# Patient Record
Sex: Female | Born: 1955
Health system: Southern US, Community
[De-identification: ages and names within clinical notes are randomized; demographics above are authoritative.]

## PROBLEM LIST (undated history)

## (undated) HISTORY — PX: CERVICAL DISCECTOMY: SHX98

## (undated) HISTORY — PX: TONSILLECTOMY: SUR1361

## (undated) HISTORY — PX: ABDOMINAL HYSTERECTOMY: SHX81

---

## 2005-09-01 ENCOUNTER — Ambulatory Visit: Payer: Self-pay | Admitting: Family Medicine

## 2006-10-18 ENCOUNTER — Ambulatory Visit: Payer: Self-pay | Admitting: Family Medicine

## 2009-04-03 ENCOUNTER — Ambulatory Visit: Payer: Self-pay | Admitting: Family Medicine

## 2009-12-22 LAB — LIPID PANEL: LDL CALC: 159 mg/dL

## 2010-06-12 LAB — LIPID PANEL
CHOLESTEROL: 189 mg/dL (ref 0–200)
HDL: 46 mg/dL (ref 35–70)
TRIGLYCERIDES: 83 mg/dL (ref 40–160)

## 2010-12-08 ENCOUNTER — Other Ambulatory Visit (HOSPITAL_COMMUNITY): Payer: Self-pay | Admitting: Family Medicine

## 2010-12-08 DIAGNOSIS — IMO0002 Reserved for concepts with insufficient information to code with codable children: Secondary | ICD-10-CM

## 2010-12-15 ENCOUNTER — Inpatient Hospital Stay (HOSPITAL_COMMUNITY): Admission: RE | Admit: 2010-12-15 | Payer: Self-pay | Source: Ambulatory Visit

## 2010-12-15 ENCOUNTER — Encounter (HOSPITAL_COMMUNITY): Payer: Self-pay

## 2012-07-09 DIAGNOSIS — E039 Hypothyroidism, unspecified: Secondary | ICD-10-CM | POA: Insufficient documentation

## 2012-08-06 ENCOUNTER — Telehealth: Payer: Self-pay | Admitting: *Deleted

## 2012-08-08 NOTE — Telephone Encounter (Signed)
Opened in error

## 2013-03-27 LAB — HEPATIC FUNCTION PANEL
ALT: 23 U/L (ref 7–35)
AST: 26 U/L (ref 13–35)
Alkaline Phosphatase: 112 U/L (ref 25–125)
BILIRUBIN, TOTAL: 0.3 mg/dL

## 2013-03-27 LAB — BASIC METABOLIC PANEL
BUN: 10 mg/dL (ref 4–21)
Creatinine: 1.2 mg/dL — AB (ref <=1.1)
Glucose: 95 mg/dL
POTASSIUM: 4.5 mmol/L (ref 3.4–5.3)
Sodium: 137 mmol/L (ref 137–147)

## 2013-04-22 ENCOUNTER — Ambulatory Visit: Payer: Self-pay | Admitting: Family Medicine

## 2013-10-01 LAB — CBC AND DIFFERENTIAL
HEMATOCRIT: 38 % (ref 36–46)
Hemoglobin: 12.9 g/dL (ref 12.0–16.0)
Neutrophils Absolute: 62 /uL
Platelets: 260 10*3/uL (ref 150–399)
WBC: 5.5 10^3/mL

## 2013-10-01 LAB — TSH: TSH: 1 u[IU]/mL (ref <=5.90)

## 2013-10-31 ENCOUNTER — Emergency Department: Payer: Self-pay | Admitting: Emergency Medicine

## 2013-10-31 LAB — URINALYSIS, COMPLETE
BILIRUBIN, UR: NEGATIVE
Bacteria: NONE SEEN
Blood: NEGATIVE
Glucose,UR: NEGATIVE mg/dL (ref 0–75)
LEUKOCYTE ESTERASE: NEGATIVE
NITRITE: NEGATIVE
PH: 6 (ref 4.5–8.0)
Protein: NEGATIVE
RBC,UR: 1 /HPF (ref 0–5)
Specific Gravity: 1.005 (ref 1.003–1.030)

## 2013-10-31 LAB — CBC
HCT: 40.1 % (ref 35.0–47.0)
HGB: 13.5 g/dL (ref 12.0–16.0)
MCH: 30.3 pg (ref 26.0–34.0)
MCHC: 33.7 g/dL (ref 32.0–36.0)
MCV: 90 fL (ref 80–100)
PLATELETS: 228 10*3/uL (ref 150–440)
RBC: 4.46 10*6/uL (ref 3.80–5.20)
RDW: 13.2 % (ref 11.5–14.5)
WBC: 7.6 10*3/uL (ref 3.6–11.0)

## 2013-10-31 LAB — COMPREHENSIVE METABOLIC PANEL
ANION GAP: 12 (ref 7–16)
Albumin: 3.8 g/dL (ref 3.4–5.0)
Alkaline Phosphatase: 78 U/L
BUN: 11 mg/dL (ref 7–18)
Bilirubin,Total: 0.7 mg/dL (ref 0.2–1.0)
CALCIUM: 9.4 mg/dL (ref 8.5–10.1)
CO2: 21 mmol/L (ref 21–32)
Chloride: 107 mmol/L (ref 98–107)
Creatinine: 1.39 mg/dL — ABNORMAL HIGH (ref 0.60–1.30)
GFR CALC AF AMER: 50 — AB
GFR CALC NON AF AMER: 42 — AB
GLUCOSE: 88 mg/dL (ref 65–99)
Osmolality: 278 (ref 275–301)
Potassium: 3.6 mmol/L (ref 3.5–5.1)
SGOT(AST): 20 U/L (ref 15–37)
SGPT (ALT): 15 U/L
SODIUM: 140 mmol/L (ref 136–145)
TOTAL PROTEIN: 7.3 g/dL (ref 6.4–8.2)

## 2013-10-31 LAB — TROPONIN I

## 2014-06-23 DIAGNOSIS — F428 Other obsessive-compulsive disorder: Secondary | ICD-10-CM | POA: Insufficient documentation

## 2014-06-23 DIAGNOSIS — E559 Vitamin D deficiency, unspecified: Secondary | ICD-10-CM | POA: Insufficient documentation

## 2014-06-23 DIAGNOSIS — E7800 Pure hypercholesterolemia, unspecified: Secondary | ICD-10-CM | POA: Insufficient documentation

## 2014-06-23 DIAGNOSIS — E538 Deficiency of other specified B group vitamins: Secondary | ICD-10-CM | POA: Insufficient documentation

## 2014-06-23 DIAGNOSIS — F329 Major depressive disorder, single episode, unspecified: Secondary | ICD-10-CM | POA: Insufficient documentation

## 2014-06-23 DIAGNOSIS — F419 Anxiety disorder, unspecified: Secondary | ICD-10-CM | POA: Insufficient documentation

## 2014-06-23 DIAGNOSIS — M2550 Pain in unspecified joint: Secondary | ICD-10-CM | POA: Insufficient documentation

## 2014-06-23 DIAGNOSIS — F32A Depression, unspecified: Secondary | ICD-10-CM | POA: Insufficient documentation

## 2014-06-23 DIAGNOSIS — M5412 Radiculopathy, cervical region: Secondary | ICD-10-CM | POA: Insufficient documentation

## 2014-06-23 DIAGNOSIS — I341 Nonrheumatic mitral (valve) prolapse: Secondary | ICD-10-CM | POA: Insufficient documentation

## 2014-06-23 DIAGNOSIS — G4700 Insomnia, unspecified: Secondary | ICD-10-CM | POA: Insufficient documentation

## 2014-06-23 DIAGNOSIS — E039 Hypothyroidism, unspecified: Secondary | ICD-10-CM | POA: Insufficient documentation

## 2014-06-23 DIAGNOSIS — D649 Anemia, unspecified: Secondary | ICD-10-CM | POA: Insufficient documentation

## 2014-07-01 DIAGNOSIS — R102 Pelvic and perineal pain: Secondary | ICD-10-CM

## 2014-07-01 DIAGNOSIS — G8929 Other chronic pain: Secondary | ICD-10-CM | POA: Insufficient documentation

## 2014-08-05 ENCOUNTER — Other Ambulatory Visit: Payer: Self-pay | Admitting: Family Medicine

## 2014-08-05 DIAGNOSIS — M2550 Pain in unspecified joint: Secondary | ICD-10-CM

## 2014-08-05 MED ORDER — OXYCODONE HCL 10 MG PO TABS: 10.0000 mg | ORAL_TABLET | ORAL | Status: DC | PRN

## 2014-08-05 NOTE — Telephone Encounter (Signed)
[email or web address]

## 2014-08-05 NOTE — Telephone Encounter (Signed)
Pt contacted office for refill request on the following medications:  Oxycodone HCl 10 MG TABS.  ZO#[phone]/WJB#[phone]/MJ   This is a Dr Sullivan LoneGilbert pt.

## 2014-08-05 NOTE — Telephone Encounter (Signed)
Prescription printed. Please notify patient it is ready for pick up. Thanks- Dr. Elorah Dewing.  

## 2014-08-15 ENCOUNTER — Telehealth: Payer: Self-pay | Admitting: Family Medicine

## 2014-08-20 ENCOUNTER — Ambulatory Visit (INDEPENDENT_AMBULATORY_CARE_PROVIDER_SITE_OTHER): Payer: Medicare PPO | Admitting: Family Medicine

## 2014-08-20 ENCOUNTER — Encounter: Payer: Self-pay | Admitting: Family Medicine

## 2014-08-20 VITALS — BP 128/74 | HR 68 | Resp 16 | Ht 66.0 in | Wt 175.0 lb

## 2014-08-20 DIAGNOSIS — J0191 Acute recurrent sinusitis, unspecified: Secondary | ICD-10-CM | POA: Diagnosis not present

## 2014-08-20 DIAGNOSIS — F32A Depression, unspecified: Secondary | ICD-10-CM

## 2014-08-20 DIAGNOSIS — F329 Major depressive disorder, single episode, unspecified: Secondary | ICD-10-CM | POA: Diagnosis not present

## 2014-08-20 MED ORDER — CLARITHROMYCIN 500 MG PO TABS: 500.0000 mg | ORAL_TABLET | Freq: Two times a day (BID) | ORAL | Status: DC

## 2014-08-20 NOTE — Progress Notes (Signed)
Patient ID: Hannah Vance, female   DOB: 07/18/1955, 59 y.o.   MRN: [id number]   Hannah Vance  MRN: [id number] DOB: 1956/01/25  Subjective:  HPI  1. Depression Patient is a 59 year old female who presents for follow up of depression.  She has been seeing Dr. Imogene Burn.  She reports that she is on new medications and states she is feeling so much better.  She states she still has some bad days but overall she feels well.    2. Acute recurrent sinusitis, unspecified location Patient has been having recurrent sinus infections.  This current episode started 3 weeks ago.  She has been treated with antibiotics on 2 separate occasion.  She complains of sinus pain and pressure.  She also complains tht her ears are bothering her.    Patient Active Problem List   Diagnosis Date Noted  . Absolute anemia 06/23/2014  . Anxiety 06/23/2014  . Pain in joint 06/23/2014  . 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 Clinical depression 06/23/2014  . Hypercholesteremia 06/23/2014  . Adult hypothyroidism 06/23/2014  . Cannot sleep 06/23/2014  . Billowing mitral valve 06/23/2014  . Anancastic neurosis 06/23/2014  . Avitaminosis D 06/23/2014    History reviewed. No pertinent past medical history.  History   Social History  . Marital Status: Divorced    Spouse Name: N/A  . Number of Children: N/A  . Years of Education: N/A   Occupational History  . Not on file.   Social History Main Topics  . Smoking status: Never Smoker   . Smokeless tobacco: Not on file  . Alcohol Use: 0.0 oz/week    0 Standard drinks or equivalent per week  . Drug Use: No  . Sexual Activity: Not on file   Other Topics Concern  . Not on file   Social History Narrative    Outpatient Prescriptions Prior to Visit  Medication Sig Dispense Refill  . aspirin 81 MG EC tablet Take by mouth.    Marland Kitchen L-Methylfolate (DEPLIN) 15 MG TABS Take by mouth.    . levothyroxine  (SYNTHROID, LEVOTHROID) 25 MCG tablet Take by mouth.    Marland Kitchen LORazepam (ATIVAN) 1 MG tablet Take by mouth.    . Oxycodone HCl 10 MG TABS Take 1 tablet (10 mg total) by mouth every 4 (four) hours as needed. 200 tablet 0  . zolpidem (AMBIEN) 10 MG tablet Take by mouth.    Marland Kitchen buPROPion (WELLBUTRIN SR) 200 MG 12 hr tablet Take by mouth.    . desvenlafaxine (PRISTIQ) 50 MG 24 hr tablet Take by mouth.     No facility-administered medications prior to visit.    Allergies  Allergen Reactions  . Amoxicillin   . Morphine Other (See Comments)    Trouble walking, heavy legs  . Sulfa Antibiotics Other (See Comments)    burning  . Septra  [Sulfamethoxazole-Trimethoprim]   . Methadone Nausea And Vomiting    Review of Systems  Constitutional: Negative for fever, chills, weight loss, malaise/fatigue and diaphoresis.  HENT: Positive for congestion, ear discharge and ear pain. Negative for hearing loss, nosebleeds, sore throat and tinnitus.   Eyes: Positive for pain and discharge. Negative for redness.  Respiratory: Negative.  Negative for cough, hemoptysis, sputum production, shortness of breath and wheezing.   Cardiovascular: Positive for palpitations. Negative for chest pain, orthopnea, claudication, leg swelling and PND.  Gastrointestinal: Negative.   Genitourinary: Negative.   Musculoskeletal: Positive for back pain.  Neurological: Positive for weakness and headaches (sinus pressure and pain).  Endo/Heme/Allergies: Negative.   Psychiatric/Behavioral: Positive for depression and memory loss. Negative for hallucinations and substance abuse. The patient is nervous/anxious and has insomnia.    Objective:  BP 128/74 mmHg  Pulse 68  Resp 16  Ht 5\' 6"  (1.676 m)  Wt 175 lb (79.379 kg)  BMI 28.26 kg/m2  Physical Exam  Constitutional: She is oriented to person, place, and time and well-developed, well-nourished, and in no distress.  HENT:  Head: Normocephalic and atraumatic.  Right Ear: External  ear normal.  Left Ear: External ear normal.  Nose: Nose normal.  Eyes: Conjunctivae are normal. Pupils are equal, round, and reactive to light.  Neck: Normal range of motion. Neck supple.  Cardiovascular: Normal rate, regular rhythm and normal heart sounds.   Pulmonary/Chest: Effort normal and breath sounds normal.  Abdominal: Soft. Bowel sounds are normal.  Neurological: She is alert and oriented to person, place, and time.  Skin: Skin is warm and dry.  Psychiatric: Mood, memory, affect and judgment normal.    Assessment and Plan :  Depression   markedly improved with the stimulant. She was treated several years ago with Provigil when this was I think her depression going forward should always include a stimulant as part of the regimen. Assuming that This is safe for the patient. This is probably the best she felt than 10-15 years. [facility] Chronic sinusitis Biaxin for at least 10 days and may need ENT referral if she does not resolve.  Julieanne Mansonichard Gilbert MD [facility] 08/20/2014 12:19 PM

## 2014-09-15 ENCOUNTER — Other Ambulatory Visit: Payer: Self-pay | Admitting: Family Medicine

## 2014-11-03 ENCOUNTER — Ambulatory Visit (INDEPENDENT_AMBULATORY_CARE_PROVIDER_SITE_OTHER): Payer: Medicare PPO | Admitting: Family Medicine

## 2014-11-03 ENCOUNTER — Encounter: Payer: Self-pay | Admitting: Family Medicine

## 2014-11-03 VITALS — BP 124/68 | HR 68 | Temp 98.9°F | Resp 14 | Wt 178.0 lb

## 2014-11-03 DIAGNOSIS — Z23 Encounter for immunization: Secondary | ICD-10-CM

## 2014-11-03 NOTE — Progress Notes (Signed)
Patient ID: Hannah Vance, female   DOB: 30-Dec-1955, 59 y.o.   MRN: [id number]    Subjective:  HPI  Depression follow up: Patient is here for 2 months follow up. Patient was doing well and saw Dr. Imogene Burn on Thursday September 22nd and her medications were changed. She can not tell the difference as of right now.  Patient is feeling more depressed today.  Prior to Admission medications   Medication Sig Start Date End Date Taking? Authorizing Provider  aspirin 81 MG EC tablet Take by mouth.   Yes Historical Provider, MD  clarithromycin (BIAXIN) 500 MG tablet Take 1 tablet (500 mg total) by mouth 2 (two) times daily. 08/20/14  Yes Rain Wilhide Hulen Shouts., MD  L-Methylfolate (DEPLIN) 15 MG TABS Take by mouth.   Yes Historical Provider, MD  levothyroxine (SYNTHROID, LEVOTHROID) 25 MCG tablet Take by mouth. 06/16/14  Yes Historical Provider, MD  LORazepam (ATIVAN) 1 MG tablet Take by mouth.   Yes Historical Provider, MD  methylphenidate (RITALIN) 10 MG tablet Take 10 mg by mouth 2 (two) times daily. Patient is uncertain of the strength   Yes Historical Provider, MD  Oxycodone HCl 10 MG TABS Take 1 tablet (10 mg total) by mouth every 4 (four) hours as needed. 08/05/14  Yes Lorie Phenix, MD  Vortioxetine HBr (BRINTELLIX) 10 MG TABS Take 1 tablet by mouth daily. Patient is uncertain of the strength   Yes Historical Provider, MD  zolpidem (AMBIEN) 10 MG tablet TAKE 1 TABLET AT BEDTIME AS NEEDED FOR SLEEP 09/16/14  Yes Maple Hudson., MD    Patient Active Problem List   Diagnosis Date Noted  . Absolute anemia 06/23/2014  . Anxiety 06/23/2014  . Pain in joint 06/23/2014  . 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 Clinical depression 06/23/2014  . Hypercholesteremia 06/23/2014  . Adult hypothyroidism 06/23/2014  . Cannot sleep 06/23/2014  . Billowing mitral valve 06/23/2014  . Anancastic neurosis 06/23/2014  . Avitaminosis D 06/23/2014    No  past medical history on file.  Social History   Social History  . Marital Status: Divorced    Spouse Name: N/A  . Number of Children: N/A  . Years of Education: N/A   Occupational History  . Not on file.   Social History Main Topics  . Smoking status: Never Smoker   . Smokeless tobacco: Never Used  . Alcohol Use: 0.0 oz/week    0 Standard drinks or equivalent per week  . Drug Use: No  . Sexual Activity: No   Other Topics Concern  . Not on file   Social History Narrative    Allergies  Allergen Reactions  . Amoxicillin   . Morphine Other (See Comments)    Trouble walking, heavy legs  . Sulfa Antibiotics Other (See Comments)    burning  . Septra  [Sulfamethoxazole-Trimethoprim]   . Methadone Nausea And Vomiting    Review of Systems  Constitutional: Negative.   Respiratory: Negative.   Cardiovascular: Negative.   Gastrointestinal: Positive for nausea.  Musculoskeletal: Positive for back pain and joint pain.  Psychiatric/Behavioral: Positive for depression. The patient is nervous/anxious.      There is no immunization history on file for this patient. Objective:  BP 124/68 mmHg  Pulse 68  Temp(Src) 98.9 F (37.2 C)  Resp 14  Wt 178 lb (80.74 kg)  Physical Exam  Constitutional: She is oriented to person, place, and time and well-developed, well-nourished, and in  no distress.  HENT:  Head: Normocephalic and atraumatic.  Right Ear: External ear normal.  Left Ear: External ear normal.  Nose: Nose normal.  Eyes: Conjunctivae are normal.  Neck: Neck supple.  Cardiovascular: Normal rate, regular rhythm and normal heart sounds.   Pulmonary/Chest: Effort normal and breath sounds normal.  Abdominal: Soft.  Neurological: She is alert and oriented to person, place, and time.  Skin: Skin is warm and dry.  Psychiatric: Mood, memory, affect and judgment normal.    Lab Results  Component Value Date   WBC 7.6 10/31/2013   HGB 13.5 10/31/2013   HCT 40.1  10/31/2013   PLT 228 10/31/2013   GLUCOSE 88 10/31/2013   CHOL 189 06/12/2010   TRIG 83 06/12/2010   HDL 46 06/12/2010   LDLCALC 159 12/22/2009   TSH 1.00 10/01/2013    CMP     Component Value Date/Time   NA 140 10/31/2013 1648   NA 137 03/27/2013   K 3.6 10/31/2013 1648   K 4.5 03/27/2013   CL 107 10/31/2013 1648   CO2 21 10/31/2013 1648   GLUCOSE 88 10/31/2013 1648   BUN 11 10/31/2013 1648   BUN 10 03/27/2013   CREATININE 1.39* 10/31/2013 1648   CREATININE 1.2* 03/27/2013   CALCIUM 9.4 10/31/2013 1648   PROT 7.3 10/31/2013 1648   ALBUMIN 3.8 10/31/2013 1648   AST 20 10/31/2013 1648   AST 26 03/27/2013   ALT 15 10/31/2013 1648   ALT 23 03/27/2013   ALKPHOS 78 10/31/2013 1648   ALKPHOS 112 03/27/2013   BILITOT 0.7 10/31/2013 1648   GFRNONAA 42* 10/31/2013 1648   GFRAA 50* 10/31/2013 1648    Assessment and Plan :  1. Need for influenza vaccination  - Flu Vaccine QUAD 36+ mos IM 2. Major depressive disorder Patient continues to stay very upset about family situation. Her father died this past year and her mother is in failing health. She does not like how her sister's handle things and this bothers her. We discussed that she can only control her behavior. I really think she would benefit from counseling.we will take over her depression unless this gets worse. Dr. Imogene Burn is evidently leaving town.she is always responded in the past as stimulants. May need to increase her Ritalin.more than half the time spent in counseling with this patient. 3. Chronic pain syndrome 4. Hypothyroidism  [name] MD [facility] 11/03/2014 1:47 PM

## 2014-12-01 ENCOUNTER — Telehealth: Payer: Self-pay | Admitting: Family Medicine

## 2014-12-01 DIAGNOSIS — M2550 Pain in unspecified joint: Secondary | ICD-10-CM

## 2014-12-01 MED ORDER — OXYCODONE HCL 10 MG PO TABS: 10.0000 mg | ORAL_TABLET | ORAL | Status: DC | PRN

## 2014-12-01 NOTE — Telephone Encounter (Signed)
Pt contacted office for refill request on the following medications: Oxycodone HCl 10 MG TABS. This is a pt of Dr. Elisabeth CaraGilbert's. Thanks TNP

## 2014-12-01 NOTE — Telephone Encounter (Signed)
Please see below thank you-aa 

## 2014-12-15 ENCOUNTER — Other Ambulatory Visit: Payer: Self-pay | Admitting: Family Medicine

## 2014-12-24 ENCOUNTER — Ambulatory Visit (INDEPENDENT_AMBULATORY_CARE_PROVIDER_SITE_OTHER): Payer: Medicare PPO | Admitting: Family Medicine

## 2014-12-24 VITALS — BP 124/86 | HR 84 | Resp 16 | Wt 185.0 lb

## 2014-12-24 DIAGNOSIS — F419 Anxiety disorder, unspecified: Secondary | ICD-10-CM

## 2014-12-24 DIAGNOSIS — F329 Major depressive disorder, single episode, unspecified: Secondary | ICD-10-CM | POA: Diagnosis not present

## 2014-12-24 DIAGNOSIS — F32A Depression, unspecified: Secondary | ICD-10-CM

## 2014-12-24 MED ORDER — VORTIOXETINE HBR 10 MG PO TABS: 1.0000 | ORAL_TABLET | Freq: Every day | ORAL | Status: DC

## 2014-12-24 MED ORDER — CLONAZEPAM 0.5 MG PO TABS: ORAL_TABLET | ORAL | Status: DC

## 2014-12-24 NOTE — Progress Notes (Signed)
Patient ID: Hannah Vance, female   DOB: 28-Dec-1955, 59 y.o.   MRN: [id number]   Hannah Vance  MRN: [id number] DOB: Jul 28, 1955  Subjective:  HPI   1. Depression The patient is a 59 year old female who presents for follow up of depression.  She has been seeing Dr. Imogene Burn and he is closing his practice and needs for [place] to evaluate her medications and follow her. We discussed this at length. Patient Active Problem List   Diagnosis Date Noted  . Absolute anemia 06/23/2014  . Anxiety 06/23/2014  . Pain in joint 06/23/2014  . 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 Clinical depression 06/23/2014  . Hypercholesteremia 06/23/2014  . Adult hypothyroidism 06/23/2014  . Cannot sleep 06/23/2014  . Billowing mitral valve 06/23/2014  . Anancastic neurosis 06/23/2014  . Avitaminosis D 06/23/2014    No past medical history on file.  Social History   Social History  . Marital Status: Divorced    Spouse Name: N/A  . Number of Children: N/A  . Years of Education: N/A   Occupational History  . Not on file.   Social History Main Topics  . Smoking status: Never Smoker   . Smokeless tobacco: Never Used  . Alcohol Use: 0.0 oz/week    0 Standard drinks or equivalent per week  . Drug Use: No  . Sexual Activity: No   Other Topics Concern  . Not on file   Social History Narrative    Outpatient Prescriptions Prior to Visit  Medication Sig Dispense Refill  . aspirin 81 MG EC tablet Take by mouth.    Marland Kitchen L-Methylfolate (DEPLIN) 15 MG TABS Take by mouth.    . levothyroxine (SYNTHROID, LEVOTHROID) 25 MCG tablet TAKE 1 TABLET BY MOUTH DAILY 30 tablet 0  . methylphenidate (RITALIN) 10 MG tablet Take 10 mg by mouth 2 (two) times daily. Patient is uncertain of the strength    . Oxycodone HCl 10 MG TABS Take 1 tablet (10 mg total) by mouth every 4 (four) hours as needed. 200 tablet 0  . Vortioxetine HBr (BRINTELLIX) 10 MG TABS Take 1 tablet by  mouth daily. Patient is uncertain of the strength    . zolpidem (AMBIEN) 10 MG tablet TAKE 1 TABLET AT BEDTIME AS NEEDED FOR SLEEP 30 tablet 5  . clarithromycin (BIAXIN) 500 MG tablet Take 1 tablet (500 mg total) by mouth 2 (two) times daily. 20 tablet 2  . LORazepam (ATIVAN) 1 MG tablet Take by mouth.     No facility-administered medications prior to visit.    Allergies  Allergen Reactions  . Amoxicillin   . Morphine Other (See Comments)    Trouble walking, heavy legs  . Sulfa Antibiotics Other (See Comments)    burning  . Septra  [Sulfamethoxazole-Trimethoprim]   . Methadone Nausea And Vomiting    Review of Systems  Constitutional: Positive for malaise/fatigue. Negative for fever, chills, weight loss and diaphoresis.  Respiratory: Positive for shortness of breath. Negative for cough, hemoptysis, sputum production and wheezing.   Cardiovascular: Positive for palpitations and leg swelling. Negative for chest pain and orthopnea.  Neurological: Positive for dizziness, weakness and headaches.   Objective:  BP 124/86 mmHg  Pulse 84  Resp 16  Wt 185 lb (83.915 kg)  Physical Exam  Assessment and Plan :  1. Depression Patient is going to d/c her Deplin - Vortioxetine HBr (TRINTELLIX) 10 MG TABS; Take 1 tablet (10 mg total) by mouth  daily.  Dispense: 30 tablet; Refill: 12 More than 50% of time spent in counselling. 2. Acute anxiety Will switch her Lorazepam to Clonazepam - clonazePAM (KLONOPIN) 0.5 MG tablet; 1-2 three times daily  Dispense: 180 tablet; Refill: 5   Julieanne Mansonichard Gilbert MD [facility] 12/24/2014 4:46 PM

## 2014-12-29 ENCOUNTER — Telehealth: Payer: Self-pay | Admitting: Family Medicine

## 2014-12-29 DIAGNOSIS — J309 Allergic rhinitis, unspecified: Secondary | ICD-10-CM

## 2014-12-29 NOTE — Telephone Encounter (Signed)
Pt called and is having sinus congestion, sneezing, she is using simply saline.  Is there anything else she can take.  Either RX or OTC.  [facility]  Call back is  [phone]  Thanks Barth Kirkseri

## 2014-12-29 NOTE — Telephone Encounter (Signed)
Any suggestions?-aa

## 2014-12-30 MED ORDER — FLUTICASONE PROPIONATE 50 MCG/ACT NA SUSP: 2.0000 | Freq: Every day | NASAL | Status: DC

## 2014-12-30 NOTE — Telephone Encounter (Signed)
Fluticasone nasal spray

## 2014-12-30 NOTE — Telephone Encounter (Signed)
Med sent in. Pt informed. 

## 2015-01-13 ENCOUNTER — Ambulatory Visit (INDEPENDENT_AMBULATORY_CARE_PROVIDER_SITE_OTHER): Payer: Medicare PPO | Admitting: Family Medicine

## 2015-01-13 VITALS — BP 140/86 | HR 88 | Temp 98.8°F | Resp 16 | Wt 183.0 lb

## 2015-01-13 DIAGNOSIS — F32A Depression, unspecified: Secondary | ICD-10-CM

## 2015-01-13 DIAGNOSIS — M2550 Pain in unspecified joint: Secondary | ICD-10-CM | POA: Diagnosis not present

## 2015-01-13 DIAGNOSIS — F329 Major depressive disorder, single episode, unspecified: Secondary | ICD-10-CM | POA: Diagnosis not present

## 2015-01-13 MED ORDER — METHYLPHENIDATE HCL 10 MG PO TABS: 10.0000 mg | ORAL_TABLET | Freq: Two times a day (BID) | ORAL | Status: DC

## 2015-01-13 MED ORDER — OXYCODONE HCL 10 MG PO TABS: 10.0000 mg | ORAL_TABLET | ORAL | Status: DC | PRN

## 2015-01-13 MED ORDER — LEVOTHYROXINE SODIUM 25 MCG PO TABS: 25.0000 ug | ORAL_TABLET | Freq: Every day | ORAL | Status: DC

## 2015-01-13 NOTE — Progress Notes (Signed)
Patient ID: Hannah Vance, female   DOB: 1955/02/19, 59 y.o.   MRN: [id number]   ADELYNA BROCKMAN  MRN: [id number] DOB: Feb 16, 1955  Subjective:  HPI   1. Depression The patient is a 59 year old female who is here for follow up on her depression.  She was last seen on 12/24/14.  At that time it was decided that she would discontinue her Deplin and start Trintellix.  She also had her Lorazepam switched to Clonazepam.  She reports that she is tolerating these changes well.  Patient Active Problem List   Diagnosis Date Noted  . Absolute anemia 06/23/2014  . Anxiety 06/23/2014  . Pain in joint 06/23/2014  . 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 Clinical depression 06/23/2014  . Hypercholesteremia 06/23/2014  . Adult hypothyroidism 06/23/2014  . Cannot sleep 06/23/2014  . Billowing mitral valve 06/23/2014  . Anancastic neurosis 06/23/2014  . Avitaminosis D 06/23/2014    No past medical history on file.  Social History   Social History  . Marital Status: Divorced    Spouse Name: N/A  . Number of Children: N/A  . Years of Education: N/A   Occupational History  . Not on file.   Social History Main Topics  . Smoking status: Never Smoker   . Smokeless tobacco: Never Used  . Alcohol Use: 0.0 oz/week    0 Standard drinks or equivalent per week  . Drug Use: No  . Sexual Activity: No   Other Topics Concern  . Not on file   Social History Narrative    Outpatient Prescriptions Prior to Visit  Medication Sig Dispense Refill  . aspirin 81 MG EC tablet Take by mouth.    . clonazePAM (KLONOPIN) 0.5 MG tablet 1-2 three times daily 180 tablet 5  . fluticasone (FLONASE) 50 MCG/ACT nasal spray Place 2 sprays into both nostrils daily. 16 g 12  . levothyroxine (SYNTHROID, LEVOTHROID) 25 MCG tablet TAKE 1 TABLET BY MOUTH DAILY 30 tablet 0  . methylphenidate (RITALIN) 10 MG tablet Take 10 mg by mouth 2 (two) times daily. Patient is  uncertain of the strength    . Oxycodone HCl 10 MG TABS Take 1 tablet (10 mg total) by mouth every 4 (four) hours as needed. 200 tablet 0  . Vortioxetine HBr (TRINTELLIX) 10 MG TABS Take 1 tablet (10 mg total) by mouth daily. 30 tablet 12  . zolpidem (AMBIEN) 10 MG tablet TAKE 1 TABLET AT BEDTIME AS NEEDED FOR SLEEP 30 tablet 5   No facility-administered medications prior to visit.    Allergies  Allergen Reactions  . Amoxicillin   . Morphine Other (See Comments)    Trouble walking, heavy legs  . Sulfa Antibiotics Other (See Comments)    burning  . Septra  [Sulfamethoxazole-Trimethoprim]   . Methadone Nausea And Vomiting    Review of Systems  Constitutional: Negative for fever and chills.  Respiratory: Negative for shortness of breath and wheezing.   Cardiovascular: Negative for chest pain and orthopnea.  Gastrointestinal: Positive for abdominal pain.  Musculoskeletal: Positive for back pain.  Neurological: Negative for weakness.  Psychiatric/Behavioral: Positive for depression. Negative for suicidal ideas, hallucinations, memory loss and substance abuse. The patient is nervous/anxious. The patient does not have insomnia.    Objective:  BP 140/86 mmHg  Pulse 88  Temp(Src) 98.8 F (37.1 C) (Oral)  Resp 16  Wt 183 lb (83.008 kg)  Physical Exam  Constitutional: She is well-developed, well-nourished,  and in no distress.  HENT:  Head: Normocephalic.  Eyes: Pupils are equal, round, and reactive to light.  Neck: Normal range of motion.  Cardiovascular: Normal rate, regular rhythm and normal heart sounds.   Pulmonary/Chest: Effort normal and breath sounds normal.  Abdominal: Soft. There is tenderness.  Normal tenderness for this pt in the abdominal wall.  Skin: Skin is warm and dry.  Psychiatric: Mood, memory, affect and judgment normal.    Assessment and Plan :  1. Depression Will follow for now. - levothyroxine (SYNTHROID, LEVOTHROID) 25 MCG tablet; Take 1 tablet (25 mcg  total) by mouth daily.  Dispense: 30 tablet; Refill: 12 - methylphenidate (RITALIN) 10 MG tablet; Take 1 tablet (10 mg total) by mouth 2 (two) times daily. Patient is uncertain of the strength  Dispense: 60 tablet; Refill: 0  2. Pain in joint Chronic pain requiring less narcotic over time - Oxycodone HCl 10 MG TABS; Take 1 tablet (10 mg total) by mouth every 4 (four) hours as needed.  Dispense: 200 tablet; Refill: 0 3.chronic Abdominal Pain/Tenderness 4.Chronic Anxiety Major issue for pt. May need referral back to a new psychiatrist in future. Mor than 50% of visit spent in counselling.   Julieanne Mansonichard Kaliope Quinonez MD [facility] 01/13/2015 2:51 PM

## 2015-01-14 ENCOUNTER — Other Ambulatory Visit: Payer: Self-pay | Admitting: Family Medicine

## 2015-01-26 ENCOUNTER — Telehealth: Payer: Self-pay | Admitting: Family Medicine

## 2015-01-26 NOTE — Telephone Encounter (Signed)
Sucralfate ok to take.

## 2015-01-26 NOTE — Telephone Encounter (Signed)
Pt informed and voiced understanding of results. 

## 2015-01-26 NOTE — Telephone Encounter (Signed)
Pt called saying she has been having stomach problems and migraines lately because of stress.  She was taking generic sucralfate 1mg  for her stomach and still has some at home but doesn't know whether she can take it or not because  Dr. Claudie Fishermanhin has her on mediation and she is afraid of an interaction.    Could someone please call her back.  [phone]  Thanks, Barth Kirkseri

## 2015-01-26 NOTE — Telephone Encounter (Signed)
Please review,-aa 

## 2015-02-24 ENCOUNTER — Encounter: Payer: Self-pay | Admitting: Family Medicine

## 2015-03-03 ENCOUNTER — Ambulatory Visit: Payer: PPO | Admitting: Family Medicine

## 2015-03-05 ENCOUNTER — Other Ambulatory Visit: Payer: Self-pay

## 2015-03-05 ENCOUNTER — Telehealth: Payer: Self-pay | Admitting: Family Medicine

## 2015-03-05 DIAGNOSIS — F329 Major depressive disorder, single episode, unspecified: Secondary | ICD-10-CM

## 2015-03-05 DIAGNOSIS — F32A Depression, unspecified: Secondary | ICD-10-CM

## 2015-03-05 MED ORDER — VORTIOXETINE HBR 20 MG PO TABS: 20.0000 mg | ORAL_TABLET | Freq: Every day | ORAL | Status: DC

## 2015-03-05 NOTE — Telephone Encounter (Signed)
Spoke with patient, not sure who called could be the appointment notification phone call. No messages in the chart. -aa

## 2015-03-05 NOTE — Telephone Encounter (Signed)
Pt states she's returning a call  from one of Dr. Wonda Olds nurse's maybe from [place],pt really can't remember who, pt states she  thinks it might be about her medication,pt states she would like for Ana to return her call.  Thanks CC

## 2015-03-05 NOTE — Telephone Encounter (Signed)
She called and spoke with insurance and they will send [place] the form to get her medication approved for Methalphenidate. Awaiting forms to come over.-aa

## 2015-03-05 NOTE — Telephone Encounter (Signed)
Pt is request a call back from Tobi Bastos today if possible .  ZO#[phone]/[id number]

## 2015-03-09 ENCOUNTER — Ambulatory Visit (INDEPENDENT_AMBULATORY_CARE_PROVIDER_SITE_OTHER): Payer: PPO | Admitting: Family Medicine

## 2015-03-09 VITALS — BP 158/96 | HR 92 | Temp 98.1°F | Resp 18 | Wt 182.0 lb

## 2015-03-09 DIAGNOSIS — E7800 Pure hypercholesterolemia, unspecified: Secondary | ICD-10-CM | POA: Diagnosis not present

## 2015-03-09 DIAGNOSIS — G4700 Insomnia, unspecified: Secondary | ICD-10-CM

## 2015-03-09 DIAGNOSIS — M545 Low back pain, unspecified: Secondary | ICD-10-CM

## 2015-03-09 DIAGNOSIS — F419 Anxiety disorder, unspecified: Secondary | ICD-10-CM

## 2015-03-09 DIAGNOSIS — G43009 Migraine without aura, not intractable, without status migrainosus: Secondary | ICD-10-CM | POA: Diagnosis not present

## 2015-03-09 DIAGNOSIS — F329 Major depressive disorder, single episode, unspecified: Secondary | ICD-10-CM | POA: Diagnosis not present

## 2015-03-09 DIAGNOSIS — E038 Other specified hypothyroidism: Secondary | ICD-10-CM | POA: Diagnosis not present

## 2015-03-09 DIAGNOSIS — F32A Depression, unspecified: Secondary | ICD-10-CM

## 2015-03-09 MED ORDER — SUMATRIPTAN SUCCINATE 50 MG PO TABS: 50.0000 mg | ORAL_TABLET | Freq: Once | ORAL | Status: DC

## 2015-03-09 NOTE — Progress Notes (Signed)
Patient ID: Hannah Vance, female   DOB: [birth year], 60 y.o.   MRN: [id number]    Subjective:  HPI  Patient has had trouble with abdominal pain for months off and on. She states it is usually an issue when she feels constipated due to her medications she takes. She does take Miralax and when she gets the pain in her abdominal sided she will take extra Miralax. No vomiting with it.  Patient also has had a headache for 3 days now, mainly located on the left side of her head. She has a history of migraines but has not had to take medications for this in years. She does mention that her headaches got worse when Dr. Imogene Burn put her on Pristiq but due to other side effects she is no longer on that medication it was switched but she still gets headaches just not as often. She knows she had a migraine around Christmas time she had aura and everything. Headache she is having now noise and light, everything per patient is aggravating it. She has not taking anything for the pain besides Aspirin 81 mg.  Prior to Admission medications   Medication Sig Start Date End Date Taking? Authorizing Provider  aspirin 81 MG EC tablet Take by mouth.   Yes Historical Provider, MD  clonazePAM (KLONOPIN) 0.5 MG tablet 1-2 three times daily 12/24/14  Yes Doctor Sheahan Hulen Shouts., MD  fluticasone [facility]) 50 MCG/ACT nasal spray Place 2 sprays into both nostrils daily. 12/30/14  Yes Joscelynn Brutus Hulen Shouts., MD  levothyroxine (SYNTHROID, LEVOTHROID) 25 MCG tablet Take 1 tablet (25 mcg total) by mouth daily. 01/13/15  Yes Tyrel Lex Hulen Shouts., MD  levothyroxine (SYNTHROID, LEVOTHROID) 25 MCG tablet TAKE 1 TABLET BY MOUTH DAILY 01/14/15  Yes Maple Hudson., MD  methylphenidate (RITALIN) 10 MG tablet Take 1 tablet (10 mg total) by mouth 2 (two) times daily. Patient is uncertain of the strength 01/13/15  Yes Derotha Fishbaugh Hulen Shouts., MD  methylphenidate (RITALIN) 10 MG tablet Take 1 tablet (10 mg total) by mouth 2 (two) times daily.  03/16/15  Yes Elizer Bostic Hulen Shouts., MD  methylphenidate (RITALIN) 10 MG tablet Take 1 tablet (10 mg total) by mouth 2 (two) times daily. 02/13/15  Yes Tirrell Buchberger Hulen Shouts., MD  Oxycodone HCl 10 MG TABS Take 1 tablet (10 mg total) by mouth every 4 (four) hours as needed. 01/13/15  Yes Anayia Eugene Hulen Shouts., MD  Vortioxetine HBr (TRINTELLIX) 20 MG TABS Take 20 mg by mouth daily. 03/05/15  Yes Letonia Stead Hulen Shouts., MD  zolpidem (AMBIEN) 10 MG tablet TAKE 1 TABLET AT BEDTIME AS NEEDED FOR SLEEP 09/16/14  Yes Maple Hudson., MD    Patient Active Problem List   Diagnosis Date Noted  . Absolute anemia 06/23/2014  . Anxiety 06/23/2014  . Pain in joint 06/23/2014  . 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 Clinical depression 06/23/2014  . Hypercholesteremia 06/23/2014  . Adult hypothyroidism 06/23/2014  . Cannot sleep 06/23/2014  . Billowing mitral valve 06/23/2014  . Anancastic neurosis 06/23/2014  . Avitaminosis D 06/23/2014    No past medical history on file.  Social History   Social History  . Marital Status: Divorced    Spouse Name: N/A  . Number of Children: N/A  . Years of Education: N/A   Occupational History  . Not on file.   Social History Main Topics  . Smoking status: Never Smoker   .  Smokeless tobacco: Never Used  . Alcohol Use: 0.0 oz/week    0 Standard drinks or equivalent per week  . Drug Use: No  . Sexual Activity: No   Other Topics Concern  . Not on file   Social History Narrative    Allergies  Allergen Reactions  . Amoxicillin   . Morphine Other (See Comments)    Trouble walking, heavy legs  . Sulfa Antibiotics Other (See Comments)    burning  . Septra  [Sulfamethoxazole-Trimethoprim]   . Methadone Nausea And Vomiting    Review of Systems  Constitutional: Negative.   Eyes: Negative.   Respiratory: Negative.   Cardiovascular: Negative.   Gastrointestinal: Positive for nausea and abdominal  pain.  Genitourinary: Negative.   Musculoskeletal: Positive for back pain and joint pain.  Neurological: Negative.   Psychiatric/Behavioral: Positive for depression. The patient is nervous/anxious.     Immunization History  Administered Date(s) Administered  . Influenza,inj,Quad PF,36+ Mos 11/03/2014   Objective:  BP 158/96 mmHg  Pulse 92  Temp(Src) 98.1 F (36.7 C)  Resp 18  Wt 182 lb (82.555 kg)  Physical Exam  Constitutional: She is oriented to person, place, and time and well-developed, well-nourished, and in no distress.  HENT:  Head: Normocephalic and atraumatic.  Right Ear: External ear normal.  Left Ear: External ear normal.  Nose: Nose normal.  Eyes: Conjunctivae are normal. Pupils are equal, round, and reactive to light.  Neck: Normal range of motion. Neck supple.  Cardiovascular: Normal rate, regular rhythm, normal heart sounds and intact distal pulses.   No murmur heard. Pulmonary/Chest: Effort normal and breath sounds normal. No respiratory distress. She has no wheezes.  Abdominal: Soft. Bowel sounds are normal. She exhibits no distension. There is tenderness. There is no rebound.  Musculoskeletal: Normal range of motion. She exhibits no edema or tenderness.  Neurological: She is alert and oriented to person, place, and time.  Skin: Skin is warm and dry.  Psychiatric: Memory, affect and judgment normal.    Lab Results  Component Value Date   WBC 7.6 10/31/2013   HGB 13.5 10/31/2013   HCT 40.1 10/31/2013   PLT 228 10/31/2013   GLUCOSE 88 10/31/2013   CHOL 189 06/12/2010   TRIG 83 06/12/2010   HDL 46 06/12/2010   LDLCALC 159 12/22/2009   TSH 1.00 10/01/2013    CMP     Component Value Date/Time   NA 140 10/31/2013 1648   NA 137 03/27/2013   K 3.6 10/31/2013 1648   K 4.5 03/27/2013   CL 107 10/31/2013 1648   CO2 21 10/31/2013 1648   GLUCOSE 88 10/31/2013 1648   BUN 11 10/31/2013 1648   BUN 10 03/27/2013   CREATININE 1.39* 10/31/2013 1648    CREATININE 1.2* 03/27/2013   CALCIUM 9.4 10/31/2013 1648   PROT 7.3 10/31/2013 1648   ALBUMIN 3.8 10/31/2013 1648   AST 20 10/31/2013 1648   AST 26 03/27/2013   ALT 15 10/31/2013 1648   ALT 23 03/27/2013   ALKPHOS 78 10/31/2013 1648   ALKPHOS 112 03/27/2013   BILITOT 0.7 10/31/2013 1648   GFRNONAA 42* 10/31/2013 1648   GFRAA 50* 10/31/2013 1648    Assessment and Plan :  1. Nonintractable migraine, unspecified migraine type Has a history of this in the past. Worsening. Will re start Imitrex. Re check in 2 weeks. May need neurology referral. 2. Bilateral low back pain without sciatica UA is normal today. - POCT urinalysis dipstick  3. Clinical depression Stable.  4. Anxiety  5. Cannot sleep Advised patient is Ambien does not get approved and is too much to pay out of pocket patient is advised she can take Clonazepam for sleep if needed. 6.Chronic Abdominal Pain  [name] MD [facility] 03/09/2015 11:25 AM

## 2015-03-10 LAB — CBC WITH DIFFERENTIAL/PLATELET
Basophils Absolute: 0 10*3/uL (ref 0.0–0.2)
Basos: 1 %
EOS (ABSOLUTE): 0.2 10*3/uL (ref 0.0–0.4)
EOS: 3 %
HEMATOCRIT: 39.8 % (ref 34.0–46.6)
Hemoglobin: 13.7 g/dL (ref 11.1–15.9)
IMMATURE GRANULOCYTES: 0 %
Immature Grans (Abs): 0 10*3/uL (ref 0.0–0.1)
LYMPHS ABS: 1.9 10*3/uL (ref 0.7–3.1)
Lymphs: 31 %
MCH: 29.8 pg (ref 26.6–33.0)
MCHC: 34.4 g/dL (ref 31.5–35.7)
MCV: 87 fL (ref 79–97)
Monocytes Absolute: 0.3 10*3/uL (ref 0.1–0.9)
Monocytes: 5 %
NEUTROS PCT: 60 %
Neutrophils Absolute: 3.6 10*3/uL (ref 1.4–7.0)
PLATELETS: 250 10*3/uL (ref 150–379)
RBC: 4.6 x10E6/uL (ref 3.77–5.28)
RDW: 12.9 % (ref 12.3–15.4)
WBC: 6 10*3/uL (ref 3.4–10.8)

## 2015-03-10 LAB — COMPREHENSIVE METABOLIC PANEL
ALT: 11 IU/L (ref 0–32)
AST: 19 IU/L (ref 0–40)
Albumin/Globulin Ratio: 1.8 (ref 1.1–2.5)
Albumin: 4.2 g/dL (ref 3.5–5.5)
Alkaline Phosphatase: 75 IU/L (ref 39–117)
BUN/Creatinine Ratio: 9 (ref 9–23)
BUN: 9 mg/dL (ref 6–24)
Bilirubin Total: 0.4 mg/dL (ref 0.0–1.2)
CALCIUM: 9.4 mg/dL (ref 8.7–10.2)
CO2: 23 mmol/L (ref 18–29)
CREATININE: 0.98 mg/dL (ref 0.57–1.00)
Chloride: 102 mmol/L (ref 96–106)
GFR, EST AFRICAN AMERICAN: 74 mL/min/{1.73_m2} (ref >59)
GFR, EST NON AFRICAN AMERICAN: 64 mL/min/{1.73_m2} (ref >59)
Globulin, Total: 2.3 g/dL (ref 1.5–4.5)
Glucose: 102 mg/dL — ABNORMAL HIGH (ref 65–99)
Potassium: 4.3 mmol/L (ref 3.5–5.2)
Sodium: 141 mmol/L (ref 134–144)
Total Protein: 6.5 g/dL (ref 6.0–8.5)

## 2015-03-10 LAB — LIPID PANEL WITH LDL/HDL RATIO
Cholesterol, Total: 235 mg/dL — ABNORMAL HIGH (ref 100–199)
HDL: 47 mg/dL (ref >39)
LDL Calculated: 152 mg/dL — ABNORMAL HIGH (ref 0–99)
LDL/HDL RATIO: 3.2 ratio (ref 0.0–3.2)
TRIGLYCERIDES: 179 mg/dL — AB (ref 0–149)
VLDL Cholesterol Cal: 36 mg/dL (ref 5–40)

## 2015-03-10 LAB — TSH: TSH: 1.36 u[IU]/mL (ref 0.450–4.500)

## 2015-03-12 ENCOUNTER — Telehealth: Payer: Self-pay | Admitting: Family Medicine

## 2015-03-12 NOTE — Telephone Encounter (Signed)
Pt would like to make sure it will be ok to take her Oxycodone with the Imitrex. She took the Imitrex this morning about 10 am. But she is sore from her chronic pain and she has had a headache for the last 2 days. She wants to know if she should take a migraine pill or a pain pill or if it matters how long apart she takes them. Please advise.

## 2015-03-12 NOTE — Telephone Encounter (Signed)
Pt would like to speak with a nurse about what to take as far as her medications for her headaches. CB# [phone] Thanks CC

## 2015-03-13 NOTE — Telephone Encounter (Signed)
She can take 1 more Imitrex today. He can then take the oxycodone if absolutely necessary.

## 2015-03-13 NOTE — Telephone Encounter (Signed)
Pt informed. She reports that she is better today.

## 2015-03-23 ENCOUNTER — Ambulatory Visit (INDEPENDENT_AMBULATORY_CARE_PROVIDER_SITE_OTHER): Payer: PPO | Admitting: Family Medicine

## 2015-03-23 VITALS — BP 123/72 | HR 72 | Temp 98.4°F | Resp 16 | Wt 179.0 lb

## 2015-03-23 DIAGNOSIS — F329 Major depressive disorder, single episode, unspecified: Secondary | ICD-10-CM

## 2015-03-23 DIAGNOSIS — G4700 Insomnia, unspecified: Secondary | ICD-10-CM | POA: Diagnosis not present

## 2015-03-23 DIAGNOSIS — M2550 Pain in unspecified joint: Secondary | ICD-10-CM

## 2015-03-23 DIAGNOSIS — G43009 Migraine without aura, not intractable, without status migrainosus: Secondary | ICD-10-CM | POA: Diagnosis not present

## 2015-03-23 DIAGNOSIS — F32A Depression, unspecified: Secondary | ICD-10-CM

## 2015-03-23 DIAGNOSIS — F419 Anxiety disorder, unspecified: Secondary | ICD-10-CM | POA: Diagnosis not present

## 2015-03-23 MED ORDER — OXYCODONE HCL 10 MG PO TABS: 10.0000 mg | ORAL_TABLET | ORAL | Status: DC | PRN

## 2015-03-23 MED ORDER — ZOLPIDEM TARTRATE 10 MG PO TABS: 10.0000 mg | ORAL_TABLET | Freq: Every evening | ORAL | Status: DC | PRN

## 2015-03-23 NOTE — Progress Notes (Signed)
Patient ID: Hannah Vance, female   DOB: [birth year], 60 y.o.   MRN: [id number]    Subjective:  HPI  Patient is here for 2 weeks follow up.  Migraine: Patient was put on Imitrex and it subsided eventually but she only took 3 Imitrex tablets since last visit after we told patient it can cause rebound headache.   Prior to Admission medications   Medication Sig Start Date End Date Taking? Authorizing Provider  aspirin 81 MG EC tablet Take by mouth.   Yes Historical Provider, MD  clonazePAM (KLONOPIN) 0.5 MG tablet 1-2 three times daily 12/24/14  Yes Richard Hulen Shouts., MD  fluticasone [facility]) 50 MCG/ACT nasal spray Place 2 sprays into both nostrils daily. 12/30/14  Yes Richard Hulen Shouts., MD  levothyroxine (SYNTHROID, LEVOTHROID) 25 MCG tablet TAKE 1 TABLET BY MOUTH DAILY 01/14/15  Yes Maple Hudson., MD  methylphenidate (RITALIN) 10 MG tablet Take 1 tablet (10 mg total) by mouth 2 (two) times daily. Patient is uncertain of the strength 01/13/15  Yes Richard Hulen Shouts., MD  methylphenidate (RITALIN) 10 MG tablet Take 1 tablet (10 mg total) by mouth 2 (two) times daily. 03/16/15  Yes Richard Hulen Shouts., MD  methylphenidate (RITALIN) 10 MG tablet Take 1 tablet (10 mg total) by mouth 2 (two) times daily. 02/13/15  Yes Richard Hulen Shouts., MD  Oxycodone HCl 10 MG TABS Take 1 tablet (10 mg total) by mouth every 4 (four) hours as needed. 01/13/15  Yes Richard Hulen Shouts., MD  SUMAtriptan (IMITREX) 50 MG tablet Take 1 tablet (50 mg total) by mouth once. May repeat in 2 hours if headache persists or recurs. 03/09/15  Yes Richard Hulen Shouts., MD  Vortioxetine HBr (TRINTELLIX) 20 MG TABS Take 20 mg by mouth daily. 03/05/15  Yes Richard Hulen Shouts., MD  zolpidem (AMBIEN) 10 MG tablet TAKE 1 TABLET AT BEDTIME AS NEEDED FOR SLEEP 09/16/14  Yes Maple Hudson., MD    Patient Active Problem List   Diagnosis Date Noted  . Absolute anemia 06/23/2014  . Anxiety 06/23/2014  . Pain in  joint 06/23/2014  . 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 Clinical depression 06/23/2014  . Hypercholesteremia 06/23/2014  . Adult hypothyroidism 06/23/2014  . Cannot sleep 06/23/2014  . Billowing mitral valve 06/23/2014  . Anancastic neurosis 06/23/2014  . Avitaminosis D 06/23/2014    No past medical history on file.  Social History   Social History  . Marital Status: Divorced    Spouse Name: N/A  . Number of Children: N/A  . Years of Education: N/A   Occupational History  . Not on file.   Social History Main Topics  . Smoking status: Never Smoker   . Smokeless tobacco: Never Used  . Alcohol Use: 0.0 oz/week    0 Standard drinks or equivalent per week  . Drug Use: No  . Sexual Activity: No   Other Topics Concern  . Not on file   Social History Narrative    Allergies  Allergen Reactions  . Amoxicillin   . Morphine Other (See Comments)    Trouble walking, heavy legs  . Sulfa Antibiotics Other (See Comments)    burning  . Septra  [Sulfamethoxazole-Trimethoprim]   . Methadone Nausea And Vomiting    Review of Systems  Constitutional: Negative.   Respiratory: Negative.   Cardiovascular: Negative.   Musculoskeletal: Positive for myalgias, back pain, joint  pain and neck pain.  Psychiatric/Behavioral: Positive for depression. The patient is nervous/anxious and has insomnia.     Immunization History  Administered Date(s) Administered  . Influenza,inj,Quad PF,36+ Mos 11/03/2014   Objective:  BP 123/72 mmHg  Pulse 72  Temp(Src) 98.4 F (36.9 C)  Resp 16  Wt 179 lb (81.194 kg)  Physical Exam  Constitutional: She is oriented to person, place, and time and well-developed, well-nourished, and in no distress.  HENT:  Head: Normocephalic and atraumatic.  Right Ear: External ear normal.  Left Ear: External ear normal.  Nose: Nose normal.  Eyes: Conjunctivae are normal. Pupils are equal, round, and  reactive to light.  Neck: Normal range of motion. Neck supple.  Cardiovascular: Normal rate, regular rhythm, normal heart sounds and intact distal pulses.   No murmur heard. Pulmonary/Chest: Effort normal and breath sounds normal. No respiratory distress. She has no wheezes.  Abdominal: Soft.  Neurological: She is alert and oriented to person, place, and time. Gait normal.  Grossly nonfocal.  Skin: Skin is warm and dry.  Psychiatric: Affect and judgment normal. Her mood appears anxious.    Lab Results  Component Value Date   WBC 6.0 03/09/2015   HGB 13.5 10/31/2013   HCT 39.8 03/09/2015   PLT 250 03/09/2015   GLUCOSE 102* 03/09/2015   CHOL 235* 03/09/2015   TRIG 179* 03/09/2015   HDL 47 03/09/2015   LDLCALC 152* 03/09/2015   TSH 1.360 03/09/2015    CMP     Component Value Date/Time   NA 141 03/09/2015 1202   NA 140 10/31/2013 1648   K 4.3 03/09/2015 1202   K 3.6 10/31/2013 1648   CL 102 03/09/2015 1202   CL 107 10/31/2013 1648   CO2 23 03/09/2015 1202   CO2 21 10/31/2013 1648   GLUCOSE 102* 03/09/2015 1202   GLUCOSE 88 10/31/2013 1648   BUN 9 03/09/2015 1202   BUN 11 10/31/2013 1648   CREATININE 0.98 03/09/2015 1202   CREATININE 1.39* 10/31/2013 1648   CREATININE 1.2* 03/27/2013   CALCIUM 9.4 03/09/2015 1202   CALCIUM 9.4 10/31/2013 1648   PROT 6.5 03/09/2015 1202   PROT 7.3 10/31/2013 1648   ALBUMIN 4.2 03/09/2015 1202   ALBUMIN 3.8 10/31/2013 1648   AST 19 03/09/2015 1202   AST 20 10/31/2013 1648   ALT 11 03/09/2015 1202   ALT 15 10/31/2013 1648   ALKPHOS 75 03/09/2015 1202   ALKPHOS 78 10/31/2013 1648   BILITOT 0.4 03/09/2015 1202   BILITOT 0.7 10/31/2013 1648   GFRNONAA 64 03/09/2015 1202   GFRNONAA 42* 10/31/2013 1648   GFRAA 74 03/09/2015 1202   GFRAA 50* 10/31/2013 1648    Assessment and Plan :  1. Nonintractable migraine, unspecified migraine type Better. Advised patient it is safe to take Imitrex 2 to 3 tablets a week. More than 50% of time  spent in counselling. 2. Clinical depression Discussed with patient seen a counselor. Offered referral to Psychiatry at any point in time. 3. Anxiety Pt has very dysfunctional family situation and has difficulty saying no to family. SAgsain recommended counselling. 4. Cannot sleep/Chronic Insomnia  5. Chronic pain Discussed with patient the potential risks of taking Oxycodone and trying to increase the dose. I would rather not increase the dose and patient agrees and wants to keep the same dose. Refill provided. I have done the exam and reviewed the above chart and it is accurate to the best of my knowledge.   [name] MD [place] 03/23/2015 2:10 PM

## 2015-03-30 ENCOUNTER — Telehealth: Payer: Self-pay | Admitting: Family Medicine

## 2015-03-30 NOTE — Telephone Encounter (Signed)
Pt calling stating she had talked with Tobi Bastos about her medication and insurance. Pt states she would like for Tobi Bastos to call her back.  Thanks CC

## 2015-03-30 NOTE — Telephone Encounter (Signed)
im looking into this, leave it for me-ana, thanks-aa

## 2015-03-31 NOTE — Telephone Encounter (Signed)
Spoke with insurance company about Methylphenidate and they denied it but it was before we sent form in, she spoke with the supervisor and they will email the appeal department for review. Diagnoses was missed ADD on the form, they will attach that to the email and they will send some information to the patient and then if that department needs more information from [place] they will call us. Patient advised of all of this.-aa

## 2015-05-14 ENCOUNTER — Ambulatory Visit (INDEPENDENT_AMBULATORY_CARE_PROVIDER_SITE_OTHER): Payer: PPO | Admitting: Family Medicine

## 2015-05-14 VITALS — BP 118/76 | HR 92 | Resp 16 | Wt 183.0 lb

## 2015-05-14 DIAGNOSIS — F329 Major depressive disorder, single episode, unspecified: Secondary | ICD-10-CM

## 2015-05-14 DIAGNOSIS — E038 Other specified hypothyroidism: Secondary | ICD-10-CM

## 2015-05-14 DIAGNOSIS — G43009 Migraine without aura, not intractable, without status migrainosus: Secondary | ICD-10-CM

## 2015-05-14 DIAGNOSIS — F32A Depression, unspecified: Secondary | ICD-10-CM

## 2015-05-14 DIAGNOSIS — F419 Anxiety disorder, unspecified: Secondary | ICD-10-CM | POA: Diagnosis not present

## 2015-05-14 DIAGNOSIS — M2550 Pain in unspecified joint: Secondary | ICD-10-CM

## 2015-05-14 MED ORDER — METHYLPHENIDATE HCL 10 MG PO TABS: 10.0000 mg | ORAL_TABLET | Freq: Two times a day (BID) | ORAL | Status: DC

## 2015-05-14 MED ORDER — OXYCODONE HCL 10 MG PO TABS: 10.0000 mg | ORAL_TABLET | ORAL | Status: DC | PRN

## 2015-05-14 MED ORDER — SUMATRIPTAN SUCCINATE 50 MG PO TABS: 50.0000 mg | ORAL_TABLET | Freq: Once | ORAL | Status: DC

## 2015-05-14 NOTE — Progress Notes (Signed)
Patient ID: Hannah Vance, female   DOB: 1955-03-20, 60 y.o.   MRN: [id number]    Subjective:  HPI  Patient is here for follow up. Symptoms are stable/unchanged. She needs Methylphenidate, Oxycodone and Sumatriptan Refills.  She also needed a letter to be sent to IRS that states she was mentally or physical unable to do her taxes. She has not had them done since 2011.  Prior to Admission medications   Medication Sig Start Date End Date Taking? Authorizing Provider  methylphenidate (RITALIN) 10 MG tablet Take 1 tablet (10 mg total) by mouth 2 (two) times daily. Patient is uncertain of the strength 01/13/15  Yes Richard Hulen ShoutsL Gilbert Jr., MD  methylphenidate (RITALIN) 10 MG tablet Take 1 tablet (10 mg total) by mouth 2 (two) times daily. 03/16/15  Yes Richard Hulen ShoutsL Gilbert Jr., MD  methylphenidate (RITALIN) 10 MG tablet Take 1 tablet (10 mg total) by mouth 2 (two) times daily. 02/13/15  Yes Richard Hulen ShoutsL Gilbert Jr., MD  Oxycodone HCl 10 MG TABS Take 1 tablet (10 mg total) by mouth every 4 (four) hours as needed. 03/23/15  Yes Richard Hulen ShoutsL Gilbert Jr., MD  SUMAtriptan (IMITREX) 50 MG tablet Take 1 tablet (50 mg total) by mouth once. May repeat in 2 hours if headache persists or recurs. 03/09/15  Yes Richard Hulen ShoutsL Gilbert Jr., MD  aspirin 81 MG EC tablet Take by mouth.    Historical Provider, MD  clonazePAM Scarlette Calico(KLONOPIN) 0.5 MG tablet 1-2 three times daily 12/24/14   Richard Hulen ShoutsL Gilbert Jr., MD  fluticasone [facility](FLONASE) 50 MCG/ACT nasal spray Place 2 sprays into both nostrils daily. 12/30/14   Richard Hulen ShoutsL Gilbert Jr., MD  levothyroxine (SYNTHROID, LEVOTHROID) 25 MCG tablet TAKE 1 TABLET BY MOUTH DAILY 01/14/15   Maple Hudsonichard L Gilbert Jr., MD  Vortioxetine HBr (TRINTELLIX) 20 MG TABS Take 20 mg by mouth daily. 03/05/15   Richard Hulen ShoutsL Gilbert Jr., MD  zolpidem (AMBIEN) 10 MG tablet Take 1 tablet (10 mg total) by mouth at bedtime as needed. for sleep 03/23/15   Maple Hudsonichard L Gilbert Jr., MD    Patient Active Problem List   Diagnosis Date Noted   . Absolute anemia 06/23/2014  . Anxiety 06/23/2014  . Pain in joint 06/23/2014  . 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 Clinical depression 06/23/2014  . Hypercholesteremia 06/23/2014  . Adult hypothyroidism 06/23/2014  . Cannot sleep 06/23/2014  . Billowing mitral valve 06/23/2014  . Anancastic neurosis 06/23/2014  . Avitaminosis D 06/23/2014    No past medical history on file.  Social History   Social History  . Marital Status: Divorced    Spouse Name: N/A  . Number of Children: N/A  . Years of Education: N/A   Occupational History  . Not on file.   Social History Main Topics  . Smoking status: Never Smoker   . Smokeless tobacco: Never Used  . Alcohol Use: 0.0 oz/week    0 Standard drinks or equivalent per week  . Drug Use: No  . Sexual Activity: No   Other Topics Concern  . Not on file   Social History Narrative    Allergies  Allergen Reactions  . Amoxicillin   . Morphine Other (See Comments)    Trouble walking, heavy legs  . Sulfa Antibiotics Other (See Comments)    burning  . Septra  [Sulfamethoxazole-Trimethoprim]   . Methadone Nausea And Vomiting    Review of Systems  Constitutional: Negative.   HENT: Negative.  Respiratory: Negative.   Cardiovascular: Negative.   Musculoskeletal: Positive for myalgias, back pain and joint pain.  Psychiatric/Behavioral: Positive for depression. The patient is nervous/anxious and has insomnia.     Immunization History  Administered Date(s) Administered  . Influenza,inj,Quad PF,36+ Mos 11/03/2014   Objective:  BP 118/76 mmHg  Pulse 92  Resp 16  Wt 183 lb (83.008 kg)  Physical Exam  Constitutional: She is oriented to person, place, and time and well-developed, well-nourished, and in no distress.  HENT:  Head: Normocephalic and atraumatic.  Right Ear: External ear normal.  Left Ear: External ear normal.  Eyes: Conjunctivae are normal. Pupils are  equal, round, and reactive to light.  Neck: Normal range of motion. Neck supple.  Cardiovascular: Normal rate, regular rhythm, normal heart sounds and intact distal pulses.   No murmur heard. Pulmonary/Chest: Effort normal and breath sounds normal. No respiratory distress. She has no wheezes.  Abdominal: She exhibits no distension. There is tenderness. There is no rebound and no guarding.  Chronic tenderness that she has had for many years.  Neurological: She is alert and oriented to person, place, and time. Gait normal.  Psychiatric: Her mood appears anxious. She exhibits a depressed mood. She expresses no suicidal ideation.    Lab Results  Component Value Date   WBC 6.0 03/09/2015   HGB 13.5 10/31/2013   HCT 39.8 03/09/2015   PLT 250 03/09/2015   GLUCOSE 102* 03/09/2015   CHOL 235* 03/09/2015   TRIG 179* 03/09/2015   HDL 47 03/09/2015   LDLCALC 152* 03/09/2015   TSH 1.360 03/09/2015    CMP     Component Value Date/Time   NA 141 03/09/2015 1202   NA 140 10/31/2013 1648   K 4.3 03/09/2015 1202   K 3.6 10/31/2013 1648   CL 102 03/09/2015 1202   CL 107 10/31/2013 1648   CO2 23 03/09/2015 1202   CO2 21 10/31/2013 1648   GLUCOSE 102* 03/09/2015 1202   GLUCOSE 88 10/31/2013 1648   BUN 9 03/09/2015 1202   BUN 11 10/31/2013 1648   CREATININE 0.98 03/09/2015 1202   CREATININE 1.39* 10/31/2013 1648   CREATININE 1.2* 03/27/2013   CALCIUM 9.4 03/09/2015 1202   CALCIUM 9.4 10/31/2013 1648   PROT 6.5 03/09/2015 1202   PROT 7.3 10/31/2013 1648   ALBUMIN 4.2 03/09/2015 1202   ALBUMIN 3.8 10/31/2013 1648   AST 19 03/09/2015 1202   AST 20 10/31/2013 1648   ALT 11 03/09/2015 1202   ALT 15 10/31/2013 1648   ALKPHOS 75 03/09/2015 1202   ALKPHOS 78 10/31/2013 1648   BILITOT 0.4 03/09/2015 1202   BILITOT 0.7 10/31/2013 1648   GFRNONAA 64 03/09/2015 1202   GFRNONAA 42* 10/31/2013 1648   GFRAA 74 03/09/2015 1202   GFRAA 50* 10/31/2013 1648    Assessment and Plan :  1. Pain in  joint Stable. Refills given. - Oxycodone HCl 10 MG TABS; Take 1 tablet (10 mg total) by mouth every 4 (four) hours as needed.  Dispense: 200 tablet; Refill: 0  2. Anxiety Worsening. This is a big issue for patient. Will write letter for IRS in regards to patient having severe anxiety and depression and has not filled her taxes herself for years since 2011. Also will send in letter for disability for [employer] that we have done for her before at least twice.I think the patient also has some OCD. I think these issues were absolutely debilitating for her I think she needs to  get plugged in the psychiatry suctioning as well as counseling also.  3. Clinical depression Discussed with patient about going back to psychiatrist, I think this would be good for the patient. Will let patient decide. More than 50% of visit spent in counseling.  4. Other specified hypothyroidism  5. Nonintractable migraine, unspecified migraine type Refills given.  - methylphenidate (RITALIN) 10 MG tablet; Take 1 tablet (10 mg total) by mouth 2 (two) times daily.  Dispense: 60 tablet; Refill: 0 . Patient was seen and examined by Dr. Bosie Clos and note was scribed by Samara Deist, RMA. [email or web address]   [name] MD [facility] 05/14/2015 3:15 PM

## 2015-05-15 ENCOUNTER — Telehealth: Payer: Self-pay | Admitting: Family Medicine

## 2015-05-15 NOTE — Telephone Encounter (Signed)
Pt is requesting a updated disability letter faxed to [phone] with policy # to [employer].  AO#[phone]/NGCB#[phone]/MW

## 2015-05-18 NOTE — Telephone Encounter (Signed)
Letter faxed.-aa

## 2015-05-25 ENCOUNTER — Telehealth: Payer: Self-pay

## 2015-05-25 NOTE — Telephone Encounter (Signed)
Called patient but unable to leave a message, voicemail not set up. I have letters ready for the appeal she is requesting and the information she brought, patient needs to sign both papers and also looks like she will need to write a letter of appeal also from herself and mail it all in.-aa

## 2015-05-26 NOTE — Telephone Encounter (Signed)
Patient Advised ED 

## 2015-06-01 ENCOUNTER — Telehealth: Payer: Self-pay | Admitting: Family Medicine

## 2015-06-01 NOTE — Telephone Encounter (Signed)
Pt is requesting a call back from LillyAna.  JX#[phone]/ZHCB#[phone]/MW

## 2015-06-01 NOTE — Telephone Encounter (Signed)
Spoke with patient in regards to the letter we wrote and she is afraid that maybe IRS will think patient needs assistance at home or something worse comes out of it. I advised patient i can not advise on what is best to do and she would make that decision.

## 2015-06-22 ENCOUNTER — Encounter: Payer: Self-pay | Admitting: Family Medicine

## 2015-07-15 ENCOUNTER — Ambulatory Visit (INDEPENDENT_AMBULATORY_CARE_PROVIDER_SITE_OTHER): Payer: PPO | Admitting: Family Medicine

## 2015-07-15 ENCOUNTER — Encounter: Payer: Self-pay | Admitting: Family Medicine

## 2015-07-15 VITALS — BP 102/62 | HR 72 | Temp 97.9°F | Resp 16 | Wt 182.0 lb

## 2015-07-15 DIAGNOSIS — E038 Other specified hypothyroidism: Secondary | ICD-10-CM | POA: Diagnosis not present

## 2015-07-15 DIAGNOSIS — F32A Depression, unspecified: Secondary | ICD-10-CM

## 2015-07-15 DIAGNOSIS — M2550 Pain in unspecified joint: Secondary | ICD-10-CM

## 2015-07-15 DIAGNOSIS — F419 Anxiety disorder, unspecified: Secondary | ICD-10-CM

## 2015-07-15 DIAGNOSIS — F329 Major depressive disorder, single episode, unspecified: Secondary | ICD-10-CM

## 2015-07-15 MED ORDER — OXYCODONE HCL 10 MG PO TABS: 10.0000 mg | ORAL_TABLET | ORAL | Status: DC | PRN

## 2015-07-15 MED ORDER — METHYLPHENIDATE HCL 10 MG PO TABS: 10.0000 mg | ORAL_TABLET | Freq: Two times a day (BID) | ORAL | Status: DC

## 2015-07-15 NOTE — Progress Notes (Signed)
Patient ID: Hannah Vance, female   DOB: 1956/01/03, 60 y.o.   MRN: [id number]    Subjective:  HPI  Depression- Pt reports that she has had a lot more on her than she normally has been. She had some damage to her home an is dealing with that. She is having to take care of her grandson while her son takes care of his grandmother. The chronic family dysfunction is a major issue for this patient. Chronic pain- She reports that she is hurting more because she is having to go more and do more things, then she can not sleep well. She will need refills on her pain medication today. She is also requesting refills on her Ritalin because she will run out before next visit. She has one still at the pharmacy, it will need to be post dated for August.   Prior to Admission medications   Medication Sig Start Date End Date Taking? Authorizing Provider  aspirin 81 MG EC tablet Take by mouth.   Yes Historical Provider, MD  clonazePAM (KLONOPIN) 0.5 MG tablet 1-2 three times daily 12/24/14  Yes Richard Hulen ShoutsL Gilbert Jr., MD  fluticasone [facility](FLONASE) 50 MCG/ACT nasal spray Place 2 sprays into both nostrils daily. 12/30/14  Yes Richard Hulen ShoutsL Gilbert Jr., MD  levothyroxine (SYNTHROID, LEVOTHROID) 25 MCG tablet TAKE 1 TABLET BY MOUTH DAILY 01/14/15  Yes Maple Hudsonichard L Gilbert Jr., MD  methylphenidate (RITALIN) 10 MG tablet Take 1 tablet (10 mg total) by mouth 2 (two) times daily. 05/14/15  Yes Richard Hulen ShoutsL Gilbert Jr., MD  methylphenidate (RITALIN) 10 MG tablet Take 1 tablet (10 mg total) by mouth 2 (two) times daily. 05/14/15  Yes Richard Hulen ShoutsL Gilbert Jr., MD  methylphenidate (RITALIN) 10 MG tablet Take 1 tablet (10 mg total) by mouth 2 (two) times daily. 05/14/15  Yes Richard Hulen ShoutsL Gilbert Jr., MD  Oxycodone HCl 10 MG TABS Take 1 tablet (10 mg total) by mouth every 4 (four) hours as needed. 05/14/15  Yes Richard Hulen ShoutsL Gilbert Jr., MD  SUMAtriptan (IMITREX) 50 MG tablet Take 1 tablet (50 mg total) by mouth once. May repeat in 2 hours if headache persists  or recurs. 05/14/15  Yes Richard Hulen ShoutsL Gilbert Jr., MD  Vortioxetine HBr (TRINTELLIX) 20 MG TABS Take 20 mg by mouth daily. 03/05/15  Yes Richard Hulen ShoutsL Gilbert Jr., MD  zolpidem (AMBIEN) 10 MG tablet Take 1 tablet (10 mg total) by mouth at bedtime as needed. for sleep 03/23/15  Yes Richard Hulen ShoutsL Gilbert Jr., MD    Patient Active Problem List   Diagnosis Date Noted  . Absolute anemia 06/23/2014  . Anxiety 06/23/2014  . Pain in joint 06/23/2014  . 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 Clinical depression 06/23/2014  . Hypercholesteremia 06/23/2014  . Adult hypothyroidism 06/23/2014  . Cannot sleep 06/23/2014  . Billowing mitral valve 06/23/2014  . Anancastic neurosis 06/23/2014  . Avitaminosis D 06/23/2014    History reviewed. No pertinent past medical history.  Social History   Social History  . Marital Status: Divorced    Spouse Name: N/A  . Number of Children: N/A  . Years of Education: N/A   Occupational History  . Not on file.   Social History Main Topics  . Smoking status: Never Smoker   . Smokeless tobacco: Never Used  . Alcohol Use: 0.0 oz/week    0 Standard drinks or equivalent per week  . Drug Use: No  . Sexual Activity: No  Other Topics Concern  . Not on file   Social History Narrative    Allergies  Allergen Reactions  . Amoxicillin   . Morphine Other (See Comments)    Trouble walking, heavy legs  . Sulfa Antibiotics Other (See Comments)    burning  . Septra  [Sulfamethoxazole-Trimethoprim]   . Methadone Nausea And Vomiting    Review of Systems  Constitutional: Negative.   HENT: Negative.   Eyes: Negative.   Respiratory: Negative.   Cardiovascular: Negative.   Gastrointestinal: Negative.   Genitourinary: Negative.   Musculoskeletal: Positive for back pain.  Skin: Negative.   Neurological: Negative.   Endo/Heme/Allergies: Negative.   Psychiatric/Behavioral: Positive for depression. The patient is  nervous/anxious and has insomnia.     Immunization History  Administered Date(s) Administered  . Influenza,inj,Quad PF,36+ Mos 11/03/2014   Objective:  BP 102/62 mmHg  Pulse 72  Temp(Src) 97.9 F (36.6 C) (Oral)  Resp 16  Wt 182 lb (82.555 kg)  Physical Exam  Constitutional: She is oriented to person, place, and time and well-developed, well-nourished, and in no distress.  HENT:  Head: Normocephalic and atraumatic.  Right Ear: External ear normal.  Left Ear: External ear normal.  Nose: Nose normal.  Eyes: Conjunctivae are normal.  Neck: Normal range of motion. Neck supple.  Cardiovascular: Normal rate, regular rhythm, normal heart sounds and intact distal pulses.   Pulmonary/Chest: Effort normal and breath sounds normal.  Abdominal: Bowel sounds are normal.  Musculoskeletal: Normal range of motion.  Neurological: She is alert and oriented to person, place, and time. She has normal reflexes. Gait normal. GCS score is 15.  Skin: Skin is warm and dry.  Psychiatric: Mood, memory, affect and judgment normal.    Lab Results  Component Value Date   WBC 6.0 03/09/2015   HGB 13.5 10/31/2013   HCT 39.8 03/09/2015   PLT 250 03/09/2015   GLUCOSE 102* 03/09/2015   CHOL 235* 03/09/2015   TRIG 179* 03/09/2015   HDL 47 03/09/2015   LDLCALC 152* 03/09/2015   TSH 1.360 03/09/2015    CMP     Component Value Date/Time   NA 141 03/09/2015 1202   NA 140 10/31/2013 1648   K 4.3 03/09/2015 1202   K 3.6 10/31/2013 1648   CL 102 03/09/2015 1202   CL 107 10/31/2013 1648   CO2 23 03/09/2015 1202   CO2 21 10/31/2013 1648   GLUCOSE 102* 03/09/2015 1202   GLUCOSE 88 10/31/2013 1648   BUN 9 03/09/2015 1202   BUN 11 10/31/2013 1648   CREATININE 0.98 03/09/2015 1202   CREATININE 1.39* 10/31/2013 1648   CREATININE 1.2* 03/27/2013   CALCIUM 9.4 03/09/2015 1202   CALCIUM 9.4 10/31/2013 1648   PROT 6.5 03/09/2015 1202   PROT 7.3 10/31/2013 1648   ALBUMIN 4.2 03/09/2015 1202   ALBUMIN  3.8 10/31/2013 1648   AST 19 03/09/2015 1202   AST 20 10/31/2013 1648   ALT 11 03/09/2015 1202   ALT 15 10/31/2013 1648   ALKPHOS 75 03/09/2015 1202   ALKPHOS 78 10/31/2013 1648   BILITOT 0.4 03/09/2015 1202   BILITOT 0.7 10/31/2013 1648   GFRNONAA 64 03/09/2015 1202   GFRNONAA 42* 10/31/2013 1648   GFRAA 74 03/09/2015 1202   GFRAA 50* 10/31/2013 1648    Assessment and Plan :  1. Clinical depression  2. Anxiety Patient has disabling anxiety and I truly think she needs psychiatry More than 50% of this visit is spent in counseling. Getting plugged into  an outpatient counselor would also be vital in imported helpful for this patient 3. Other specified hypothyroidism   4. Pain in joint  - Oxycodone HCl 10 MG TABS; Take 1 tablet (10 mg total) by mouth every 4 (four) hours as needed.  Dispense: 180 tablet; Refill: 0 5. Chronic back painhe absolutely tries t So limit her narcotic use and this eats again discussed with the patient. Her usual usage is 1-2 per day. Maximal is 3 pills per day 6. Chronic abdominal pain since hysterectomy  Patient was seen and examined by Dr. [name], and noted scribed by Dimas Chyle, CMA I have done the exam and reviewed the above chart and it is accurate to the best of my knowledge.  [name] MD [facility] 07/15/2015 3:25 PM

## 2015-07-21 ENCOUNTER — Ambulatory Visit: Payer: PPO | Admitting: Family Medicine

## 2015-09-07 ENCOUNTER — Other Ambulatory Visit: Payer: Self-pay | Admitting: Family Medicine

## 2015-09-07 ENCOUNTER — Ambulatory Visit (INDEPENDENT_AMBULATORY_CARE_PROVIDER_SITE_OTHER): Payer: PPO | Admitting: Family Medicine

## 2015-09-07 VITALS — BP 178/94 | HR 88 | Temp 98.2°F

## 2015-09-07 DIAGNOSIS — F419 Anxiety disorder, unspecified: Secondary | ICD-10-CM | POA: Diagnosis not present

## 2015-09-07 DIAGNOSIS — G8929 Other chronic pain: Secondary | ICD-10-CM | POA: Diagnosis not present

## 2015-09-07 DIAGNOSIS — N750 Cyst of Bartholin's gland: Secondary | ICD-10-CM

## 2015-09-07 DIAGNOSIS — N8110 Cystocele, unspecified: Secondary | ICD-10-CM | POA: Diagnosis not present

## 2015-09-07 DIAGNOSIS — R102 Pelvic and perineal pain: Secondary | ICD-10-CM

## 2015-09-07 DIAGNOSIS — IMO0002 Reserved for concepts with insufficient information to code with codable children: Secondary | ICD-10-CM

## 2015-09-07 LAB — POCT URINALYSIS DIPSTICK
Bilirubin, UA: NEGATIVE
Glucose, UA: NEGATIVE
KETONES UA: NEGATIVE
LEUKOCYTES UA: NEGATIVE
Nitrite, UA: NEGATIVE
PH UA: 5
PROTEIN UA: NEGATIVE
RBC UA: NEGATIVE
Spec Grav, UA: 1.01
Urobilinogen, UA: NEGATIVE

## 2015-09-07 MED ORDER — CLONAZEPAM 0.5 MG PO TABS: ORAL_TABLET | ORAL | 5 refills | Status: DC

## 2015-09-07 MED ORDER — OXYCODONE HCL 15 MG PO TABS: 15.0000 mg | ORAL_TABLET | ORAL | 0 refills | Status: DC | PRN

## 2015-09-07 NOTE — Progress Notes (Signed)
Hannah Vance  MRN: [id number] DOB: [birth year]  Subjective:  HPI   The patient is a 60 year old female who states that she has been having significant pelvic pain for over a month with severe pain for a week or two.  She states she feels that her bladder is falling out.  She states the pain is so bad that she is unable to tell if the pain is all vaginal or rectal also.  She has pain all across the lower pelvis. She states it does not hurt worse when she urinates except for the first morning one.  She states she has to walk holding her belly because of the pain and pressure.  Patient Active Problem List   Diagnosis Date Noted  . Absolute anemia 06/23/2014  . Anxiety 06/23/2014  . Pain in joint 06/23/2014  . 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 Clinical depression 06/23/2014  . Hypercholesteremia 06/23/2014  . Adult hypothyroidism 06/23/2014  . Cannot sleep 06/23/2014  . Billowing mitral valve 06/23/2014  . Anancastic neurosis 06/23/2014  . Avitaminosis D 06/23/2014    No past medical history on file.  Social History   Social History  . Marital status: Divorced    Spouse name: N/A  . Number of children: N/A  . Years of education: N/A   Occupational History  . Not on file.   Social History Main Topics  . Smoking status: Never Smoker  . Smokeless tobacco: Never Used  . Alcohol use 0.0 oz/week  . Drug use: No  . Sexual activity: No   Other Topics Concern  . Not on file   Social History Narrative  . No narrative on file    Outpatient Medications Prior to Visit  Medication Sig Dispense Refill  . aspirin 81 MG EC tablet Take by mouth.    . clonazePAM (KLONOPIN) 0.5 MG tablet 1-2 three times daily 180 tablet 5  . fluticasone (FLONASE) 50 MCG/ACT nasal spray Place 2 sprays into both nostrils daily. 16 g 12  . levothyroxine (SYNTHROID, LEVOTHROID) 25 MCG tablet TAKE 1 TABLET BY MOUTH DAILY 30 tablet 0  .  methylphenidate (RITALIN) 10 MG tablet Take 1 tablet (10 mg total) by mouth 2 (two) times daily. 60 tablet 0  . methylphenidate (RITALIN) 10 MG tablet Take 1 tablet (10 mg total) by mouth 2 (two) times daily. 60 tablet 0  . methylphenidate (RITALIN) 10 MG tablet Take 1 tablet (10 mg total) by mouth 2 (two) times daily. 60 tablet 0  . Oxycodone HCl 10 MG TABS Take 1 tablet (10 mg total) by mouth every 4 (four) hours as needed. 180 tablet 0  . SUMAtriptan (IMITREX) 50 MG tablet Take 1 tablet (50 mg total) by mouth once. May repeat in 2 hours if headache persists or recurs. 9 tablet 12  . Vortioxetine HBr (TRINTELLIX) 20 MG TABS Take 20 mg by mouth daily. 30 tablet 0  . zolpidem (AMBIEN) 10 MG tablet Take 1 tablet (10 mg total) by mouth at bedtime as needed. for sleep 30 tablet 5   No facility-administered medications prior to visit.     Allergies  Allergen Reactions  . Amoxicillin   . Morphine Other (See Comments)    Trouble walking, heavy legs  . Septra  [Sulfamethoxazole-Trimethoprim]   . Sulfa Antibiotics Other (See Comments)    burning  . Methadone Nausea And Vomiting    Review of Systems  Constitutional: Positive for malaise/fatigue. Negative for  fever and weight loss.  Respiratory: Negative for cough, hemoptysis, sputum production, shortness of breath and wheezing.   Cardiovascular: Negative for chest pain, palpitations, orthopnea, claudication, leg swelling and PND.  Gastrointestinal: Positive for abdominal pain (soreness), constipation, diarrhea and nausea. Negative for blood in stool, heartburn, melena and vomiting.  Genitourinary: Positive for dysuria (pressure), flank pain and urgency. Negative for frequency and hematuria.  Neurological: Negative for dizziness, weakness and headaches.   Objective:  BP (!) 178/94 (BP Location: Right Arm, Patient Position: Sitting, Cuff Size: Normal)   Pulse 88   Temp 98.2 F (36.8 C) (Oral)   Physical Exam  Constitutional: She is  well-developed, well-nourished, and in no distress.  HENT:  Head: Normocephalic and atraumatic.  Eyes: Pupils are equal, round, and reactive to light.  Neck: Normal range of motion. Neck supple.  Cardiovascular: Normal rate, regular rhythm and normal heart sounds.   Pulmonary/Chest: Effort normal and breath sounds normal.  Genitourinary:  Genitourinary Comments: Cystocele and non infected bartholin cyst present    Assessment and Plan :    1. Acute anxiety/chronic anxiety  - clonazePAM (KLONOPIN) 0.5 MG tablet; 1-2 three times daily  Dispense: 180 tablet; Refill: 5  2. Chronic pain  - oxyCODONE (ROXICODONE) 15 MG immediate release tablet; Take 1 tablet (15 mg total) by mouth every 4 (four) hours as needed for pain.  Dispense: 180 tablet; Refill: 0  3. Cystocele  Patient is to follow up at [facility] for evaluation.  4. Bartholin gland cyst  Non infected, no treatment needed at this time.   5. Pelvic pain in female  - POCT urinalysis dipstick   Patient was seen and examined by Dr. Gerlene Burdock L. Wendelyn Vance and the note was scribed by Janey Greaser, RMA.   I have done the exam and reviewed the above chart and it is accurate to the best of my knowledge.  Hannah Manson MD [facility] 09/07/2015 8:58 AM

## 2015-09-11 NOTE — Telephone Encounter (Signed)
error 

## 2015-09-23 ENCOUNTER — Ambulatory Visit: Payer: PPO | Admitting: Family Medicine

## 2015-09-29 ENCOUNTER — Telehealth: Payer: Self-pay | Admitting: Family Medicine

## 2015-09-29 NOTE — Telephone Encounter (Addendum)
Pt. Calling wanting to talk with Hannah RamusAna, pt states she has a couple of questions about what her surgeon told her from [place]. Please return pt's call @ [phone]. Thanks CC

## 2015-09-29 NOTE — Telephone Encounter (Signed)
Patient states she went to [place] and found that she has prolapse vaginal wall not prolapse bladder. She was advised" to put her fingers in the vagina and try to push it back in" from what she understood and she can not do it. She understood her intestines are getting blocked by things getting pushed out, she was advised to take increase fiber. She was advised if surgery was done for her prolapse her pain would be worse. She tried to explain everything they told her and is not sure what needs to happen. I advised patient we need to get the office notes to see what exactly was done and said before we can help patient understand. Advised patient will call and get this information their number is [phone]. MRN [id number] for the patient.-aa Requested records.

## 2015-10-05 NOTE — Telephone Encounter (Signed)
Stop the miralax

## 2015-10-05 NOTE — Telephone Encounter (Signed)
I looked over [place] [facility] [place] [facility] [facility] [place]. She was diagnosed with vaginal posterior prolapse stage II, also discussed constipation. She was advised to start Fiber daily with Miralax and do this regimen for 3 months. When I spoke with patient last week she said after adding fiber and Miralax she had bowel accidents at night and during the day. Can she stop taking one of these or what to do? She is also afraid of "stuff falling out of her from vaginal/rectal area"-aa

## 2015-10-06 NOTE — Telephone Encounter (Signed)
Advised Hannah Vance as per notes at Urogynecology and Hannah Vance will take as directed fiber and Miralax daily and stool softner with stimulant only if she has no bowel movement for 3 to 4 days. Also discussed vaginal prolapse and that it is mild right now and to be as active as possible per Dr Aldean AstGilbert.-aa Hannah Vance understands and will keep us updated

## 2015-10-06 NOTE — Telephone Encounter (Signed)
No answer unable to leave a message-aa, also have additional information to talk to her about.

## 2015-10-08 DIAGNOSIS — G894 Chronic pain syndrome: Secondary | ICD-10-CM | POA: Insufficient documentation

## 2015-10-20 ENCOUNTER — Other Ambulatory Visit: Payer: Self-pay | Admitting: Family Medicine

## 2015-10-20 NOTE — Telephone Encounter (Signed)
Dr Gilbert's patient, please review-aa 

## 2015-10-20 NOTE — Telephone Encounter (Signed)
RX called in-aa 

## 2015-10-20 NOTE — Telephone Encounter (Signed)
Will write one refill of Ambien for patient to pick up. Must keep appointment with Dr. Sullivan LoneGilbert next month for further refills.

## 2015-11-05 ENCOUNTER — Ambulatory Visit: Payer: PPO | Admitting: Family Medicine

## 2015-11-10 ENCOUNTER — Encounter: Payer: Self-pay | Admitting: Family Medicine

## 2015-11-10 ENCOUNTER — Ambulatory Visit (INDEPENDENT_AMBULATORY_CARE_PROVIDER_SITE_OTHER): Payer: PPO | Admitting: Family Medicine

## 2015-11-10 VITALS — BP 110/64 | HR 80 | Temp 97.8°F | Resp 18 | Wt 176.0 lb

## 2015-11-10 DIAGNOSIS — R102 Pelvic and perineal pain: Secondary | ICD-10-CM | POA: Diagnosis not present

## 2015-11-10 DIAGNOSIS — G8929 Other chronic pain: Secondary | ICD-10-CM | POA: Diagnosis not present

## 2015-11-10 LAB — POCT URINALYSIS DIPSTICK
Blood, UA: NEGATIVE
GLUCOSE UA: NEGATIVE
KETONES UA: NEGATIVE
Leukocytes, UA: NEGATIVE
NITRITE UA: NEGATIVE
SPEC GRAV UA: 1.025
UROBILINOGEN UA: 0.2
pH, UA: 6

## 2015-11-10 MED ORDER — GABAPENTIN 100 MG PO CAPS: 100.0000 mg | ORAL_CAPSULE | Freq: Every day | ORAL | 12 refills | Status: DC

## 2015-11-10 MED ORDER — OXYCODONE HCL 15 MG PO TABS: 15.0000 mg | ORAL_TABLET | ORAL | 0 refills | Status: DC | PRN

## 2015-11-10 MED ORDER — METHYLPHENIDATE HCL 10 MG PO TABS
10.0000 mg | ORAL_TABLET | Freq: Two times a day (BID) | ORAL | 0 refills | Status: DC
Start: 2015-11-10 — End: 2015-11-10

## 2015-11-10 MED ORDER — METHYLPHENIDATE HCL 10 MG PO TABS: 10.0000 mg | ORAL_TABLET | Freq: Two times a day (BID) | ORAL | 0 refills | Status: DC

## 2015-11-10 MED ORDER — METHYLPHENIDATE HCL 10 MG PO TABS
10.0000 mg | ORAL_TABLET | Freq: Two times a day (BID) | ORAL | 0 refills | Status: DC
Start: 2015-11-10 — End: 2015-11-30

## 2015-11-10 NOTE — Progress Notes (Signed)
Subjective:  HPI Pt is here for abdominal pain, vaginal pain and rectal pain. She reports that she started hurting bad last week. She has seen a Careers adviser in [place] for this and was told she has posterior vagina wall prolapse. This pain is the same pain she's had in the past, just much worse.   Prior to Admission medications   Medication Sig Start Date End Date Taking? Authorizing Provider  aspirin 81 MG EC tablet Take by mouth.    Historical Provider, MD  clonazePAM [facility]) 0.5 MG tablet 1-2 three times daily 09/07/15   Maple Hudson., MD  fluticasone [facility]) 50 MCG/ACT nasal spray Place 2 sprays into both nostrils daily. 12/30/14   Richard Hulen Shouts., MD  levothyroxine (SYNTHROID, LEVOTHROID) 25 MCG tablet TAKE 1 TABLET BY MOUTH DAILY 01/14/15   Maple Hudson., MD  methylphenidate (RITALIN) 10 MG tablet Take 1 tablet (10 mg total) by mouth 2 (two) times daily. 05/14/15   Richard Hulen Shouts., MD  methylphenidate (RITALIN) 10 MG tablet Take 1 tablet (10 mg total) by mouth 2 (two) times daily. 05/14/15   Richard Hulen Shouts., MD  methylphenidate (RITALIN) 10 MG tablet Take 1 tablet (10 mg total) by mouth 2 (two) times daily. 07/15/15   Richard Hulen Shouts., MD  oxyCODONE (ROXICODONE) 15 MG immediate release tablet Take 1 tablet (15 mg total) by mouth every 4 (four) hours as needed for pain. 09/07/15   Richard Hulen Shouts., MD  Oxycodone HCl 10 MG TABS Take 1 tablet (10 mg total) by mouth every 4 (four) hours as needed. 07/15/15   Richard Hulen Shouts., MD  SUMAtriptan (IMITREX) 50 MG tablet Take 1 tablet (50 mg total) by mouth once. May repeat in 2 hours if headache persists or recurs. 05/14/15   Richard Hulen Shouts., MD  Vortioxetine HBr (TRINTELLIX) 20 MG TABS Take 20 mg by mouth daily. 03/05/15   Richard Hulen Shouts., MD  zolpidem (AMBIEN) 10 MG tablet TAKE 1 TABLET BY MOUTH AT BEDTIME AS NEEDED 10/20/15   Tamsen Roers, PA    Patient Active Problem List   Diagnosis  Date Noted  . Chronic pain syndrome 10/08/2015  . Absolute anemia 06/23/2014  . Anxiety 06/23/2014  . Pain in joint 06/23/2014  . 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 Clinical depression 06/23/2014  . Hypercholesteremia 06/23/2014  . Adult hypothyroidism 06/23/2014  . Cannot sleep 06/23/2014  . Billowing mitral valve 06/23/2014  . Anancastic neurosis 06/23/2014  . Avitaminosis D 06/23/2014    History reviewed. No pertinent past medical history.  Social History   Social History  . Marital status: Divorced    Spouse name: N/A  . Number of children: N/A  . Years of education: N/A   Occupational History  . Not on file.   Social History Main Topics  . Smoking status: Never Smoker  . Smokeless tobacco: Never Used  . Alcohol use 0.0 oz/week  . Drug use: No  . Sexual activity: No   Other Topics Concern  . Not on file   Social History Narrative  . No narrative on file    Allergies  Allergen Reactions  . Amoxicillin   . Morphine Other (See Comments)    Trouble walking, heavy legs  . Septra  [Sulfamethoxazole-Trimethoprim]   . Sulfa Antibiotics Other (See Comments)    burning  . Methadone Nausea And Vomiting  Review of Systems  Constitutional: Positive for malaise/fatigue.  HENT: Negative.   Eyes: Negative.   Respiratory: Negative.   Cardiovascular: Negative.   Gastrointestinal: Positive for abdominal pain.  Genitourinary: Positive for frequency.  Musculoskeletal: Negative.   Skin: Negative.   Neurological: Positive for weakness.  Endo/Heme/Allergies: Negative.   Psychiatric/Behavioral: The patient is nervous/anxious.     Immunization History  Administered Date(s) Administered  . Influenza,inj,Quad PF,36+ Mos 11/03/2014   Objective:  BP 110/64 (BP Location: Left Arm, Patient Position: Sitting, Cuff Size: Large)   Pulse 80   Temp 97.8 F (36.6 C) (Oral)   Resp 18   Wt 176 lb (79.8 kg)   BMI  28.41 kg/m   Physical Exam  Constitutional: She is oriented to person, place, and time and well-developed, well-nourished, and in no distress.  HENT:  Head: Normocephalic and atraumatic.  Right Ear: External ear normal.  Left Ear: External ear normal.  Nose: Nose normal.  Eyes: Conjunctivae and EOM are normal. Pupils are equal, round, and reactive to light.  Neck: Normal range of motion. Neck supple.  Cardiovascular: Normal rate, regular rhythm, normal heart sounds and intact distal pulses.   Pulmonary/Chest: Effort normal and breath sounds normal.  Abdominal: Soft. Bowel sounds are normal. There is tenderness (mildly tender across pelvis. ).  Neurological: She is alert and oriented to person, place, and time. She has normal reflexes. Gait normal. GCS score is 15.  Skin: Skin is warm and dry.  Psychiatric: Mood, memory, affect and judgment normal.    Lab Results  Component Value Date   WBC 6.0 03/09/2015   HGB 13.5 10/31/2013   HCT 39.8 03/09/2015   PLT 250 03/09/2015   GLUCOSE 102 (H) 03/09/2015   CHOL 235 (H) 03/09/2015   TRIG 179 (H) 03/09/2015   HDL 47 03/09/2015   LDLCALC 152 (H) 03/09/2015   TSH 1.360 03/09/2015    CMP     Component Value Date/Time   NA 141 03/09/2015 1202   NA 140 10/31/2013 1648   K 4.3 03/09/2015 1202   K 3.6 10/31/2013 1648   CL 102 03/09/2015 1202   CL 107 10/31/2013 1648   CO2 23 03/09/2015 1202   CO2 21 10/31/2013 1648   GLUCOSE 102 (H) 03/09/2015 1202   GLUCOSE 88 10/31/2013 1648   BUN 9 03/09/2015 1202   BUN 11 10/31/2013 1648   CREATININE 0.98 03/09/2015 1202   CREATININE 1.39 (H) 10/31/2013 1648   CALCIUM 9.4 03/09/2015 1202   CALCIUM 9.4 10/31/2013 1648   PROT 6.5 03/09/2015 1202   PROT 7.3 10/31/2013 1648   ALBUMIN 4.2 03/09/2015 1202   ALBUMIN 3.8 10/31/2013 1648   AST 19 03/09/2015 1202   AST 20 10/31/2013 1648   ALT 11 03/09/2015 1202   ALT 15 10/31/2013 1648   ALKPHOS 75 03/09/2015 1202   ALKPHOS 78 10/31/2013 1648     BILITOT 0.4 03/09/2015 1202   BILITOT 0.7 10/31/2013 1648   GFRNONAA 64 03/09/2015 1202   GFRNONAA 42 (L) 10/31/2013 1648   GFRAA 74 03/09/2015 1202   GFRAA 50 (L) 10/31/2013 1648    Assessment and Plan :  1. Pelvic pain Patient is status post hysterectomy and has rectal prolapse.  - POCT urinalysis dipstick  2. Other chronic pain Refills x3  - oxyCODONE (ROXICODONE) 15 MG immediate release tablet; Take 1 tablet (15 mg total) by mouth every 4 (four) hours as needed for pain.  Dispense: 180 tablet; Refill: 0 3. Chronic depression and major  anxiety More than 50% of this visit is spent in counseling. Return to to clinic 2 months  HPI, Exam, and A&P Transcribed under the direction and in the presence of Richard L. Wendelyn BreslowGilbert Jr, MD  Electronically Signed: Dimas ChyleBrittany Byrd, CMA  Julieanne Mansonichard Gilbert MD [facility] 11/10/2015 11:07 AM

## 2015-11-12 ENCOUNTER — Ambulatory Visit: Payer: PPO | Admitting: Family Medicine

## 2015-11-30 ENCOUNTER — Encounter: Payer: Self-pay | Admitting: Family Medicine

## 2015-11-30 ENCOUNTER — Ambulatory Visit (INDEPENDENT_AMBULATORY_CARE_PROVIDER_SITE_OTHER): Payer: PPO | Admitting: Family Medicine

## 2015-11-30 VITALS — BP 108/70 | HR 92 | Temp 98.4°F | Resp 16 | Wt 179.0 lb

## 2015-11-30 DIAGNOSIS — L821 Other seborrheic keratosis: Secondary | ICD-10-CM | POA: Diagnosis not present

## 2015-11-30 DIAGNOSIS — Z23 Encounter for immunization: Secondary | ICD-10-CM | POA: Diagnosis not present

## 2015-11-30 NOTE — Progress Notes (Signed)
Patient: Hannah Vance Female    DOB: 1955-12-15   60 y.o.   MRN: [id number] Visit Date: 11/30/2015  Today's Provider: [name], MD   Chief Complaint  Patient presents with  . Skin Problem   Subjective:    HPI   Skin Problem Pt reports she burned a mole off of her back while using a heating pad about 2 months ago. Has used Neosporin, and states she noticed that some skin has come off of the affected area. Is currently itching. Is unsure if it is bleeding, etc. Comes in for skin check.  No Fever, no bleeding, no peeling of the skin. Allergies  Allergen Reactions  . Amoxicillin   . Morphine Other (See Comments)    Trouble walking, heavy legs  . Septra  [Sulfamethoxazole-Trimethoprim]   . Sulfa Antibiotics Other (See Comments)    burning  . Methadone Nausea And Vomiting     Current Outpatient Prescriptions:  .  aspirin 81 MG EC tablet, Take by mouth., Disp: , Rfl:  .  clonazePAM (KLONOPIN) 0.5 MG tablet, 1-2 three times daily, Disp: 180 tablet, Rfl: 5 .  cyclobenzaprine (FLEXERIL) 5 MG tablet, Take 5 mg by mouth., Disp: , Rfl:  .  fluticasone (FLONASE) 50 MCG/ACT nasal spray, Place 2 sprays into both nostrils daily., Disp: 16 g, Rfl: 12 .  gabapentin (NEURONTIN) 100 MG capsule, Take 1 capsule (100 mg total) by mouth at bedtime., Disp: 30 capsule, Rfl: 12 .  levothyroxine (SYNTHROID, LEVOTHROID) 25 MCG tablet, TAKE 1 TABLET BY MOUTH DAILY, Disp: 30 tablet, Rfl: 0 .  lidocaine (XYLOCAINE) 5 % ointment, Insert 1/2 inch via vaginal applicator to top of vagina qhs., Disp: , Rfl:  .  methylphenidate (RITALIN) 10 MG tablet, Take 1 tablet (10 mg total) by mouth 2 (two) times daily., Disp: 60 tablet, Rfl: 0 .  oxyCODONE (ROXICODONE) 15 MG immediate release tablet, Take 1 tablet (15 mg total) by mouth every 4 (four) hours as needed for pain., Disp: 180 tablet, Rfl: 0 .  SUMAtriptan (IMITREX) 50 MG tablet, Take 1 tablet (50 mg total) by mouth once. May repeat in 2 hours  if headache persists or recurs., Disp: 9 tablet, Rfl: 12 .  Vortioxetine HBr (TRINTELLIX) 20 MG TABS, Take 20 mg by mouth daily., Disp: 30 tablet, Rfl: 0 .  zolpidem (AMBIEN) 10 MG tablet, TAKE 1 TABLET BY MOUTH AT BEDTIME AS NEEDED, Disp: 30 tablet, Rfl: 1  Review of Systems  Constitutional: Negative for activity change, appetite change, chills, diaphoresis, fever and unexpected weight change.  Respiratory: Negative.   Cardiovascular: Negative.   Endocrine: Negative.   Skin:       Skin changes present  Psychiatric/Behavioral: Positive for agitation.    Social History  Substance Use Topics  . Smoking status: Never Smoker  . Smokeless tobacco: Never Used  . Alcohol use 0.0 oz/week   Objective:   BP 108/70 (BP Location: Left Arm, Patient Position: Sitting, Cuff Size: Large)   Pulse 92   Temp 98.4 F (36.9 C) (Oral)   Resp 16   Wt 179 lb (81.2 kg)   BMI 28.89 kg/m   Physical Exam  Constitutional: She appears well-developed and well-nourished.  HENT:  Head: Normocephalic and atraumatic.  Cardiovascular: Normal rate and regular rhythm.   Pulmonary/Chest: Effort normal.  Skin: Skin is warm and dry.  Small seborrhoic keratosis located on back. Is not infected.  Psychiatric: She has a normal mood and affect. Her  behavior is normal. Judgment and thought content normal.        Assessment & Plan:     1. Flu vaccine need Administered today. - Flu Vaccine QUAD 36+ mos IM  2. Seborrheic keratosis/Fleshy skin tag of back Was burned from heating pad. Is healing. Advised pt this is non-cancerous and not concerning. Keep area cleaned. 3.. Mild first-degree burn of lower back Patient seen and examined by Julieanne Mansonichard Camay Pedigo, MD, and note scribed by Allene DillonEmily Drozdowski, CMA.   Gwynn Chalker Wendelyn BreslowGilbert Jr, MD  [facility]

## 2016-01-07 ENCOUNTER — Other Ambulatory Visit: Payer: Self-pay | Admitting: Family Medicine

## 2016-01-07 ENCOUNTER — Telehealth: Payer: Self-pay | Admitting: Family Medicine

## 2016-01-07 DIAGNOSIS — G8929 Other chronic pain: Secondary | ICD-10-CM

## 2016-01-07 MED ORDER — ZOLPIDEM TARTRATE 10 MG PO TABS: 10.0000 mg | ORAL_TABLET | Freq: Every evening | ORAL | 0 refills | Status: DC | PRN

## 2016-01-07 MED ORDER — METHYLPHENIDATE HCL 10 MG PO TABS: 10.0000 mg | ORAL_TABLET | Freq: Two times a day (BID) | ORAL | 0 refills | Status: DC

## 2016-01-07 MED ORDER — OXYCODONE HCL 15 MG PO TABS: 15.0000 mg | ORAL_TABLET | ORAL | 0 refills | Status: DC | PRN

## 2016-01-07 NOTE — Telephone Encounter (Signed)
Ok for 1 rf each.

## 2016-01-07 NOTE — Telephone Encounter (Signed)
Pt needs refill on   oxyCODONE (ROXICODONE) 15 MG immediate release tablet  methylphenidate (RITALIN) 10 MG tablet   zolpidem (AMBIEN) 10 MG tablet  Pt has an appt on Monday.  Thank sTeri

## 2016-01-07 NOTE — Telephone Encounter (Signed)
Please review. KW 

## 2016-01-07 NOTE — Telephone Encounter (Signed)
Pt informed that rx's are ready for pick up.

## 2016-01-07 NOTE — Telephone Encounter (Signed)
error 

## 2016-01-07 NOTE — Telephone Encounter (Signed)
Pt called to see if Rx for oxyCODONE (ROXICODONE) 15 MG immediate release tablet. Please advise. Thanks TNP

## 2016-01-09 ENCOUNTER — Other Ambulatory Visit: Payer: Self-pay | Admitting: Family Medicine

## 2016-01-09 DIAGNOSIS — J309 Allergic rhinitis, unspecified: Secondary | ICD-10-CM

## 2016-01-11 ENCOUNTER — Ambulatory Visit (INDEPENDENT_AMBULATORY_CARE_PROVIDER_SITE_OTHER): Payer: PPO | Admitting: Family Medicine

## 2016-01-11 VITALS — BP 128/82 | HR 80 | Temp 98.6°F | Resp 14 | Wt 182.0 lb

## 2016-01-11 DIAGNOSIS — F3289 Other specified depressive episodes: Secondary | ICD-10-CM

## 2016-01-11 DIAGNOSIS — F419 Anxiety disorder, unspecified: Secondary | ICD-10-CM

## 2016-01-11 DIAGNOSIS — R102 Pelvic and perineal pain: Secondary | ICD-10-CM

## 2016-01-11 DIAGNOSIS — G8929 Other chronic pain: Secondary | ICD-10-CM | POA: Diagnosis not present

## 2016-01-11 NOTE — Progress Notes (Signed)
Hannah Vance H Chamorro  MRN: [id number] DOB: 1955-03-30  Subjective:  HPI  Patient is here for follow up. Last lab work done in January 2017. BP Readings from Last 3 Encounters:  01/11/16 128/82  11/30/15 108/70  11/10/15 110/64   Chronic issues are about the same.  Patient Active Problem List   Diagnosis Date Noted  . Chronic pain syndrome 10/08/2015  . Absolute anemia 06/23/2014  . Anxiety 06/23/2014  . Pain in joint 06/23/2014  . 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 Clinical depression 06/23/2014  . Hypercholesteremia 06/23/2014  . Adult hypothyroidism 06/23/2014  . Cannot sleep 06/23/2014  . Billowing mitral valve 06/23/2014  . Anancastic neurosis 06/23/2014  . Avitaminosis D 06/23/2014    No past medical history on file.  Social History   Social History  . Marital status: Divorced    Spouse name: N/A  . Number of children: N/A  . Years of education: N/A   Occupational History  . Not on file.   Social History Main Topics  . Smoking status: Never Smoker  . Smokeless tobacco: Never Used  . Alcohol use No  . Drug use: No  . Sexual activity: No   Other Topics Concern  . Not on file   Social History Narrative  . No narrative on file    Outpatient Encounter Prescriptions as of 01/11/2016  Medication Sig Note  . aspirin 81 MG EC tablet Take by mouth. 06/23/2014: Received from: [facility]  . clonazePAM (KLONOPIN) 0.5 MG tablet 1-2 three times daily   . cyclobenzaprine (FLEXERIL) 5 MG tablet Take 5 mg by mouth. 11/10/2015: [facility] Received Sig: Take 1 tablet (5 mg total) by mouth Three (3) times a day.  . fluticasone (FLONASE) 50 MCG/ACT nasal spray PLACE 2 SPRAYS INTO BOTH NOSTRILS DAILY.   Marland Kitchen. gabapentin (NEURONTIN) 100 MG capsule Take 1 capsule (100 mg total) by mouth at bedtime.   Marland Kitchen. levothyroxine (SYNTHROID, LEVOTHROID) 25 MCG tablet TAKE 1 TABLET BY MOUTH DAILY     . lidocaine (XYLOCAINE) 5 % ointment Insert 1/2 inch via vaginal applicator to top of vagina qhs. 11/10/2015: [facility]: [facility]  . methylphenidate (RITALIN) 10 MG tablet Take 1 tablet (10 mg total) by mouth 2 (two) times daily.   Marland Kitchen. oxyCODONE (ROXICODONE) 15 MG immediate release tablet Take 1 tablet (15 mg total) by mouth every 4 (four) hours as needed for pain.   . SUMAtriptan (IMITREX) 50 MG tablet Take 1 tablet (50 mg total) by mouth once. May repeat in 2 hours if headache persists or recurs.   . Vortioxetine HBr (TRINTELLIX) 20 MG TABS Take 20 mg by mouth daily.   Marland Kitchen. zolpidem (AMBIEN) 10 MG tablet Take 1 tablet (10 mg total) by mouth at bedtime as needed.    No facility-administered encounter medications on file as of 01/11/2016.     Allergies  Allergen Reactions  . Amoxicillin   . Morphine Other (See Comments)    Trouble walking, heavy legs  . Septra  [Sulfamethoxazole-Trimethoprim]   . Sulfa Antibiotics Other (See Comments)    burning  . Methadone Nausea And Vomiting    Review of Systems  Constitutional: Negative.   Respiratory: Negative.   Cardiovascular: Negative.   Musculoskeletal: Positive for back pain, joint pain and myalgias.  Psychiatric/Behavioral: Positive for depression. The patient is nervous/anxious and has insomnia.     Objective:  BP 128/82   Pulse 80  Temp 98.6 F (37 C)   Resp 14   Wt 182 lb (82.6 kg)   BMI 29.38 kg/m   Physical Exam  Constitutional: She is oriented to person, place, and time and well-developed, well-nourished, and in no distress.  HENT:  Head: Normocephalic and atraumatic.  Eyes: Conjunctivae are normal. No scleral icterus.  Neck: No thyromegaly present.  Cardiovascular: Normal rate, regular rhythm and normal heart sounds.   Pulmonary/Chest: Effort normal and breath sounds normal.  Abdominal: Soft.  Neurological: She is alert and oriented to person, place, and time.  Skin: Skin is warm and dry.  Psychiatric: Mood,  memory, affect and judgment normal.    Assessment and Plan :  1. Pelvic pain Chronic. Patient would like second opinion. [facility]. - Ambulatory referral to Gynecology  2. Other chronic pain  3. Acute anxiety//chronic anxiety Major issue for this patient 4. Other depression 5 adjust reaction related to #4 and #3 Patient has major relational issues with family. Especially she has a son who is very unkind to his mother. Patient would benefit greatly from counseling regarding these issues. 6. /status post hysterectomy  HPI, Exam and A&P transcribed under direction and in the presence of Julieanne Mansonichard Gilbert, MD. I have done the exam and reviewed the chart and it is accurate to the best of my knowledge. DentistDragon  technology has been used and  any errors in dictation or transcription are unintentional. Julieanne Mansonichard Gilbert M.D. [facility]

## 2016-01-21 ENCOUNTER — Other Ambulatory Visit: Payer: Self-pay | Admitting: Family Medicine

## 2016-01-21 DIAGNOSIS — F329 Major depressive disorder, single episode, unspecified: Secondary | ICD-10-CM

## 2016-01-21 DIAGNOSIS — F32A Depression, unspecified: Secondary | ICD-10-CM

## 2016-02-03 ENCOUNTER — Other Ambulatory Visit: Payer: Self-pay | Admitting: Family Medicine

## 2016-02-04 NOTE — Telephone Encounter (Signed)
Please call in zolpidem  

## 2016-02-04 NOTE — Telephone Encounter (Signed)
Last ov 01/11/16 last filled 01/07/16. Please review. Thank you. sd

## 2016-02-04 NOTE — Telephone Encounter (Signed)
Called into pharmacy

## 2016-02-11 ENCOUNTER — Ambulatory Visit (INDEPENDENT_AMBULATORY_CARE_PROVIDER_SITE_OTHER): Payer: Medicare HMO | Admitting: Family Medicine

## 2016-02-11 VITALS — BP 124/82 | HR 118 | Temp 97.7°F | Resp 18 | Wt 181.0 lb

## 2016-02-11 DIAGNOSIS — R69 Illness, unspecified: Secondary | ICD-10-CM | POA: Diagnosis not present

## 2016-02-11 DIAGNOSIS — L509 Urticaria, unspecified: Secondary | ICD-10-CM | POA: Diagnosis not present

## 2016-02-11 DIAGNOSIS — F3289 Other specified depressive episodes: Secondary | ICD-10-CM

## 2016-02-11 DIAGNOSIS — F419 Anxiety disorder, unspecified: Secondary | ICD-10-CM | POA: Diagnosis not present

## 2016-02-11 MED ORDER — PREDNISONE 10 MG (21) PO TBPK: ORAL_TABLET | ORAL | 0 refills | Status: DC

## 2016-02-11 MED ORDER — LORATADINE 10 MG PO TABS: 10.0000 mg | ORAL_TABLET | Freq: Every day | ORAL | 11 refills | Status: DC

## 2016-02-11 NOTE — Progress Notes (Signed)
Hannah Vance  MRN: [id number] DOB: 1955-04-17  Subjective:  HPI  Patient states she started to have itching all over her body on 02/01/16. She does not recall doing or using anything different except for going to her moms and the next day developing itching. Areas she scratches get red and hot. She thought at first she had fleas but does not have any bites. She has been taking Benadryl 1 to 2 times daily and it does help but makes her drowsy.  Patient Active Problem List   Diagnosis Date Noted  . Chronic pain syndrome 10/08/2015  . Absolute anemia 06/23/2014  . Anxiety 06/23/2014  . Pain in joint 06/23/2014  . 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 Clinical depression 06/23/2014  . Hypercholesteremia 06/23/2014  . Adult hypothyroidism 06/23/2014  . Cannot sleep 06/23/2014  . Billowing mitral valve 06/23/2014  . Anancastic neurosis 06/23/2014  . Avitaminosis D 06/23/2014    No past medical history on file.  Social History   Social History  . Marital status: Divorced    Spouse name: N/A  . Number of children: N/A  . Years of education: N/A   Occupational History  . Not on file.   Social History Main Topics  . Smoking status: Never Smoker  . Smokeless tobacco: Never Used  . Alcohol use No  . Drug use: No  . Sexual activity: No   Other Topics Concern  . Not on file   Social History Narrative  . No narrative on file    Outpatient Encounter Prescriptions as of 02/11/2016  Medication Sig Note  . aspirin 81 MG EC tablet Take by mouth. 06/23/2014: Received from: [facility]  . clonazePAM (KLONOPIN) 0.5 MG tablet 1-2 three times daily   . cyclobenzaprine (FLEXERIL) 5 MG tablet Take 5 mg by mouth. 11/10/2015: [facility] Received Sig: Take 1 tablet (5 mg total) by mouth Three (3) times a day.  . fluticasone (FLONASE) 50 MCG/ACT nasal spray PLACE 2 SPRAYS INTO BOTH NOSTRILS DAILY.    Marland Kitchen. gabapentin (NEURONTIN) 100 MG capsule Take 1 capsule (100 mg total) by mouth at bedtime.   Marland Kitchen. levothyroxine (SYNTHROID, LEVOTHROID) 25 MCG tablet TAKE 1 TABLET (25 MCG TOTAL) BY MOUTH DAILY.   Marland Kitchen. lidocaine (XYLOCAINE) 5 % ointment Insert 1/2 inch via vaginal applicator to top of vagina qhs. 11/10/2015: [facility]  . methylphenidate (RITALIN) 10 MG tablet Take 1 tablet (10 mg total) by mouth 2 (two) times daily.   Marland Kitchen. oxyCODONE (ROXICODONE) 15 MG immediate release tablet Take 1 tablet (15 mg total) by mouth every 4 (four) hours as needed for pain.   . SUMAtriptan (IMITREX) 50 MG tablet Take 1 tablet (50 mg total) by mouth once. May repeat in 2 hours if headache persists or recurs.   . Vortioxetine HBr (TRINTELLIX) 20 MG TABS Take 20 mg by mouth daily.   Marland Kitchen. zolpidem (AMBIEN) 10 MG tablet TAKE 1 TABLET BY MOUTH AT BEDTIME AS NEEDED   . [DISCONTINUED] levothyroxine (SYNTHROID, LEVOTHROID) 25 MCG tablet TAKE 1 TABLET BY MOUTH DAILY    No facility-administered encounter medications on file as of 02/11/2016.     Allergies  Allergen Reactions  . Amoxicillin   . Morphine Other (See Comments)    Trouble walking, heavy legs  . Septra  [Sulfamethoxazole-Trimethoprim]   . Sulfa Antibiotics Other (See Comments)    burning  . Methadone Nausea And Vomiting  Review of Systems  Respiratory: Negative.   Cardiovascular: Negative.   Gastrointestinal: Positive for abdominal pain.       Bowel incontinence, rectal pain  Genitourinary:       Bad odor to her urine, vaginal pain  Musculoskeletal: Positive for back pain, joint pain and myalgias.  Skin: Positive for itching and rash.  Psychiatric/Behavioral: Positive for depression. The patient is nervous/anxious and has insomnia.     Objective:  BP 124/82   Pulse (!) 118   Temp 97.7 F (36.5 C)   Resp 18   Wt 181 lb (82.1 kg)   BMI 29.21 kg/m   Physical Exam  Constitutional: She is oriented to person, place, and time and  well-developed, well-nourished, and in no distress.  HENT:  Head: Normocephalic and atraumatic.  Cardiovascular: Normal rate and regular rhythm.   Pulmonary/Chest: Effort normal and breath sounds normal.  Neurological: She is alert and oriented to person, place, and time.  Skin: Skin is warm and dry. There is erythema.  Patches of urticaria/wheal on different places on body were patient has scratched.  Psychiatric: Mood, memory, affect and judgment normal.    Assessment and Plan :  1. Urticaria Will try Loratadine, check CBC, MetC. RX for Prednisone given in case symptoms do not improve by the weekend. Follow as needed.Use doxepin of this does not help. - CBC w/Diff/Platelet - Comprehensive metabolic panel - loratadine (CLARITIN) 10 MG tablet; Take 1 tablet (10 mg total) by mouth daily.  Dispense: 30 tablet; Refill: 11 - predniSONE (STERAPRED UNI-PAK 21 TAB) 10 MG (21) TBPK tablet; As directed  Dispense: 21 tablet; Refill: 0 - TSH  2. Acute/chronic  anxiety Due for check. - TSH  3. Other depression - CBC w/Diff/Platelet - Comprehensive metabolic panel 4. Chronic pain Mainly subsequent abdominal and pelvic pains that she has had since her hysterectomy. She has an appointment with a specialist now at [facility].  I have done the exam and reviewed the chart and it is accurate to the best of my knowledge. Dentist has been used and  any errors in dictation or transcription are unintentional. [name] M.D. [facility]  HPI, Exam and A&P transcribed under direction and in the presence of [name], MD.

## 2016-02-12 ENCOUNTER — Telehealth: Payer: Self-pay

## 2016-02-12 LAB — COMPREHENSIVE METABOLIC PANEL
A/G RATIO: 1.9 (ref 1.2–2.2)
ALT: 11 IU/L (ref 0–32)
AST: 17 IU/L (ref 0–40)
Albumin: 4.4 g/dL (ref 3.5–5.5)
Alkaline Phosphatase: 82 IU/L (ref 39–117)
BUN/Creatinine Ratio: 9 (ref 9–23)
BUN: 10 mg/dL (ref 6–24)
Bilirubin Total: 0.4 mg/dL (ref 0.0–1.2)
CHLORIDE: 101 mmol/L (ref 96–106)
CO2: 21 mmol/L (ref 18–29)
Calcium: 9.5 mg/dL (ref 8.7–10.2)
Creatinine, Ser: 1.13 mg/dL — ABNORMAL HIGH (ref 0.57–1.00)
GFR, EST AFRICAN AMERICAN: 61 mL/min/{1.73_m2} (ref >59)
GFR, EST NON AFRICAN AMERICAN: 53 mL/min/{1.73_m2} — AB (ref >59)
GLOBULIN, TOTAL: 2.3 g/dL (ref 1.5–4.5)
Glucose: 99 mg/dL (ref 65–99)
POTASSIUM: 4.5 mmol/L (ref 3.5–5.2)
SODIUM: 140 mmol/L (ref 134–144)
Total Protein: 6.7 g/dL (ref 6.0–8.5)

## 2016-02-12 LAB — CBC WITH DIFFERENTIAL/PLATELET
BASOS: 1 %
Basophils Absolute: 0.1 10*3/uL (ref 0.0–0.2)
EOS (ABSOLUTE): 0.2 10*3/uL (ref 0.0–0.4)
EOS: 3 %
HEMATOCRIT: 39.5 % (ref 34.0–46.6)
Hemoglobin: 13.3 g/dL (ref 11.1–15.9)
Immature Grans (Abs): 0 10*3/uL (ref 0.0–0.1)
Immature Granulocytes: 0 %
LYMPHS ABS: 2.2 10*3/uL (ref 0.7–3.1)
Lymphs: 34 %
MCH: 29.1 pg (ref 26.6–33.0)
MCHC: 33.7 g/dL (ref 31.5–35.7)
MCV: 86 fL (ref 79–97)
MONOS ABS: 0.4 10*3/uL (ref 0.1–0.9)
Monocytes: 7 %
NEUTROS ABS: 3.5 10*3/uL (ref 1.4–7.0)
NEUTROS PCT: 55 %
PLATELETS: 257 10*3/uL (ref 150–379)
RBC: 4.57 x10E6/uL (ref 3.77–5.28)
RDW: 13.4 % (ref 12.3–15.4)
WBC: 6.3 10*3/uL (ref 3.4–10.8)

## 2016-02-12 LAB — TSH: TSH: 3.24 u[IU]/mL (ref 0.450–4.500)

## 2016-02-12 NOTE — Telephone Encounter (Signed)
-----   Message from Maple Hudsonichard L Gilbert Jr., MD sent at 02/12/2016  6:46 AM EST ----- Labs stable

## 2016-02-12 NOTE — Telephone Encounter (Signed)
Advised pt of lab results. Pt verbally acknowledges understanding. Emily Drozdowski, CMA   

## 2016-02-22 ENCOUNTER — Telehealth: Payer: Self-pay

## 2016-02-22 NOTE — Telephone Encounter (Signed)
Spoke with patient. Advised as below. Per jennifer it is ok to use the hydrocortisone cream. Patient advised and would like to get a refill. I advised patient about Doxepin if you wanted patient to try. Joycelyn ManJennifer Burnette did not feel comfortable starting this medication. Please review-aa

## 2016-02-22 NOTE — Telephone Encounter (Signed)
Ok to drink pedialyte and ensure. Push fluids to stay hydrated. Bland foods such as bread, rice, applesauce, bananas, crackers, broth based soups. Continue loratadine. May take benadryl at night if symptoms are awakening her.

## 2016-02-22 NOTE — Telephone Encounter (Signed)
Patient called this morning and states she was here to see Dr Sullivan LoneGilbert on 02/11/16 and was diagnosed with urticaria. She was started on Loratadine and Prednisone if symptoms did not improve by that weekend. She did start taking Prednisone that weekend and hives/itching improved and then resolved for a day or 2 after completing Prednisone. But she developed diarrhea and nausea with taking Prednisone. She finished it on Friday she thinks last week maybe -02/19/16, she is not sure of the exact date. She is not able to keep anything down due to diarrhea. What should she try to eat? She has been drinking Pedilyte and Ensure a little bit is that ok? Also hives are back and she has re started Loratadine yesterday 02/21/16 and has actually applied hydrocortisone hemorrhoid cream she had and this helped her symptoms. Per Dr Elisabeth CaraGilbert's notes on 02/11/16 he suggested that Doxepin could be tried. Can you please review all of this and let me know what we can advise her or does it need to wait for Dr Sullivan LoneGilbert? He comes back tomorrow. Thank you=aa

## 2016-02-23 MED ORDER — TRIAMCINOLONE ACETONIDE 0.1 % EX CREA: 1.0000 "application " | TOPICAL_CREAM | Freq: Two times a day (BID) | CUTANEOUS | 0 refills | Status: DC

## 2016-02-23 NOTE — Telephone Encounter (Signed)
Pt called to see if the Rx for hydrocortisone cream had been sent to [facility] [address] or something she could try because she is itching. Pt was here for OV on 02/11/16 and wasn't able to take the  predniSONE (STERAPRED UNI-PAK 21 TAB) 10 MG (21) TBPK tablet. Pt would like a call back. Pt stated that she needs it called in by 3 today so that someone can pick it up for her on their way home. Please advise. Thanks TNP

## 2016-02-23 NOTE — Telephone Encounter (Signed)
Sent in medication as below. Advised patient. She sends her thanks.

## 2016-02-23 NOTE — Telephone Encounter (Signed)
Topical triamcinalone cream BID prn  for up to 2 weeks.

## 2016-02-29 ENCOUNTER — Other Ambulatory Visit: Payer: Self-pay

## 2016-02-29 ENCOUNTER — Telehealth: Payer: Self-pay | Admitting: Family Medicine

## 2016-02-29 MED ORDER — VORTIOXETINE HBR 20 MG PO TABS: 20.0000 mg | ORAL_TABLET | Freq: Every day | ORAL | 12 refills | Status: DC

## 2016-02-29 NOTE — Telephone Encounter (Signed)
Please review-aa 

## 2016-02-29 NOTE — Telephone Encounter (Signed)
ok 

## 2016-02-29 NOTE — Telephone Encounter (Signed)
Pt needs refill on her   Vortioxetine HBr (TRINTELLIX) 20 MG TABS once daily  Please call Raelene Bottakeda  [phone]  Thank sTeri

## 2016-03-11 ENCOUNTER — Other Ambulatory Visit: Payer: Self-pay | Admitting: Family Medicine

## 2016-03-11 DIAGNOSIS — F329 Major depressive disorder, single episode, unspecified: Secondary | ICD-10-CM

## 2016-03-11 DIAGNOSIS — F32A Depression, unspecified: Secondary | ICD-10-CM

## 2016-03-21 ENCOUNTER — Telehealth: Payer: Self-pay

## 2016-03-21 NOTE — Telephone Encounter (Signed)
[facility]. She says she has a few questions on form from [facility] that she needs to speak with Dr. Sullivan LoneGilbert nurse. KW

## 2016-03-23 NOTE — Telephone Encounter (Signed)
Spoke with patient, she had form to fill out for the doctor she will [facility] and just wanted to see if we faxed any notes on her to them, I advised patient I will check with Maralyn SagoSarah and if not I will get those faxed to them today-aa

## 2016-03-24 DIAGNOSIS — G8929 Other chronic pain: Secondary | ICD-10-CM | POA: Diagnosis not present

## 2016-03-24 DIAGNOSIS — M6289 Other specified disorders of muscle: Secondary | ICD-10-CM | POA: Diagnosis not present

## 2016-03-24 DIAGNOSIS — N816 Rectocele: Secondary | ICD-10-CM | POA: Diagnosis not present

## 2016-03-24 DIAGNOSIS — R102 Pelvic and perineal pain: Secondary | ICD-10-CM | POA: Diagnosis not present

## 2016-04-06 DIAGNOSIS — N816 Rectocele: Secondary | ICD-10-CM | POA: Diagnosis not present

## 2016-04-06 DIAGNOSIS — M62838 Other muscle spasm: Secondary | ICD-10-CM | POA: Diagnosis not present

## 2016-04-14 ENCOUNTER — Ambulatory Visit: Payer: Self-pay | Admitting: Family Medicine

## 2016-04-14 ENCOUNTER — Ambulatory Visit: Payer: Medicare HMO | Admitting: Family Medicine

## 2016-04-18 ENCOUNTER — Ambulatory Visit (INDEPENDENT_AMBULATORY_CARE_PROVIDER_SITE_OTHER): Payer: Medicare HMO | Admitting: Family Medicine

## 2016-04-18 VITALS — BP 134/80 | HR 84 | Temp 98.3°F

## 2016-04-18 DIAGNOSIS — H539 Unspecified visual disturbance: Secondary | ICD-10-CM | POA: Diagnosis not present

## 2016-04-18 DIAGNOSIS — F329 Major depressive disorder, single episode, unspecified: Secondary | ICD-10-CM | POA: Diagnosis not present

## 2016-04-18 DIAGNOSIS — R69 Illness, unspecified: Secondary | ICD-10-CM | POA: Diagnosis not present

## 2016-04-18 DIAGNOSIS — G8929 Other chronic pain: Secondary | ICD-10-CM | POA: Diagnosis not present

## 2016-04-18 MED ORDER — OXYCODONE HCL 15 MG PO TABS: 15.0000 mg | ORAL_TABLET | ORAL | 0 refills | Status: DC | PRN

## 2016-04-18 MED ORDER — METHYLPHENIDATE HCL 10 MG PO TABS: 10.0000 mg | ORAL_TABLET | Freq: Two times a day (BID) | ORAL | 0 refills | Status: DC

## 2016-04-18 NOTE — Progress Notes (Signed)
Hannah Vance  MRN: [id number] DOB: 1955/12/09  Subjective:  HPI   The patient is a 61 year old female who presents for back pain  nd depression.  She is in need of refills on her Ritalin and Oxycodone.  She states that that [facility] is going to refer her to the pain clinic but this can not be done until April.   She states she needs to talk about her depression.  She does not think the Trintellix is working well.  She later stated that it just might be that she has been out of the Ritalin for about 1 week.   She is anxious, nervous and reports crying all the time. She reports not being able to concentrate on things.      Patient Active Problem List   Diagnosis Date Noted  . Chronic pain syndrome 10/08/2015  . Absolute anemia 06/23/2014  . Anxiety 06/23/2014  . Pain in joint 06/23/2014  . 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 Clinical depression 06/23/2014  . Hypercholesteremia 06/23/2014  . Adult hypothyroidism 06/23/2014  . Cannot sleep 06/23/2014  . Billowing mitral valve 06/23/2014  . Anancastic neurosis 06/23/2014  . Avitaminosis D 06/23/2014    No past medical history on file.  Social History   Social History  . Marital status: Divorced    Spouse name: N/A  . Number of children: N/A  . Years of education: N/A   Occupational History  . Not on file.   Social History Main Topics  . Smoking status: Never Smoker  . Smokeless tobacco: Never Used  . Alcohol use No  . Drug use: No  . Sexual activity: No   Other Topics Concern  . Not on file   Social History Narrative  . No narrative on file    Outpatient Encounter Prescriptions as of 04/18/2016  Medication Sig Note  . clonazePAM (KLONOPIN) 0.5 MG tablet 1-2 three times daily   . cyclobenzaprine (FLEXERIL) 5 MG tablet Take 5 mg by mouth. 11/10/2015: [facility] Received Sig: Take 1 tablet (5 mg total) by mouth Three  (3) times a day.  . fluticasone (FLONASE) 50 MCG/ACT nasal spray PLACE 2 SPRAYS INTO BOTH NOSTRILS DAILY.   Marland Kitchen. gabapentin (NEURONTIN) 100 MG capsule Take 1 capsule (100 mg total) by mouth at bedtime.   Marland Kitchen. levothyroxine (SYNTHROID, LEVOTHROID) 25 MCG tablet TAKE 1 TABLET BY MOUTH DAILY   . methylphenidate (RITALIN) 10 MG tablet Take 1 tablet (10 mg total) by mouth 2 (two) times daily.   Marland Kitchen. oxyCODONE (ROXICODONE) 15 MG immediate release tablet Take 1 tablet (15 mg total) by mouth every 4 (four) hours as needed for pain.   . SUMAtriptan (IMITREX) 50 MG tablet Take 1 tablet (50 mg total) by mouth once. May repeat in 2 hours if headache persists or recurs.   . vortioxetine HBr (TRINTELLIX) 20 MG TABS Take 20 mg by mouth daily.   Marland Kitchen. zolpidem (AMBIEN) 10 MG tablet TAKE 1 TABLET BY MOUTH AT BEDTIME AS NEEDED   . aspirin 81 MG EC tablet Take by mouth. 06/23/2014: Received from: [facility]  . lidocaine (XYLOCAINE) 5 % ointment Insert 1/2 inch via vaginal applicator to top of vagina qhs. 11/10/2015: [facility]  . loratadine (CLARITIN) 10 MG tablet Take 1 tablet (10 mg total) by mouth daily. (Patient not taking: Reported on 04/18/2016)   . triamcinolone cream (KENALOG)  0.1 % Apply 1 application topically 2 (two) times daily. Up to 2 weeks. (Patient not taking: Reported on 04/18/2016)   . [DISCONTINUED] predniSONE (STERAPRED UNI-PAK 21 TAB) 10 MG (21) TBPK tablet As directed    No facility-administered encounter medications on file as of 04/18/2016.     Allergies  Allergen Reactions  . Amoxicillin Other (See Comments)  . Morphine Other (See Comments)    Trouble walking, heavy legs  . Septra  [Sulfamethoxazole-Trimethoprim]   . Sulfa Antibiotics Other (See Comments)    burning  . Methadone Nausea And Vomiting    Review of Systems  Constitutional: Positive for malaise/fatigue. Negative for fever.  Respiratory: Positive for shortness of breath. Negative for cough and  wheezing.   Cardiovascular: Positive for leg swelling (knees to ankles). Negative for chest pain and palpitations.  Musculoskeletal: Positive for back pain, joint pain, myalgias and neck pain.  Neurological: Positive for dizziness, weakness and headaches.  Psychiatric/Behavioral: Positive for depression. Negative for hallucinations, memory loss, substance abuse and suicidal ideas. The patient is nervous/anxious and has insomnia.     Objective:  BP 134/80 (BP Location: Right Arm, Patient Position: Sitting, Cuff Size: Normal)   Pulse 84   Temp 98.3 F (36.8 C) (Oral)   Physical Exam  Constitutional: She is well-developed, well-nourished, and in no distress.  HENT:  Head: Normocephalic and atraumatic.  Right Ear: External ear normal.  Left Ear: External ear normal.  Nose: Nose normal.  Eyes: Conjunctivae are normal. Pupils are equal, round, and reactive to light.  Neck: Normal range of motion. No thyromegaly present.  Cardiovascular: Normal rate, regular rhythm and normal heart sounds.   Pulmonary/Chest: Effort normal and breath sounds normal.  Skin: Skin is warm and dry.  Psychiatric: Mood, memory, affect and judgment normal.    Assessment and Plan :   1. Other chronic pain  - oxyCODONE (ROXICODONE) 15 MG immediate release tablet; Take 1 tablet (15 mg total) by mouth every 4 (four) hours as needed for pain.  Dispense: 180 tablet; Refill: 0 - oxyCODONE (ROXICODONE) 15 MG immediate release tablet; Take 1 tablet (15 mg total) by mouth every 4 (four) hours as needed for pain. 06/18/16  Dispense: 30 tablet; Refill: 0 - oxyCODONE (ROXICODONE) 15 MG immediate release tablet; Take 1 tablet (15 mg total) by mouth every 4 (four) hours as needed for pain. 07/19/16  Dispense: 30 tablet; Refill: 0  2. Single current episode of major depressive disorder, unspecified depression episode severity  - methylphenidate (RITALIN) 10 MG tablet; Take 1 tablet (10 mg total) by mouth 2 (two) times daily.   Dispense: 60 tablet; Refill: 0 - methylphenidate (RITALIN) 10 MG tablet; Take 1 tablet (10 mg total) by mouth 2 (two) times daily. 06/18/2016  Dispense: 60 tablet; Refill: 0 - methylphenidate (RITALIN) 10 MG tablet; Take 1 tablet (10 mg total) by mouth 2 (two) times daily. 07/19/16  Dispense: 60 tablet; Refill: 0  3. Visual disturbance Patient complains of visual disturbance that she thinks is from her Gabapentin.  Will discontinue for now and also refer to Ophthalmology.  - Ambulatory referral to Ophthalmology 4.Chronic Anxiety    HPI, Exam and A&P Transcribed under the direction and in the presence of [name]., MD. Electronically Signed: Janey Greaser, RMA I have done the exam and reviewed the chart and it is accurate to the best of my knowledge. Dentist has been used and  any errors in dictation or transcription are unintentional. [name] M.D. [facility]  

## 2016-04-28 DIAGNOSIS — N952 Postmenopausal atrophic vaginitis: Secondary | ICD-10-CM | POA: Diagnosis not present

## 2016-04-28 DIAGNOSIS — G894 Chronic pain syndrome: Secondary | ICD-10-CM | POA: Diagnosis not present

## 2016-04-28 DIAGNOSIS — R102 Pelvic and perineal pain: Secondary | ICD-10-CM | POA: Diagnosis not present

## 2016-04-28 DIAGNOSIS — G8929 Other chronic pain: Secondary | ICD-10-CM | POA: Diagnosis not present

## 2016-05-02 DIAGNOSIS — G43109 Migraine with aura, not intractable, without status migrainosus: Secondary | ICD-10-CM | POA: Diagnosis not present

## 2016-05-18 DIAGNOSIS — M62838 Other muscle spasm: Secondary | ICD-10-CM | POA: Diagnosis not present

## 2016-05-23 ENCOUNTER — Telehealth: Payer: Self-pay | Admitting: Family Medicine

## 2016-05-23 NOTE — Telephone Encounter (Signed)
Pt called needing tens unit supply  [employer]  Fax [phone]  Oval Pre-wired electrodes  She use to get these parts from Dr. Rance Muir and he's retires.  She needs these for driving to her appt pelvic floor therapy At [facility]  Pt's call back is [phone]  Thanks teri

## 2016-05-23 NOTE — Telephone Encounter (Signed)
Please review. Emily Drozdowski, CMA  

## 2016-05-24 DIAGNOSIS — M62838 Other muscle spasm: Secondary | ICD-10-CM | POA: Diagnosis not present

## 2016-05-24 NOTE — Telephone Encounter (Signed)
Busy signal still. Order faxed over.-aa

## 2016-05-24 NOTE — Telephone Encounter (Signed)
No answer-aa 

## 2016-05-24 NOTE — Telephone Encounter (Signed)
Busy signal will try in a few minutes-aa

## 2016-05-24 NOTE — Telephone Encounter (Signed)
ok 

## 2016-05-25 NOTE — Telephone Encounter (Signed)
Pt advised-aa 

## 2016-06-01 DIAGNOSIS — M62838 Other muscle spasm: Secondary | ICD-10-CM | POA: Diagnosis not present

## 2016-06-08 ENCOUNTER — Other Ambulatory Visit: Payer: Self-pay | Admitting: Family Medicine

## 2016-06-08 NOTE — Telephone Encounter (Signed)
Please review-aa 

## 2016-06-15 DIAGNOSIS — M62838 Other muscle spasm: Secondary | ICD-10-CM | POA: Diagnosis not present

## 2016-06-20 DIAGNOSIS — R69 Illness, unspecified: Secondary | ICD-10-CM | POA: Diagnosis not present

## 2016-06-27 ENCOUNTER — Other Ambulatory Visit: Payer: Self-pay | Admitting: Family Medicine

## 2016-06-27 DIAGNOSIS — F419 Anxiety disorder, unspecified: Secondary | ICD-10-CM

## 2016-07-01 ENCOUNTER — Telehealth: Payer: Self-pay

## 2016-07-01 DIAGNOSIS — F419 Anxiety disorder, unspecified: Secondary | ICD-10-CM

## 2016-07-01 MED ORDER — CLONAZEPAM 1 MG PO TABS: ORAL_TABLET | ORAL | 0 refills | Status: DC

## 2016-07-01 NOTE — Telephone Encounter (Signed)
Pharmacist advised-aa 

## 2016-07-01 NOTE — Telephone Encounter (Signed)
April from [facility] called and states that Klonopin 0.5 mg is on back order at all the [facility] pharmacies. April wanted to know if patient can get 1 mg and change the directions to take 1/2 tablet to 1 tablet TID prn? Patient rather have her medication filled with [facility] then going to a different pharmacy. Please review for Dr Aldean AstGilbert-aa

## 2016-07-01 NOTE — Telephone Encounter (Signed)
ok 

## 2016-07-06 ENCOUNTER — Ambulatory Visit: Payer: PPO | Admitting: Family Medicine

## 2016-07-06 ENCOUNTER — Ambulatory Visit (INDEPENDENT_AMBULATORY_CARE_PROVIDER_SITE_OTHER): Payer: Medicare HMO | Admitting: Family Medicine

## 2016-07-06 VITALS — BP 102/60 | HR 80 | Temp 98.5°F | Resp 16 | Wt 175.0 lb

## 2016-07-06 DIAGNOSIS — F419 Anxiety disorder, unspecified: Secondary | ICD-10-CM | POA: Diagnosis not present

## 2016-07-06 DIAGNOSIS — R5383 Other fatigue: Secondary | ICD-10-CM

## 2016-07-06 DIAGNOSIS — F329 Major depressive disorder, single episode, unspecified: Secondary | ICD-10-CM

## 2016-07-06 DIAGNOSIS — M6289 Other specified disorders of muscle: Secondary | ICD-10-CM

## 2016-07-06 DIAGNOSIS — G629 Polyneuropathy, unspecified: Secondary | ICD-10-CM

## 2016-07-06 DIAGNOSIS — E039 Hypothyroidism, unspecified: Secondary | ICD-10-CM | POA: Diagnosis not present

## 2016-07-06 DIAGNOSIS — R946 Abnormal results of thyroid function studies: Secondary | ICD-10-CM | POA: Diagnosis not present

## 2016-07-06 DIAGNOSIS — R69 Illness, unspecified: Secondary | ICD-10-CM | POA: Diagnosis not present

## 2016-07-06 MED ORDER — PAROXETINE HCL 10 MG PO TABS
10.0000 mg | ORAL_TABLET | Freq: Every day | ORAL | 11 refills | Status: DC
Start: 2016-07-06 — End: 2017-02-02

## 2016-07-06 NOTE — Progress Notes (Signed)
Patient: Hannah Vance Female    DOB: 1955-05-19   61 y.o.   MRN: [id number] Visit Date: 07/06/2016  Today's Provider: [name], MD   Chief Complaint  Patient presents with  . Hypothyroidism  . Depression   Subjective:    HPI Hannah Vance presents to office to follow up on chronic problems. No changes but depression      Hypothyroid, follow-up:  TSH  Date Value Ref Range Status  02/11/2016 3.240 0.450 - 4.500 uIU/mL Final  03/09/2015 1.360 0.450 - 4.500 uIU/mL Final  10/01/2013 1.00 0.41 - 5.90 uIU/mL Final   Wt Readings from Last 3 Encounters:  07/06/16 175 lb (79.4 kg)  02/11/16 181 lb (82.1 kg)  01/11/16 182 lb (82.6 kg)   Management since last office visit includes checking labs; TSH was stable. She reports good compliance with treatment. She is not having side effects.  She is not exercising.  She is experiencing change in energy level, nervousness and palpitations She denies heat / cold intolerance Weight trend: decreasing steadily  ------------------------------------------------------------------------   Depression  Pt is on Trintellix 20 mg po qd for this. This was prescribed by pt's psychiatrist, who is now retired. Pt has not followed up with psych since.  She reports good compliance with treatment. She feels that condition is Worse. Due to nerve damage to pelvic floor. She is not having side effects.  Depression screen [facility] 2/9 07/06/2016 08/20/2014  Decreased Interest 2 2  Down, Depressed, Hopeless 2 3  PHQ - 2 Score 4 5  Altered sleeping 2 3  Tired, decreased energy 3 3  Change in appetite 2 2  Feeling bad or failure about yourself  2 3  Trouble concentrating 2 2  Moving slowly or fidgety/restless 0 1  Suicidal thoughts 0 0  PHQ-9 Score 15 19  Difficult doing work/chores Very difficult -     ------------------------------------------------------------------------------------  Pt is c/o bilateral cool extremities. Denies  SOB, claudication. Pt does state she experiences heel pain.   Allergies  Allergen Reactions  . Amoxicillin Other (See Comments)  . Morphine Other (See Comments)    Trouble walking, heavy legs  . Septra  [Sulfamethoxazole-Trimethoprim]   . Sulfa Antibiotics Other (See Comments)    burning  . Methadone Nausea And Vomiting     Current Outpatient Prescriptions:  .  aspirin 81 MG EC tablet, Take by mouth., Disp: , Rfl:  .  clonazePAM (KLONOPIN) 1 MG tablet, Take 1/2 to 1 tablet three times daily, Disp: 180 tablet, Rfl: 0 .  conjugated estrogens (PREMARIN) vaginal cream, Place 0.5 g vaginally 2 (two) times a week., Disp: , Rfl:  .  cyclobenzaprine (FLEXERIL) 5 MG tablet, Take 5 mg by mouth., Disp: , Rfl:  .  fluticasone (FLONASE) 50 MCG/ACT nasal spray, PLACE 2 SPRAYS INTO BOTH NOSTRILS DAILY., Disp: 16 g, Rfl: 12 .  levothyroxine (SYNTHROID, LEVOTHROID) 25 MCG tablet, TAKE 1 TABLET BY MOUTH DAILY, Disp: 90 tablet, Rfl: 3 .  loratadine (CLARITIN) 10 MG tablet, Take 1 tablet (10 mg total) by mouth daily., Disp: 30 tablet, Rfl: 11 .  methylphenidate (RITALIN) 10 MG tablet, Take 1 tablet (10 mg total) by mouth 2 (two) times daily., Disp: 60 tablet, Rfl: 0 .  methylphenidate (RITALIN) 10 MG tablet, Take 1 tablet (10 mg total) by mouth 2 (two) times daily. 06/18/2016, Disp: 60 tablet, Rfl: 0 .  methylphenidate (RITALIN) 10 MG tablet, Take 1 tablet (10 mg total) by mouth 2 (  two) times daily. 07/19/16, Disp: 60 tablet, Rfl: 0 .  oxyCODONE (ROXICODONE) 15 MG immediate release tablet, Take 1 tablet (15 mg total) by mouth every 4 (four) hours as needed for pain., Disp: 180 tablet, Rfl: 0 .  oxyCODONE (ROXICODONE) 15 MG immediate release tablet, Take 1 tablet (15 mg total) by mouth every 4 (four) hours as needed for pain. 06/18/16, Disp: 30 tablet, Rfl: 0 .  oxyCODONE (ROXICODONE) 15 MG immediate release tablet, Take 1 tablet (15 mg total) by mouth every 4 (four) hours as needed for pain. 07/19/16, Disp:  30 tablet, Rfl: 0 .  SUMAtriptan (IMITREX) 50 MG tablet, Take 1 tablet (50 mg total) by mouth once. May repeat in 2 hours if headache persists or recurs., Disp: 9 tablet, Rfl: 12 .  vortioxetine HBr (TRINTELLIX) 20 MG TABS, Take 20 mg by mouth daily., Disp: 30 tablet, Rfl: 12 .  zolpidem (AMBIEN) 10 MG tablet, TAKE 1 TABLET BY MOUTH AT BEDTIME AS NEEDED, Disp: 30 tablet, Rfl: 3  Review of Systems  Constitutional: Positive for fatigue and unexpected weight change. Negative for activity change, appetite change, chills, diaphoresis and fever.  HENT: Negative.   Eyes: Negative.   Respiratory: Negative for shortness of breath.   Cardiovascular: Positive for palpitations. Negative for chest pain and leg swelling.  Gastrointestinal: Positive for constipation and diarrhea.  Endocrine: Negative for cold intolerance and heat intolerance.  Genitourinary: Negative.   Allergic/Immunologic: Negative.   Hematological: Negative.   Psychiatric/Behavioral: The patient is nervous/anxious.     Social History  Substance Use Topics  . Smoking status: Never Smoker  . Smokeless tobacco: Never Used  . Alcohol use No   Objective:   BP 102/60 (BP Location: Right Arm, Patient Position: Sitting, Cuff Size: Normal)   Pulse 80   Temp 98.5 F (36.9 C) (Oral)   Resp 16   Wt 175 lb (79.4 kg)   SpO2 97%   BMI 28.25 kg/m  Vitals:   07/06/16 1515  BP: 102/60  Pulse: 80  Resp: 16  Temp: 98.5 F (36.9 C)  TempSrc: Oral  SpO2: 97%  Weight: 175 lb (79.4 kg)     Physical Exam  Constitutional: She is oriented to person, place, and time. She appears well-developed and well-nourished.  HENT:  Head: Normocephalic and atraumatic.  Right Ear: External ear normal.  Left Ear: External ear normal.  Nose: Nose normal.  Eyes: Conjunctivae are normal.  Neck: No thyromegaly present.  Cardiovascular: Normal rate, regular rhythm and normal heart sounds.   Pulmonary/Chest: Effort normal and breath sounds normal. No  respiratory distress.  Abdominal: Bowel sounds are normal. There is tenderness.  Musculoskeletal: She exhibits no edema.  Neurological: She is alert and oriented to person, place, and time.  Skin: Skin is warm and dry.  Psychiatric: She has a normal mood and affect. Her behavior is normal. Judgment and thought content normal.        Assessment & Plan:     1. Single current episode of major depressive disorder, unspecified depression episode severity Not to goal. Will add Paxil qhs. Recheck 6 weeks.  - Vitamin B12 - PARoxetine (PAXIL) 10 MG tablet; Take 1 tablet (10 mg total) by mouth at bedtime.  Dispense: 30 tablet; Refill: 11  2. Neuropathy Will check labs. FU pending results. - CBC with Differential/Platelet - Hemoglobin A1c - Vitamin B12  3. Fatigue, unspecified type Will check labs. FU pending results. - CBC with Differential/Platelet - Vitamin B12  4. Pelvic floor dysfunction  F/B [facility].  5. Adult hypothyroidism Will check labs. FU pending results. - TSH  6. Anxiety Add Paxil and recheck 6 weeks. - PARoxetine (PAXIL) 10 MG tablet; Take 1 tablet (10 mg total) by mouth at bedtime.  Dispense: 30 tablet; Refill: 11       Osamah Schmader Wendelyn Breslow, MD  [facility]

## 2016-07-07 ENCOUNTER — Ambulatory Visit: Admission: EM | Admit: 2016-07-07 | Discharge: 2016-07-07 | Discharge status: Absent without leave | Payer: Medicare HMO

## 2016-07-07 DIAGNOSIS — R6884 Jaw pain: Secondary | ICD-10-CM | POA: Diagnosis not present

## 2016-07-07 DIAGNOSIS — Z981 Arthrodesis status: Secondary | ICD-10-CM | POA: Diagnosis not present

## 2016-07-07 DIAGNOSIS — R531 Weakness: Secondary | ICD-10-CM | POA: Diagnosis not present

## 2016-07-07 DIAGNOSIS — R51 Headache: Secondary | ICD-10-CM | POA: Diagnosis not present

## 2016-07-07 DIAGNOSIS — R410 Disorientation, unspecified: Secondary | ICD-10-CM | POA: Diagnosis not present

## 2016-07-07 DIAGNOSIS — G8929 Other chronic pain: Secondary | ICD-10-CM | POA: Diagnosis not present

## 2016-07-07 DIAGNOSIS — G4489 Other headache syndrome: Secondary | ICD-10-CM | POA: Diagnosis not present

## 2016-07-07 DIAGNOSIS — R4701 Aphasia: Secondary | ICD-10-CM | POA: Diagnosis not present

## 2016-07-07 LAB — CBC WITH DIFFERENTIAL/PLATELET
Basophils Absolute: 0 10*3/uL (ref 0.0–0.2)
Basos: 0 %
EOS (ABSOLUTE): 0.1 10*3/uL (ref 0.0–0.4)
EOS: 2 %
HEMATOCRIT: 40 % (ref 34.0–46.6)
Hemoglobin: 13.4 g/dL (ref 11.1–15.9)
Immature Grans (Abs): 0 10*3/uL (ref 0.0–0.1)
Immature Granulocytes: 0 %
LYMPHS: 36 %
Lymphocytes Absolute: 2.7 10*3/uL (ref 0.7–3.1)
MCH: 29.8 pg (ref 26.6–33.0)
MCHC: 33.5 g/dL (ref 31.5–35.7)
MCV: 89 fL (ref 79–97)
MONOCYTES: 7 %
Monocytes Absolute: 0.5 10*3/uL (ref 0.1–0.9)
Neutrophils Absolute: 4.1 10*3/uL (ref 1.4–7.0)
Neutrophils: 55 %
Platelets: 252 10*3/uL (ref 150–379)
RBC: 4.49 x10E6/uL (ref 3.77–5.28)
RDW: 13.8 % (ref 12.3–15.4)
WBC: 7.5 10*3/uL (ref 3.4–10.8)

## 2016-07-07 LAB — VITAMIN B12: Vitamin B-12: 507 pg/mL (ref 232–1245)

## 2016-07-07 LAB — HEMOGLOBIN A1C
ESTIMATED AVERAGE GLUCOSE: 111 mg/dL
Hgb A1c MFr Bld: 5.5 % (ref 4.8–5.6)

## 2016-07-07 LAB — TSH: TSH: 6.12 u[IU]/mL — AB (ref 0.450–4.500)

## 2016-07-09 LAB — T4: T4 TOTAL: 7.9 ug/dL (ref 4.5–12.0)

## 2016-07-09 LAB — SPECIMEN STATUS REPORT

## 2016-07-09 LAB — T3: T3 TOTAL: 113 ng/dL (ref 71–180)

## 2016-08-01 ENCOUNTER — Telehealth: Payer: Self-pay

## 2016-08-01 NOTE — Telephone Encounter (Signed)
Patient called wanting Ana to give her a call back. She would not go into detail. She can be reached at [phone]. Thanks!

## 2016-08-01 NOTE — Telephone Encounter (Signed)
[id number] 

## 2016-08-03 NOTE — Telephone Encounter (Signed)
Dentist is right.

## 2016-08-03 NOTE — Telephone Encounter (Signed)
Patient called concerning dental procedure she is having 08/13/2016. Patient stated that her dentist advised her she does not need an antibiotic. However pt wants to know what Dr. Elisabeth CaraGilbert's opinion is concerning this? Patient stated that she is having denture and cavity procedure. Please advise?

## 2016-08-03 NOTE — Telephone Encounter (Signed)
Please review-aa 

## 2016-08-03 NOTE — Telephone Encounter (Signed)
Pt advised-aa 

## 2016-08-15 ENCOUNTER — Encounter: Payer: Self-pay | Admitting: Family Medicine

## 2016-08-15 ENCOUNTER — Ambulatory Visit (INDEPENDENT_AMBULATORY_CARE_PROVIDER_SITE_OTHER): Payer: Medicare HMO | Admitting: Family Medicine

## 2016-08-15 VITALS — BP 108/78 | HR 72 | Temp 98.1°F | Wt 181.6 lb

## 2016-08-15 DIAGNOSIS — R69 Illness, unspecified: Secondary | ICD-10-CM | POA: Diagnosis not present

## 2016-08-15 DIAGNOSIS — S61349A Puncture wound with foreign body of unspecified finger with damage to nail, initial encounter: Secondary | ICD-10-CM | POA: Diagnosis not present

## 2016-08-15 DIAGNOSIS — Z23 Encounter for immunization: Secondary | ICD-10-CM | POA: Diagnosis not present

## 2016-08-15 NOTE — Progress Notes (Signed)
Patient: Hannah Vance Female    DOB: 24-Jan-1956   61 y.o.   MRN: [id number] Visit Date: 08/15/2016  Today's Provider: Dortha Kernennis Chrismon, PA   Chief Complaint  Patient presents with  . Hand Pain   Subjective:    Hand Pain   Incident onset: yesterday. Incident location: Larey SeatFell back in her chair and the nail off the deck punctured left index finger. Pain location: left index finger. The quality of the pain is described as aching. The pain does not radiate. The symptoms are aggravated by movement (bending). Treatments tried: neosporin and bandage. The treatment provided mild relief.   Patient Active Problem List   Diagnosis Date Noted  . Neuropathy 07/06/2016  . Fatigue 07/06/2016  . Pelvic floor dysfunction 03/24/2016  . Chronic pain syndrome 10/08/2015  . Chronic pelvic pain in female 07/01/2014  . Absolute anemia 06/23/2014  . Anxiety 06/23/2014  . Pain in joint 06/23/2014  . 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 Clinical depression 06/23/2014  . Hypercholesteremia 06/23/2014  . Adult hypothyroidism 06/23/2014  . Cannot sleep 06/23/2014  . Billowing mitral valve 06/23/2014  . Anancastic neurosis 06/23/2014  . Avitaminosis D 06/23/2014   Past Surgical History:  Procedure Laterality Date  . ABDOMINAL HYSTERECTOMY    . CERVICAL DISCECTOMY     C5-C6-fusion and plating in the neck  . TONSILLECTOMY     Family History  Problem Relation Age of Onset  . Arthritis Mother   . Stroke Mother   . Polycystic kidney disease Mother   . Heart attack Father   . Dementia Father   . Hypertension Father   . Aneurysm Father    Allergies  Allergen Reactions  . Amoxicillin Other (See Comments)  . Morphine Other (See Comments)    Trouble walking, heavy legs  . Septra  [Sulfamethoxazole-Trimethoprim]   . Sulfa Antibiotics Other (See Comments)    burning  . Methadone Nausea And Vomiting   Previous Medications   ASPIRIN 81 MG EC  TABLET    Take by mouth.   CLONAZEPAM (KLONOPIN) 1 MG TABLET    Take 1/2 to 1 tablet three times daily   CONJUGATED ESTROGENS (PREMARIN) VAGINAL CREAM    Place 0.5 g vaginally 2 (two) times a week.   CYCLOBENZAPRINE (FLEXERIL) 5 MG TABLET    Take 5 mg by mouth.   FLUTICASONE (FLONASE) 50 MCG/ACT NASAL SPRAY    PLACE 2 SPRAYS INTO BOTH NOSTRILS DAILY.   LEVOTHYROXINE (SYNTHROID, LEVOTHROID) 25 MCG TABLET    TAKE 1 TABLET BY MOUTH DAILY   LORATADINE (CLARITIN) 10 MG TABLET    Take 1 tablet (10 mg total) by mouth daily.   METHYLPHENIDATE (RITALIN) 10 MG TABLET    Take 1 tablet (10 mg total) by mouth 2 (two) times daily.   METHYLPHENIDATE (RITALIN) 10 MG TABLET    Take 1 tablet (10 mg total) by mouth 2 (two) times daily. 06/18/2016   METHYLPHENIDATE (RITALIN) 10 MG TABLET    Take 1 tablet (10 mg total) by mouth 2 (two) times daily. 07/19/16   OXYCODONE (ROXICODONE) 15 MG IMMEDIATE RELEASE TABLET    Take 1 tablet (15 mg total) by mouth every 4 (four) hours as needed for pain.   OXYCODONE (ROXICODONE) 15 MG IMMEDIATE RELEASE TABLET    Take 1 tablet (15 mg total) by mouth every 4 (four) hours as needed for pain. 06/18/16   OXYCODONE (ROXICODONE) 15 MG IMMEDIATE RELEASE TABLET    Take  1 tablet (15 mg total) by mouth every 4 (four) hours as needed for pain. 07/19/16   PAROXETINE (PAXIL) 10 MG TABLET    Take 1 tablet (10 mg total) by mouth at bedtime.   SUMATRIPTAN (IMITREX) 50 MG TABLET    Take 1 tablet (50 mg total) by mouth once. May repeat in 2 hours if headache persists or recurs.   VORTIOXETINE HBR (TRINTELLIX) 20 MG TABS    Take 20 mg by mouth daily.   ZOLPIDEM (AMBIEN) 10 MG TABLET    TAKE 1 TABLET BY MOUTH AT BEDTIME AS NEEDED    Review of Systems  Constitutional: Negative.   Respiratory: Negative.   Cardiovascular: Negative.     Social History  Substance Use Topics  . Smoking status: Never Smoker  . Smokeless tobacco: Never Used  . Alcohol use No   Objective:   BP 108/78 (BP Location:  Right Arm, Patient Position: Sitting, Cuff Size: Normal)   Pulse 72   Temp 98.1 F (36.7 C) (Oral)   Wt 181 lb 9.6 oz (82.4 kg)   SpO2 95%   BMI 29.31 kg/m   Physical Exam  Constitutional: She is oriented to person, place, and time. She appears well-developed and well-nourished.  Musculoskeletal: Normal range of motion. She exhibits tenderness.       Arms: Pinpoint puncture and slight tear in the left index finger nail near the eponychium. No active bleeding but very tender.  Neurological: She is alert and oriented to person, place, and time.  Psychiatric: Her mood appears anxious.       Assessment & Plan:     1. Puncture wound of finger with foreign body with damage to nail, initial encounter Tipped backward in her chair and hit the left index finger nail on a nail in her deck at home yesterday. Caused a pinpoint puncture/tear in the proximal finger nail near the eponychium. No subungual hematoma or active bleeding today. Continue Neosporin and Band-Aid dressing. Will give Tdap today and advised to recheck if any signs of infection.   2. Need for tetanus, diphtheria, and acellular pertussis (Tdap) vaccine - Tdap vaccine greater than or equal to 7yo IM

## 2016-08-17 ENCOUNTER — Ambulatory Visit (INDEPENDENT_AMBULATORY_CARE_PROVIDER_SITE_OTHER): Payer: Medicare HMO | Admitting: Family Medicine

## 2016-08-17 VITALS — BP 112/62 | HR 74 | Temp 98.9°F | Resp 12 | Wt 181.0 lb

## 2016-08-17 DIAGNOSIS — F329 Major depressive disorder, single episode, unspecified: Secondary | ICD-10-CM | POA: Diagnosis not present

## 2016-08-17 DIAGNOSIS — G8929 Other chronic pain: Secondary | ICD-10-CM | POA: Diagnosis not present

## 2016-08-17 DIAGNOSIS — M6289 Other specified disorders of muscle: Secondary | ICD-10-CM | POA: Diagnosis not present

## 2016-08-17 DIAGNOSIS — M7662 Achilles tendinitis, left leg: Secondary | ICD-10-CM | POA: Diagnosis not present

## 2016-08-17 DIAGNOSIS — R5383 Other fatigue: Secondary | ICD-10-CM

## 2016-08-17 DIAGNOSIS — F419 Anxiety disorder, unspecified: Secondary | ICD-10-CM | POA: Diagnosis not present

## 2016-08-17 DIAGNOSIS — R69 Illness, unspecified: Secondary | ICD-10-CM | POA: Diagnosis not present

## 2016-08-17 MED ORDER — CLONAZEPAM 1 MG PO TABS: ORAL_TABLET | ORAL | 2 refills | Status: DC

## 2016-08-17 NOTE — Progress Notes (Signed)
Hannah Vance  MRN: [id number] DOB: 1955-02-20  Subjective:  HPI  Patient is here to follow up on depression/anxiety. Last office visit was on 07/06/16 and at that time Paxil was added. Patient states she is feeling better emotionally since adding Paxil. She did have a bad taste in her mouth and then numbness sensation on her tongue for about 3 to 4 days after starting the medication but is doing fine now without those sensations. [facility] 08/17/2016 07/06/2016 08/20/2014  Decreased Interest 1 2 2   Down, Depressed, Hopeless 2 2 3   PHQ - 2 Score 3 4 5   Altered sleeping 1 2 3   Tired, decreased energy 2 3 3   Change in appetite 1 2 2   Feeling bad or failure about yourself  1 2 3   Trouble concentrating 1 2 2   Moving slowly or fidgety/restless 0 0 1  Suicidal thoughts 0 0 0  PHQ-9 Score 9 15 19   Difficult doing work/chores - Very difficult -   Patient Active Problem List   Diagnosis Date Noted  . Neuropathy 07/06/2016  . Fatigue 07/06/2016  . Pelvic floor dysfunction 03/24/2016  . Chronic pain syndrome 10/08/2015  . Chronic pelvic pain in female 07/01/2014  . Absolute anemia 06/23/2014  . Anxiety 06/23/2014  . Pain in joint 06/23/2014  . 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 Clinical depression 06/23/2014  . Hypercholesteremia 06/23/2014  . Adult hypothyroidism 06/23/2014  . Cannot sleep 06/23/2014  . Billowing mitral valve 06/23/2014  . Anancastic neurosis 06/23/2014  . Avitaminosis D 06/23/2014    No past medical history on file.  Social History   Social History  . Marital status: Divorced    Spouse name: N/A  . Number of children: N/A  . Years of education: N/A   Occupational History  . Not on file.   Social History Main Topics  . Smoking status: Never Smoker  . Smokeless tobacco: Never Used  . Alcohol use No  . Drug use: No  . Sexual activity: No   Other Topics Concern  . Not on file     Social History Narrative  . No narrative on file    Outpatient Encounter Prescriptions as of 08/17/2016  Medication Sig Note  . aspirin 81 MG EC tablet Take by mouth. 06/23/2014: [facility]  . clonazePAM (KLONOPIN) 1 MG tablet Take 1/2 to 1 tablet three times daily   . conjugated estrogens (PREMARIN) vaginal cream Place 0.5 g vaginally 2 (two) times a week.   . cyclobenzaprine (FLEXERIL) 5 MG tablet Take 5 mg by mouth. 11/10/2015: [facility] Received Sig: Take 1 tablet (5 mg total) by mouth Three (3) times a day.  . fluticasone (FLONASE) 50 MCG/ACT nasal spray PLACE 2 SPRAYS INTO BOTH NOSTRILS DAILY.   Marland Kitchen. levothyroxine (SYNTHROID, LEVOTHROID) 25 MCG tablet TAKE 1 TABLET BY MOUTH DAILY   . loratadine (CLARITIN) 10 MG tablet Take 1 tablet (10 mg total) by mouth daily.   . methylphenidate (RITALIN) 10 MG tablet Take 1 tablet (10 mg total) by mouth 2 (two) times daily.   . methylphenidate (RITALIN) 10 MG tablet Take 1 tablet (10 mg total) by mouth 2 (two) times daily. 06/18/2016   . methylphenidate (RITALIN) 10 MG tablet Take 1 tablet (10 mg total) by mouth 2 (two) times daily. 07/19/16   . oxyCODONE (ROXICODONE) 15 MG immediate release tablet Take 1 tablet (15 mg total) by mouth  every 4 (four) hours as needed for pain.   Marland Kitchen oxyCODONE (ROXICODONE) 15 MG immediate release tablet Take 1 tablet (15 mg total) by mouth every 4 (four) hours as needed for pain. 06/18/16   . oxyCODONE (ROXICODONE) 15 MG immediate release tablet Take 1 tablet (15 mg total) by mouth every 4 (four) hours as needed for pain. 07/19/16   . PARoxetine (PAXIL) 10 MG tablet Take 1 tablet (10 mg total) by mouth at bedtime.   . SUMAtriptan (IMITREX) 50 MG tablet Take 1 tablet (50 mg total) by mouth once. May repeat in 2 hours if headache persists or recurs.   . vortioxetine HBr (TRINTELLIX) 20 MG TABS Take 20 mg by mouth daily.   Marland Kitchen zolpidem (AMBIEN) 10 MG tablet TAKE 1 TABLET BY  MOUTH AT BEDTIME AS NEEDED    No facility-administered encounter medications on file as of 08/17/2016.     Allergies  Allergen Reactions  . Amoxicillin Other (See Comments)  . Morphine Other (See Comments)    Trouble walking, heavy legs  . Septra  [Sulfamethoxazole-Trimethoprim]   . Sulfa Antibiotics Other (See Comments)    burning  . Methadone Nausea And Vomiting    Review of Systems  Constitutional: Positive for malaise/fatigue.  Respiratory: Negative.   Cardiovascular: Negative.   Genitourinary: Positive for frequency and urgency.  Musculoskeletal: Positive for back pain and joint pain.  Neurological: Positive for headaches.  Psychiatric/Behavioral: Positive for depression. The patient is nervous/anxious.        Better    Objective:  BP 112/62   Pulse 74   Temp 98.9 F (37.2 C)   Resp 12   Wt 181 lb (82.1 kg)   BMI 29.21 kg/m   Physical Exam  Constitutional: She is oriented to person, place, and time and well-developed, well-nourished, and in no distress.  HENT:  Head: Normocephalic and atraumatic.  Eyes: Conjunctivae are normal. Pupils are equal, round, and reactive to light.  Neck: Normal range of motion. Neck supple.  Cardiovascular: Normal rate, regular rhythm, normal heart sounds and intact distal pulses.  Exam reveals no gallop.   No murmur heard. Pulmonary/Chest: Effort normal and breath sounds normal. No respiratory distress. She has no wheezes.  Neurological: She is alert and oriented to person, place, and time.    Assessment and Plan :  1. Single current episode of major depressive disorder, unspecified depression episode severity Better. PHQ 9 score 9 better. Discussed with patient how to try to correct her sleeping pattern at this time.  2. Fatigue, unspecified type  3. Pelvic floor dysfunction  4. Acute anxiety Better today.  5. Other chronic pain  6. Achilles bursitis of left lower extremity  HPI, Exam and A&P transcribed by Samara Deist, RMA under direction and in the presence of [name], MD. I have done the exam and reviewed the chart and it is accurate to the best of my knowledge. Dentist has been used and  any errors in dictation or transcription are unintentional. [name] M.D. [facility]

## 2016-08-18 ENCOUNTER — Telehealth: Payer: Self-pay | Admitting: Emergency Medicine

## 2016-08-18 DIAGNOSIS — G8929 Other chronic pain: Secondary | ICD-10-CM

## 2016-08-18 MED ORDER — OXYCODONE HCL 15 MG PO TABS: 15.0000 mg | ORAL_TABLET | ORAL | 0 refills | Status: DC | PRN

## 2016-08-18 NOTE — Telephone Encounter (Signed)
Pt called stating that her Oxycodone rx's were only written for 30 pills, she usually gets 180. She got one of the 30 day rx filled and she has one on file at the the pharmacy. Do you want her to have the 180 pills. If so how do you want to go about getting the additional pills since she filled one rx and the pharmacy has the other. Please advise.

## 2016-08-18 NOTE — Telephone Encounter (Signed)
rx ready to be picked up

## 2016-08-18 NOTE — Telephone Encounter (Signed)
Printed, awaiting signature.  

## 2016-08-18 NOTE — Telephone Encounter (Signed)
180 ok for now.

## 2016-09-05 ENCOUNTER — Telehealth: Payer: Self-pay | Admitting: Family Medicine

## 2016-09-05 NOTE — Telephone Encounter (Signed)
Left message about need to schedule AWV.  She could be scheduled  12/20/16 at 2:30 if she would like-ab

## 2016-09-13 ENCOUNTER — Telehealth: Payer: Self-pay | Admitting: Family Medicine

## 2016-09-13 NOTE — Telephone Encounter (Signed)
Pt request that Darien Ramusna return her call.; Pt would like to ask Darien Ramusna a question about her medications. Pt was advised that Dr. Sullivan LoneGilbert is out of the office this week. Please advise. Thanks TNP

## 2016-09-14 DIAGNOSIS — M62838 Other muscle spasm: Secondary | ICD-10-CM | POA: Diagnosis not present

## 2016-09-22 DIAGNOSIS — M62838 Other muscle spasm: Secondary | ICD-10-CM | POA: Diagnosis not present

## 2016-09-26 ENCOUNTER — Telehealth: Payer: Self-pay | Admitting: Family Medicine

## 2016-09-26 NOTE — Telephone Encounter (Signed)
Left message to see if she could come in early on 12/20/16 to do awv-ab

## 2016-09-28 DIAGNOSIS — M62838 Other muscle spasm: Secondary | ICD-10-CM | POA: Diagnosis not present

## 2016-10-04 ENCOUNTER — Other Ambulatory Visit: Payer: Self-pay | Admitting: Family Medicine

## 2016-10-04 DIAGNOSIS — G8929 Other chronic pain: Secondary | ICD-10-CM

## 2016-10-04 DIAGNOSIS — F329 Major depressive disorder, single episode, unspecified: Secondary | ICD-10-CM

## 2016-10-04 NOTE — Telephone Encounter (Signed)
Pt contacted office for refill request on the following medications:  methylphenidate (RITALIN) 10 MG tablet   oxyCODONE (ROXICODONE) 15 MG immediate release tablet  CB#[phone]/MW

## 2016-10-05 MED ORDER — METHYLPHENIDATE HCL 10 MG PO TABS: 10.0000 mg | ORAL_TABLET | Freq: Two times a day (BID) | ORAL | 0 refills | Status: DC

## 2016-10-05 MED ORDER — OXYCODONE HCL 15 MG PO TABS: 15.0000 mg | ORAL_TABLET | ORAL | 0 refills | Status: DC | PRN

## 2016-10-06 DIAGNOSIS — M62838 Other muscle spasm: Secondary | ICD-10-CM | POA: Diagnosis not present

## 2016-10-26 ENCOUNTER — Telehealth: Payer: Self-pay

## 2016-10-26 DIAGNOSIS — S40022A Contusion of left upper arm, initial encounter: Secondary | ICD-10-CM | POA: Diagnosis not present
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
## 2016-10-27 DIAGNOSIS — S40022D Contusion of left upper arm, subsequent encounter: Secondary | ICD-10-CM | POA: Diagnosis not present

## 2016-10-27 DIAGNOSIS — M62838 Other muscle spasm: Secondary | ICD-10-CM | POA: Diagnosis not present

## 2016-10-28 ENCOUNTER — Encounter: Payer: Self-pay | Admitting: Physician Assistant

## 2016-10-28 ENCOUNTER — Ambulatory Visit (INDEPENDENT_AMBULATORY_CARE_PROVIDER_SITE_OTHER): Payer: Medicare HMO | Admitting: Physician Assistant

## 2016-10-28 VITALS — BP 118/68 | HR 88 | Temp 99.0°F | Resp 16 | Wt 179.0 lb

## 2016-10-28 DIAGNOSIS — L237 Allergic contact dermatitis due to plants, except food: Secondary | ICD-10-CM | POA: Diagnosis not present

## 2016-10-28 MED ORDER — PREDNISONE 10 MG (21) PO TBPK: ORAL_TABLET | ORAL | 0 refills | Status: DC
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
## 2016-10-28 NOTE — Patient Instructions (Signed)
Contact Dermatitis Dermatitis is redness, soreness, and swelling (inflammation) of the skin. Contact dermatitis is a reaction to certain substances that touch the skin. You either touched something that irritated your skin, or you have allergies to something you touched. Follow these instructions at home: Skin Care  Moisturize your skin as needed.  Apply cool compresses to the affected areas.  Try taking a bath with: ? Epsom salts. Follow the instructions on the package. You can get these at a pharmacy or grocery store. ? Baking soda. Pour a small amount into the bath as told by your doctor. ? Colloidal oatmeal. Follow the instructions on the package. You can get this at a pharmacy or grocery store.  Try applying baking soda paste to your skin. Stir water into baking soda until it looks like paste.  Do not scratch your skin.  Bathe less often.  Bathe in lukewarm water. Avoid using hot water. Medicines  Take or apply over-the-counter and prescription medicines only as told by your doctor.  If you were prescribed an antibiotic medicine, take or apply your antibiotic as told by your doctor. Do not stop taking the antibiotic even if your condition starts to get better. General instructions  Keep all follow-up visits as told by your doctor. This is important.  Avoid the substance that caused your reaction. If you do not know what caused it, keep a journal to try to track what caused it. Write down: ? What you eat. ? What cosmetic products you use. ? What you drink. ? What you wear in the affected area. This includes jewelry.  If you were given a bandage (dressing), take care of it as told by your doctor. This includes when to change and remove it. Contact a doctor if:  You do not get better with treatment.  Your condition gets worse.  You have signs of infection such as: ? Swelling. ? Tenderness. ? Redness. ? Soreness. ? Warmth.  You have a fever.  You have new  symptoms. Get help right away if:  You have a very bad headache.  You have neck pain.  Your neck is stiff.  You throw up (vomit).  You feel very sleepy.  You see red streaks coming from the affected area.  Your bone or joint underneath the affected area becomes painful after the skin has healed.  The affected area turns darker.  You have trouble breathing. This information is not intended to replace advice given to you by your health care provider. Make sure you discuss any questions you have with your health care provider. Document Released: 11/21/2008 Document Revised: 07/02/2015 Document Reviewed: 06/11/2014 Elsevier Interactive Patient Education  2018 Elsevier Inc.  

## 2016-11-03 ENCOUNTER — Ambulatory Visit (INDEPENDENT_AMBULATORY_CARE_PROVIDER_SITE_OTHER): Payer: Medicare HMO | Admitting: Family Medicine

## 2016-11-03 ENCOUNTER — Encounter: Payer: Self-pay | Admitting: Family Medicine

## 2016-11-03 DIAGNOSIS — F329 Major depressive disorder, single episode, unspecified: Secondary | ICD-10-CM | POA: Diagnosis not present

## 2016-11-03 DIAGNOSIS — R69 Illness, unspecified: Secondary | ICD-10-CM | POA: Diagnosis not present

## 2016-11-03 DIAGNOSIS — M62838 Other muscle spasm: Secondary | ICD-10-CM | POA: Diagnosis not present

## 2016-11-03 MED ORDER — METHYLPHENIDATE HCL 10 MG PO TABS: 10.0000 mg | ORAL_TABLET | Freq: Two times a day (BID) | ORAL | 0 refills | Status: DC
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
## 2016-11-07 ENCOUNTER — Other Ambulatory Visit: Payer: Self-pay | Admitting: Family Medicine

## 2016-11-08 DIAGNOSIS — R69 Illness, unspecified: Secondary | ICD-10-CM | POA: Diagnosis not present

## 2016-11-10 DIAGNOSIS — M62838 Other muscle spasm: Secondary | ICD-10-CM | POA: Diagnosis not present

## 2016-12-01 DIAGNOSIS — M62838 Other muscle spasm: Secondary | ICD-10-CM | POA: Diagnosis not present

## 2016-12-07 DIAGNOSIS — N816 Rectocele: Secondary | ICD-10-CM | POA: Diagnosis not present

## 2016-12-07 DIAGNOSIS — G894 Chronic pain syndrome: Secondary | ICD-10-CM | POA: Diagnosis not present

## 2016-12-07 DIAGNOSIS — R69 Illness, unspecified: Secondary | ICD-10-CM | POA: Diagnosis not present

## 2016-12-07 DIAGNOSIS — G8929 Other chronic pain: Secondary | ICD-10-CM | POA: Diagnosis not present

## 2016-12-07 DIAGNOSIS — R102 Pelvic and perineal pain: Secondary | ICD-10-CM | POA: Diagnosis not present

## 2016-12-08 DIAGNOSIS — M62838 Other muscle spasm: Secondary | ICD-10-CM | POA: Diagnosis not present

## 2016-12-19 ENCOUNTER — Ambulatory Visit: Payer: Self-pay | Admitting: Family Medicine

## 2016-12-20 ENCOUNTER — Ambulatory Visit (INDEPENDENT_AMBULATORY_CARE_PROVIDER_SITE_OTHER): Payer: Medicare HMO | Admitting: Family Medicine

## 2016-12-20 ENCOUNTER — Encounter: Payer: Self-pay | Admitting: Family Medicine

## 2016-12-20 VITALS — BP 138/78 | HR 100 | Temp 98.6°F | Resp 18 | Wt 183.0 lb

## 2016-12-20 DIAGNOSIS — F329 Major depressive disorder, single episode, unspecified: Secondary | ICD-10-CM | POA: Diagnosis not present

## 2016-12-20 DIAGNOSIS — J40 Bronchitis, not specified as acute or chronic: Secondary | ICD-10-CM | POA: Diagnosis not present

## 2016-12-20 DIAGNOSIS — R69 Illness, unspecified: Secondary | ICD-10-CM | POA: Diagnosis not present

## 2016-12-20 MED ORDER — AZITHROMYCIN 250 MG PO TABS: ORAL_TABLET | ORAL | 0 refills | Status: DC

## 2016-12-20 NOTE — Progress Notes (Signed)
Hannah Vance  MRN: [id number] DOB: Jul 19, 1955  Subjective:  HPI   The patient is a 61 year old female who presents for follow up of chronic issues.  She was last seen on 11/03/16 for an acute visit.    Depression/Anxiety-Patient is on Trintellix and Paxil.  She states she does not know if it is helping.  She states that she is not sleeping well and that the Zolpidem causes her to have bad dreams.  She states if she takes 1 Clonzepam and her Paxil together before bed she sleeps better.  If she wakes during the night she will take another Clonazepam.  This happens sometimes twice during the night. She may take one during the day once or twice a week.    Chronic pain-Patient [facility] stated that they could not do anything else for her.   Cough-patient has had cough for about 1 1/2 weeks.  Her cough is productive but she does not know if there is color to the phlegm.  She is using Delsym.   Patient Active Problem List   Diagnosis Date Noted  . Neuropathy 07/06/2016  . Fatigue 07/06/2016  . Pelvic floor dysfunction 03/24/2016  . Chronic pain syndrome 10/08/2015  . Chronic pelvic pain in female 07/01/2014  . Absolute anemia 06/23/2014  . Anxiety 06/23/2014  . Pain in joint 06/23/2014  . 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 Clinical depression 06/23/2014  . Hypercholesteremia 06/23/2014  . Adult hypothyroidism 06/23/2014  . Cannot sleep 06/23/2014  . Billowing mitral valve 06/23/2014  . Anancastic neurosis 06/23/2014  . Avitaminosis D 06/23/2014    No past medical history on file.  Social History   Socioeconomic History  . Marital status: Divorced    Spouse name: Not on file  . Number of children: Not on file  . Years of education: Not on file  . Highest education level: Not on file  Social Needs  . Financial resource strain: Not on file  . Food insecurity - worry: Not on file  .  Food insecurity - inability: Not on file  . Transportation needs - medical: Not on file  . Transportation needs - non-medical: Not on file  Occupational History  . Not on file  Tobacco Use  . Smoking status: Never Smoker  . Smokeless tobacco: Never Used  Substance and Sexual Activity  . Alcohol use: No    Alcohol/week: 0.0 oz  . Drug use: No  . Sexual activity: No  Other Topics Concern  . Not on file  Social History Narrative  . Not on file    Outpatient Encounter Medications as of 12/20/2016  Medication Sig Note  . aspirin 81 MG EC tablet Take by mouth. 06/23/2014: Received from: [facility]  . clonazePAM (KLONOPIN) 1 MG tablet Take 1/2 to 1 tablet three times daily   . conjugated estrogens (PREMARIN) vaginal cream Place 0.5 g vaginally 2 (two) times a week.   . cyclobenzaprine (FLEXERIL) 5 MG tablet Take 5 mg by mouth. 11/10/2015: [facility] from: [facility] Received Sig: Take 1 tablet (5 mg total) by mouth Three (3) times a day.  . fluticasone (FLONASE) 50 MCG/ACT nasal spray PLACE 2 SPRAYS INTO BOTH NOSTRILS DAILY.   Marland Kitchen. levothyroxine (SYNTHROID, LEVOTHROID) 25 MCG tablet TAKE 1 TABLET BY MOUTH DAILY   . methylphenidate (RITALIN) 10 MG tablet Take 1 tablet (10 mg total) by mouth 2 (two)  times daily. 07/19/16   . methylphenidate (RITALIN) 10 MG tablet Take 1 tablet (10 mg total) by mouth 2 (two) times daily. 07/19/16   . methylphenidate (RITALIN) 10 MG tablet Take 1 tablet (10 mg total) by mouth 2 (two) times daily. 07/19/16   . oxyCODONE (ROXICODONE) 15 MG immediate release tablet Take 1 tablet (15 mg total) by mouth every 4 (four) hours as needed for pain. 07/19/16   . oxyCODONE (ROXICODONE) 15 MG immediate release tablet Take 1 tablet (15 mg total) by mouth every 4 (four) hours as needed for pain.   Marland Kitchen oxyCODONE (ROXICODONE) 15 MG immediate release tablet Take 1 tablet (15 mg total) by mouth every 4 (four) hours as needed for pain.   Marland Kitchen PARoxetine (PAXIL) 10 MG  tablet Take 1 tablet (10 mg total) by mouth at bedtime.   . SUMAtriptan (IMITREX) 50 MG tablet Take 1 tablet (50 mg total) by mouth once. May repeat in 2 hours if headache persists or recurs.   . vortioxetine HBr (TRINTELLIX) 20 MG TABS Take 20 mg by mouth daily.   Marland Kitchen zolpidem (AMBIEN) 10 MG tablet TAKE 1 TABLET BY MOUTH AT BEDTIME   . [DISCONTINUED] loratadine (CLARITIN) 10 MG tablet Take 1 tablet (10 mg total) by mouth daily.   . [DISCONTINUED] predniSONE (STERAPRED UNI-PAK 21 TAB) 10 MG (21) TBPK tablet Take 6 pills on day 1, then 5 pills on day 2, and so on    No facility-administered encounter medications on file as of 12/20/2016.     Allergies  Allergen Reactions  . Amoxicillin Other (See Comments)  . Morphine Other (See Comments)    Trouble walking, heavy legs  . Septra  [Sulfamethoxazole-Trimethoprim]   . Sulfa Antibiotics Other (See Comments)    burning  . Methadone Nausea And Vomiting    Review of Systems  Constitutional: Positive for chills, diaphoresis, fever and malaise/fatigue.  HENT: Positive for congestion, ear pain and sinus pain. Negative for ear discharge, hearing loss, sore throat and tinnitus.   Eyes: Positive for photophobia. Negative for blurred vision, double vision, pain, discharge and redness.  Respiratory: Positive for cough, sputum production, shortness of breath and wheezing. Negative for hemoptysis.   Cardiovascular: Positive for chest pain (burning). Negative for palpitations, orthopnea, claudication and leg swelling.  Neurological: Positive for headaches.    Objective:  BP 138/78 (BP Location: Left Arm, Patient Position: Sitting, Cuff Size: Normal)   Pulse 100   Temp 98.6 F (37 C) (Oral)   Resp 18   Wt 183 lb (83 kg)   BMI 29.54 kg/m   Physical Exam  Constitutional: She is oriented to person, place, and time and well-developed, well-nourished, and in no distress.  HENT:  Head: Normocephalic and atraumatic.  Eyes: Conjunctivae are normal.  Pupils are equal, round, and reactive to light.  Neck: Normal range of motion.  Cardiovascular: Normal rate, regular rhythm and normal heart sounds.  Pulmonary/Chest: Effort normal and breath sounds normal.  Neurological: She is alert and oriented to person, place, and time. Gait normal. GCS score is 15.  Skin: Skin is warm and dry.  Psychiatric: Mood, memory and affect normal.    Assessment and Plan :  1. Bronchitis  - azithromycin (ZITHROMAX) 250 MG tablet; 2 PO day 1 then 1 daily  Dispense: 6 tablet; Refill: 0  2. Current episode of major depressive disorder without prior episode, unspecified depression episode severity Pt really needs psychiatric care. - Ambulatory referral to Psychiatry

## 2016-12-22 ENCOUNTER — Telehealth: Payer: Self-pay | Admitting: Family Medicine

## 2016-12-22 NOTE — Telephone Encounter (Signed)
Pt stated that Dr Maryruth BunKapur isn't in network with her insurance and pt is requesting that Dr. Sullivan LoneGilbert contact Dr. Maryruth BunKapur and see if she would see pt for pt's regular co-pay as if she was in network. Pt stated that her insurance is in network with only one office in the area and she would rather not go there. Please advise. Thanks TNP

## 2016-12-22 NOTE — Telephone Encounter (Signed)
Please review-Hannah Vance [name], RMA  

## 2016-12-27 NOTE — Telephone Encounter (Signed)
I do not think she can legally accept different copay.

## 2016-12-27 NOTE — Telephone Encounter (Signed)
lmtcb-Tiphani Mells V Emmeline Winebarger, RMA  

## 2016-12-28 NOTE — Telephone Encounter (Signed)
lmtcb-Anastasiya V Hopkins, RMA  

## 2016-12-28 NOTE — Telephone Encounter (Signed)
Pt returned call. Thanks TNP °

## 2017-01-02 NOTE — Telephone Encounter (Signed)
Pt is returning call.  NU#[phone]/HKCB#[phone]/MW

## 2017-01-02 NOTE — Telephone Encounter (Signed)
Pt advised-[name] [name], RMA  

## 2017-01-05 DIAGNOSIS — M62838 Other muscle spasm: Secondary | ICD-10-CM | POA: Diagnosis not present

## 2017-01-10 ENCOUNTER — Telehealth: Payer: Self-pay

## 2017-01-10 ENCOUNTER — Other Ambulatory Visit: Payer: Self-pay

## 2017-01-10 ENCOUNTER — Telehealth: Payer: Self-pay | Admitting: Family Medicine

## 2017-01-10 DIAGNOSIS — F329 Major depressive disorder, single episode, unspecified: Secondary | ICD-10-CM

## 2017-01-10 DIAGNOSIS — G8929 Other chronic pain: Secondary | ICD-10-CM

## 2017-01-10 NOTE — Telephone Encounter (Signed)
Pt's insurance called needing clarification on Prior Auth to see Dr. Maryruth BunKapur.

## 2017-01-10 NOTE — Telephone Encounter (Signed)
Spoke with [employer]. Trying to get patient's copay to be in network and lower copay to help patient financially. Spoke with insurance and patient. Insurance will make that decision and will notify Dr Shelda AltesKapur's office.  Authorization number for this case is [id number]-Taro Hidrogo Ander PurpuraV Sean Malinowski, RMA

## 2017-01-10 NOTE — Telephone Encounter (Signed)
Patient asking if she can get her Ritalin and Oxycodone refilled. Her next appointment is scheduled for February. Patient was seen in November last time. Please advise-Anastasiya V Hopkins, RMA She said usually gets refills x 3

## 2017-01-10 NOTE — Telephone Encounter (Signed)
1)Patient was seen in November and was diagnosed with bronchitis and started on Zpak. She finished this and is having a deep dry cough still but not all the time, nasal congestion present. No fever or any other symptoms with this. Anything else she can try or take for this?  2)Also patient was on Prednisone recently for poison oak and then Zpak for bronchitis and is using estrogen that she has to insert vaginally. The past week or more she has had burning inher vaginal area, soreness on the outside and some internally and swelling. She thinks she may have yeast infection or irritation from one of these medications maybe. She asked pharmacist and they gave her probiotic/yeast tablets and cream to apply to the outside of the affected area and has been doing the past 3 days. Symptoms are not much better. Any suggestions for this? Thank Neta Mendsyou-Anastasiya V Hopkins, RMA

## 2017-01-12 DIAGNOSIS — M62838 Other muscle spasm: Secondary | ICD-10-CM | POA: Diagnosis not present

## 2017-01-12 MED ORDER — METHYLPHENIDATE HCL 10 MG PO TABS: 10.0000 mg | ORAL_TABLET | Freq: Two times a day (BID) | ORAL | 0 refills | Status: DC

## 2017-01-12 MED ORDER — OXYCODONE HCL 15 MG PO TABS: 15.0000 mg | ORAL_TABLET | ORAL | 0 refills | Status: DC | PRN

## 2017-01-12 NOTE — Telephone Encounter (Signed)
Patient advised on her voicemail-Sylus Stgermain Ander PurpuraV Keisy Strickler, RMA

## 2017-01-12 NOTE — Telephone Encounter (Signed)
Ok to rf? 

## 2017-01-12 NOTE — Telephone Encounter (Signed)
Please review-Caedmon Louque V Aronda Burford, RMA  

## 2017-01-12 NOTE — Telephone Encounter (Signed)
RXs placed up front for pick up, patient advised on her voicemail-Anastasiya V Hopkins, RMA

## 2017-01-12 NOTE — Telephone Encounter (Signed)
Pt is asking if this will be ready to pick up today.  BJ#[phone]/YQCB#[phone]/MW

## 2017-01-12 NOTE — Telephone Encounter (Signed)
May need to be seen to determine options.

## 2017-01-25 DIAGNOSIS — G8929 Other chronic pain: Secondary | ICD-10-CM | POA: Diagnosis not present

## 2017-01-25 DIAGNOSIS — R102 Pelvic and perineal pain: Secondary | ICD-10-CM | POA: Diagnosis not present

## 2017-01-25 DIAGNOSIS — N952 Postmenopausal atrophic vaginitis: Secondary | ICD-10-CM | POA: Diagnosis not present

## 2017-01-26 DIAGNOSIS — M62838 Other muscle spasm: Secondary | ICD-10-CM | POA: Diagnosis not present

## 2017-02-02 ENCOUNTER — Other Ambulatory Visit: Payer: Self-pay | Admitting: Family Medicine

## 2017-02-02 DIAGNOSIS — F329 Major depressive disorder, single episode, unspecified: Secondary | ICD-10-CM

## 2017-02-02 DIAGNOSIS — F419 Anxiety disorder, unspecified: Secondary | ICD-10-CM

## 2017-02-02 NOTE — Telephone Encounter (Signed)
[facility] faxed a refill request for a 90-days supply for the following medication. Thanks CC ° °PARoxetine (PAXIL) 10 MG tablet  ° °

## 2017-02-07 MED ORDER — PAROXETINE HCL 10 MG PO TABS: 10.0000 mg | ORAL_TABLET | Freq: Every day | ORAL | 3 refills | Status: DC

## 2017-02-08 ENCOUNTER — Other Ambulatory Visit: Payer: Self-pay | Admitting: Family Medicine

## 2017-02-08 DIAGNOSIS — F32A Depression, unspecified: Secondary | ICD-10-CM

## 2017-02-08 DIAGNOSIS — F329 Major depressive disorder, single episode, unspecified: Secondary | ICD-10-CM

## 2017-02-09 ENCOUNTER — Other Ambulatory Visit: Payer: Self-pay | Admitting: Family Medicine

## 2017-02-09 DIAGNOSIS — F5105 Insomnia due to other mental disorder: Secondary | ICD-10-CM | POA: Diagnosis not present

## 2017-02-09 DIAGNOSIS — F411 Generalized anxiety disorder: Secondary | ICD-10-CM | POA: Diagnosis not present

## 2017-02-09 DIAGNOSIS — R69 Illness, unspecified: Secondary | ICD-10-CM | POA: Diagnosis not present

## 2017-02-09 DIAGNOSIS — F4312 Post-traumatic stress disorder, chronic: Secondary | ICD-10-CM | POA: Diagnosis not present

## 2017-02-09 NOTE — Telephone Encounter (Signed)
[facility] faxed a refill request for the following medication. Thanks CC  zolpidem (AMBIEN) 10 MG tablet

## 2017-02-12 MED ORDER — ZOLPIDEM TARTRATE 10 MG PO TABS: 10.0000 mg | ORAL_TABLET | Freq: Every day | ORAL | 0 refills | Status: DC

## 2017-02-14 DIAGNOSIS — G8929 Other chronic pain: Secondary | ICD-10-CM | POA: Diagnosis not present

## 2017-02-14 DIAGNOSIS — R198 Other specified symptoms and signs involving the digestive system and abdomen: Secondary | ICD-10-CM | POA: Diagnosis not present

## 2017-02-14 DIAGNOSIS — R69 Illness, unspecified: Secondary | ICD-10-CM | POA: Diagnosis not present

## 2017-02-14 DIAGNOSIS — R109 Unspecified abdominal pain: Secondary | ICD-10-CM | POA: Diagnosis not present

## 2017-02-14 DIAGNOSIS — R102 Pelvic and perineal pain: Secondary | ICD-10-CM | POA: Diagnosis not present

## 2017-03-01 ENCOUNTER — Telehealth: Payer: Self-pay | Admitting: Family Medicine

## 2017-03-03 DIAGNOSIS — F5105 Insomnia due to other mental disorder: Secondary | ICD-10-CM | POA: Diagnosis not present

## 2017-03-03 DIAGNOSIS — R69 Illness, unspecified: Secondary | ICD-10-CM | POA: Diagnosis not present

## 2017-03-03 DIAGNOSIS — F411 Generalized anxiety disorder: Secondary | ICD-10-CM | POA: Diagnosis not present

## 2017-03-03 DIAGNOSIS — F4312 Post-traumatic stress disorder, chronic: Secondary | ICD-10-CM | POA: Diagnosis not present

## 2017-03-14 ENCOUNTER — Encounter: Payer: Self-pay | Admitting: Family Medicine

## 2017-03-14 ENCOUNTER — Ambulatory Visit (INDEPENDENT_AMBULATORY_CARE_PROVIDER_SITE_OTHER): Payer: Medicare HMO | Admitting: Family Medicine

## 2017-03-14 VITALS — BP 110/76 | HR 91 | Temp 98.5°F | Resp 16 | Wt 178.0 lb

## 2017-03-14 DIAGNOSIS — R69 Illness, unspecified: Secondary | ICD-10-CM | POA: Diagnosis not present

## 2017-03-14 DIAGNOSIS — R102 Pelvic and perineal pain: Secondary | ICD-10-CM

## 2017-03-14 DIAGNOSIS — J0110 Acute frontal sinusitis, unspecified: Secondary | ICD-10-CM | POA: Diagnosis not present

## 2017-03-14 DIAGNOSIS — G8929 Other chronic pain: Secondary | ICD-10-CM

## 2017-03-14 DIAGNOSIS — F329 Major depressive disorder, single episode, unspecified: Secondary | ICD-10-CM

## 2017-03-14 MED ORDER — DOXYCYCLINE HYCLATE 100 MG PO TABS: 100.0000 mg | ORAL_TABLET | Freq: Two times a day (BID) | ORAL | 0 refills | Status: DC

## 2017-03-14 NOTE — Progress Notes (Addendum)
Patient: Hannah Vance Female    DOB: [birth year]   62 y.o.   MRN: [id number] Visit Date: 03/14/2017  Today's Provider: [name], MD   Chief Complaint  Patient presents with  . Depression  . Pelvic Pain   Subjective:    HPI   Pt is c/o possible sinusitis x 2 weeks and is worsening. She states her eyes are swollen upon awakening, and has frontal sinus pain. She states her rhinorrhea mucus is green with red mixed in. She states she noticed a clogged ear sensation. She also notes cough, chest "burning".  She is FU on depression. She was referred to Dr. Maryruth Bun. Dr. Maryruth Bun D/C pt's Paxil and Ambien, decreased her Clonazepam dose to 0.5 mg, and started her on Trazodone and Citalopram. Pt states she is not sleeping well, and is going through "withdrawals" and other side effects.  Pt is currently seeing GYN for chronic pelvic pain. They are administering Levator ani injections. Pt states this caused bowel incontinence, but is otherwise improving sx.   Allergies  Allergen Reactions  . Amoxicillin Other (See Comments)  . Morphine Other (See Comments)    Trouble walking, heavy legs  . Septra  [Sulfamethoxazole-Trimethoprim]   . Sulfa Antibiotics Other (See Comments)    burning  . Methadone Nausea And Vomiting     Current Outpatient Medications:  .  aspirin 81 MG EC tablet, Take by mouth., Disp: , Rfl:  .  citalopram (CELEXA) 20 MG tablet, Take 20 mg by mouth daily., Disp: , Rfl: 0 .  clonazePAM (KLONOPIN) 0.5 MG tablet, Take 0.5 mg by mouth at bedtime as needed., Disp: , Rfl: 0 .  cyclobenzaprine (FLEXERIL) 5 MG tablet, Take 5 mg by mouth., Disp: , Rfl:  .  fluticasone (FLONASE) 50 MCG/ACT nasal spray, PLACE 2 SPRAYS INTO BOTH NOSTRILS DAILY., Disp: 16 g, Rfl: 12 .  levothyroxine (SYNTHROID, LEVOTHROID) 25 MCG tablet, TAKE 1 TABLET BY MOUTH DAILY, Disp: 90 tablet, Rfl: 3 .  methylphenidate (RITALIN) 10 MG tablet, Take 1 tablet (10 mg total) by mouth 2 (two) times daily.  07/19/16, Disp: 60 tablet, Rfl: 0 .  oxyCODONE (ROXICODONE) 15 MG immediate release tablet, Take 1 tablet (15 mg total) by mouth every 4 (four) hours as needed for pain. 07/19/16, Disp: 180 tablet, Rfl: 0 .  SUMAtriptan (IMITREX) 50 MG tablet, TAKE 1 TABLET (50 MG TOTAL) BY MOUTH ONCE. MAY REPEAT IN 2 HOURS IF HEADACHE PERSISTS OR RECURS., Disp: 9 tablet, Rfl: 1 .  traZODone (DESYREL) 100 MG tablet, START WITH 1 TABLET AND CAN INCREASE TO 2 TABLETS BY MOUTH NIGHTLY, Disp: , Rfl: 0 .  vortioxetine HBr (TRINTELLIX) 20 MG TABS, Take 20 mg by mouth daily., Disp: 30 tablet, Rfl: 12 .  Biotin 40981 MCG TABS, Take by mouth., Disp: , Rfl:  .  doxycycline (VIBRA-TABS) 100 MG tablet, Take 1 tablet (100 mg total) by mouth 2 (two) times daily., Disp: 20 tablet, Rfl: 0 .  OVER THE COUNTER MEDICATION, , Disp: , Rfl:   Review of Systems  Constitutional: Positive for chills and fatigue. Negative for activity change, appetite change, diaphoresis, fever and unexpected weight change.  HENT: Positive for congestion, rhinorrhea, sinus pressure, sinus pain, sneezing and voice change. Negative for ear pain, postnasal drip, sore throat and tinnitus.   Respiratory: Positive for cough and chest tightness. Negative for wheezing.   Cardiovascular: Negative for chest pain, palpitations and leg swelling.  Genitourinary: Positive for pelvic pain.  Musculoskeletal: Positive for back pain.  Neurological: Positive for headaches.    Social History   Tobacco Use  . Smoking status: Never Smoker  . Smokeless tobacco: Never Used  Substance Use Topics  . Alcohol use: No    Alcohol/week: 0.0 oz   Objective:   BP 110/76 (BP Location: Left Arm, Patient Position: Sitting, Cuff Size: Large)   Pulse 91   Temp 98.5 F (36.9 C) (Oral)   Resp 16   Wt 178 lb (80.7 kg)   SpO2 96%   BMI 28.73 kg/m  Vitals:   03/14/17 1556  BP: 110/76  Pulse: 91  Resp: 16  Temp: 98.5 F (36.9 C)  TempSrc: Oral  SpO2: 96%  Weight: 178 lb  (80.7 kg)     Physical Exam  Constitutional: She appears well-developed and well-nourished.  HENT:  Head: Normocephalic.  Right Ear: Tympanic membrane and external ear normal.  Left Ear: Tympanic membrane and external ear normal.  Nose: Right sinus exhibits no maxillary sinus tenderness and no frontal sinus tenderness. Left sinus exhibits maxillary sinus tenderness and frontal sinus tenderness.  Mouth/Throat: Oropharynx is clear and moist. No oropharyngeal exudate.  Eyes: Conjunctivae are normal.  Neck: Normal range of motion. Neck supple.  Cardiovascular: Normal rate, regular rhythm and normal heart sounds.  Pulmonary/Chest: Effort normal and breath sounds normal. No respiratory distress.  Lymphadenopathy:    She has no cervical adenopathy.  Psychiatric: She has a normal mood and affect. Her behavior is normal.        Assessment & Plan:     1. Acute non-recurrent frontal sinusitis New problem. Treat with Doxy. RTC if sx do not improve or worsen. - doxycycline (VIBRA-TABS) 100 MG tablet; Take 1 tablet (100 mg total) by mouth 2 (two) times daily.  Dispense: 20 tablet; Refill: 0  2. Current episode of major depressive disorder without prior episode, unspecified depression episode severity Continue to follow up with psych. Encouraged pt to continue medications that Dr. Maryruth BunKapur has prescribed.   3. Chronic pelvic pain in female Continue therapy with GYN.    Patient seen and examined by Julieanne Mansonichard Gilbert, MD, and note scribed by Joslyn HyEmily Ratchford, CMA. I have done the exam and reviewed the above chart and it is accurate to the best of my knowledge. DentistDragon  technology has been used in this note in any air is in the dictation or transcription are unintentional.  Megan Mansichard Gilbert Jr, MD  [facility]

## 2017-04-05 DIAGNOSIS — R69 Illness, unspecified: Secondary | ICD-10-CM | POA: Diagnosis not present

## 2017-04-06 DIAGNOSIS — F5105 Insomnia due to other mental disorder: Secondary | ICD-10-CM | POA: Diagnosis not present

## 2017-04-06 DIAGNOSIS — F411 Generalized anxiety disorder: Secondary | ICD-10-CM | POA: Diagnosis not present

## 2017-04-06 DIAGNOSIS — F4312 Post-traumatic stress disorder, chronic: Secondary | ICD-10-CM | POA: Diagnosis not present

## 2017-04-06 DIAGNOSIS — R69 Illness, unspecified: Secondary | ICD-10-CM | POA: Diagnosis not present

## 2017-04-27 DIAGNOSIS — M62838 Other muscle spasm: Secondary | ICD-10-CM | POA: Diagnosis not present

## 2017-05-02 ENCOUNTER — Ambulatory Visit (INDEPENDENT_AMBULATORY_CARE_PROVIDER_SITE_OTHER): Payer: Medicare HMO | Admitting: Family Medicine

## 2017-05-02 VITALS — BP 138/90 | HR 106 | Temp 99.0°F | Resp 18 | Wt 171.0 lb

## 2017-05-02 DIAGNOSIS — G8929 Other chronic pain: Secondary | ICD-10-CM | POA: Diagnosis not present

## 2017-05-02 DIAGNOSIS — R102 Pelvic and perineal pain: Secondary | ICD-10-CM

## 2017-05-02 DIAGNOSIS — G894 Chronic pain syndrome: Secondary | ICD-10-CM | POA: Diagnosis not present

## 2017-05-02 DIAGNOSIS — F329 Major depressive disorder, single episode, unspecified: Secondary | ICD-10-CM

## 2017-05-02 DIAGNOSIS — R69 Illness, unspecified: Secondary | ICD-10-CM | POA: Diagnosis not present

## 2017-05-02 NOTE — Progress Notes (Signed)
Hannah Vance  MRN: [id number] DOB: 11/20/1955  Subjective:  HPI   The patient states that she can not communicate with Dr. Maryruth Bun.  She states that the medicine she was given by her gave her migraines so she has stopped them.  This included the Citalopram and Trazodone.   The patient states she talked with her pharmacist and he said the Citalopram could be causing her to have migraines and drowsiness. She stopped the Trazodone because it was causing her to have headaches and making the vessels in her head pound.  She is on Clonazepam and she states that Dr Maryruth Bun will only allow her to take it for sleep and will not give her more for her to take for anxiety.   The patient also states that she only takes 1/2 of her pain pill in the morning and if she is still hurting in the afternoon she will take the other half.  She first said she does not take any more than that because she is afraid Dr Maryruth Bun will take them away.  Then she said she can't take any after 4 because it will keep her awake at night.  Patient Active Problem List   Diagnosis Date Noted  . Neuropathy 07/06/2016  . Fatigue 07/06/2016  . Pelvic floor dysfunction 03/24/2016  . Chronic pain syndrome 10/08/2015  . Chronic pelvic pain in female 07/01/2014  . Absolute anemia 06/23/2014  . Anxiety 06/23/2014  . Pain in joint 06/23/2014  . 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 Clinical depression 06/23/2014  . Hypercholesteremia 06/23/2014  . Adult hypothyroidism 06/23/2014  . Cannot sleep 06/23/2014  . Billowing mitral valve 06/23/2014  . Anancastic neurosis 06/23/2014  . Avitaminosis D 06/23/2014    No past medical history on file.  Social History   Socioeconomic History  . Marital status: Divorced    Spouse name: Not on file  . Number of children: Not on file  . Years of education: Not on file  . Highest education level: Not on file  Occupational History  . Not  on file  Social Needs  . Financial resource strain: Not on file  . Food insecurity:    Worry: Not on file    Inability: Not on file  . Transportation needs:    Medical: Not on file    Non-medical: Not on file  Tobacco Use  . Smoking status: Never Smoker  . Smokeless tobacco: Never Used  Substance and Sexual Activity  . Alcohol use: No    Alcohol/week: 0.0 oz  . Drug use: No  . Sexual activity: Never  Lifestyle  . Physical activity:    Days per week: Not on file    Minutes per session: Not on file  . Stress: Not on file  Relationships  . Social connections:    Talks on phone: Not on file    Gets together: Not on file    Attends religious service: Not on file    Active member of club or organization: Not on file    Attends meetings of clubs or organizations: Not on file    Relationship status: Not on file  . Intimate partner violence:    Fear of current or ex partner: Not on file    Emotionally abused: Not on file    Physically abused: Not on file    Forced sexual activity: Not on file  Other Topics Concern  . Not on file  Social History  Narrative  . Not on file    Outpatient Encounter Medications as of 05/02/2017  Medication Sig Note  . aspirin 81 MG EC tablet Take by mouth. 06/23/2014: Received from: [facility]  . clonazePAM (KLONOPIN) 0.5 MG tablet Take 0.5 mg by mouth at bedtime as needed.   . cyclobenzaprine (FLEXERIL) 5 MG tablet Take 5 mg by mouth. 11/10/2015: [facility] Received Sig: Take 1 tablet (5 mg total) by mouth Three (3) times a day.  . fluticasone (FLONASE) 50 MCG/ACT nasal spray PLACE 2 SPRAYS INTO BOTH NOSTRILS DAILY.   Marland Kitchen. levothyroxine (SYNTHROID, LEVOTHROID) 25 MCG tablet TAKE 1 TABLET BY MOUTH DAILY   . methylphenidate (RITALIN) 10 MG tablet Take 1 tablet (10 mg total) by mouth 2 (two) times daily. 07/19/16   . oxyCODONE (ROXICODONE) 15 MG immediate release tablet Take 1 tablet (15 mg total) by mouth every 4  (four) hours as needed for pain. 07/19/16   . SUMAtriptan (IMITREX) 50 MG tablet TAKE 1 TABLET (50 MG TOTAL) BY MOUTH ONCE. MAY REPEAT IN 2 HOURS IF HEADACHE PERSISTS OR RECURS.   Marland Kitchen. vortioxetine HBr (TRINTELLIX) 20 MG TABS Take 20 mg by mouth daily.   . Biotin 1308610000 MCG TABS Take by mouth.   . citalopram (CELEXA) 20 MG tablet Take 20 mg by mouth daily. 05/02/2017: This was started by Dr Maryruth BunKapur.  Patient discontinued this because she was told by the pharmacist it could be causing her migraines.  . traZODone (DESYREL) 100 MG tablet START WITH 1 TABLET AND CAN INCREASE TO 2 TABLETS BY MOUTH NIGHTLY 05/02/2017: This was started by Dr. Maryruth BunKapur.  The patient has discontinued this on her own because it did not help her sleep and she said it was causing her to have headaches.  . [DISCONTINUED] doxycycline (VIBRA-TABS) 100 MG tablet Take 1 tablet (100 mg total) by mouth 2 (two) times daily.   . [DISCONTINUED] OVER THE COUNTER MEDICATION     No facility-administered encounter medications on file as of 05/02/2017.     Allergies  Allergen Reactions  . Amoxicillin Other (See Comments)  . Morphine Other (See Comments)    Trouble walking, heavy legs  . Septra  [Sulfamethoxazole-Trimethoprim]   . Sulfa Antibiotics Other (See Comments)    burning  . Methadone Nausea And Vomiting    Review of Systems  Constitutional: Positive for malaise/fatigue. Negative for fever.  Respiratory: Negative for cough, shortness of breath and wheezing.   Cardiovascular: Positive for palpitations (sometimes when she gets anxious). Negative for chest pain, claudication and leg swelling.  Neurological: Negative for dizziness and headaches.  Psychiatric/Behavioral: Positive for depression. Negative for hallucinations, memory loss, substance abuse and suicidal ideas. The patient is nervous/anxious and has insomnia.     Objective:  BP 138/90 (BP Location: Right Arm, Patient Position: Sitting, Cuff Size: Normal)   Pulse (!) 106    Temp 99 F (37.2 C) (Oral)   Resp 18   Wt 171 lb (77.6 kg)   BMI 27.60 kg/m   Physical Exam  Constitutional: She is oriented to person, place, and time and well-developed, well-nourished, and in no distress.  HENT:  Head: Normocephalic and atraumatic.  Eyes: Conjunctivae are normal.  Neck: No thyromegaly present.  Pulmonary/Chest: Effort normal and breath sounds normal.  Abdominal: Soft.  Neurological: She is alert and oriented to person, place, and time. Gait normal. GCS score is 15.  Skin: Skin is warm and dry.  Psychiatric: Mood, memory and affect normal.  Assessment and Plan :  1. Current episode of major depressive disorder without prior episode, unspecified depression episode severity Pt needs counseling . Agree that she needs to take less sedating meds. - Ambulatory referral to Psychology 2.Chronic Anxiety 3.PTSD Needs counseling. 4.Chronic Pain syndrome  I have done the exam and reviewed the chart and it is accurate to the best of my knowledge. Dentist has been used and  any errors in dictation or transcription are unintentional. [name] M.D. [facility]

## 2017-05-05 DIAGNOSIS — F4312 Post-traumatic stress disorder, chronic: Secondary | ICD-10-CM | POA: Diagnosis not present

## 2017-05-05 DIAGNOSIS — F411 Generalized anxiety disorder: Secondary | ICD-10-CM | POA: Diagnosis not present

## 2017-05-05 DIAGNOSIS — R69 Illness, unspecified: Secondary | ICD-10-CM | POA: Diagnosis not present

## 2017-05-05 DIAGNOSIS — F5105 Insomnia due to other mental disorder: Secondary | ICD-10-CM | POA: Diagnosis not present

## 2017-05-11 ENCOUNTER — Encounter: Payer: Self-pay | Admitting: Family Medicine

## 2017-05-11 ENCOUNTER — Ambulatory Visit
Admission: RE | Admit: 2017-05-11 | Discharge: 2017-05-11 | Disposition: A | Discharge status: Home / Self Care | Payer: Medicare HMO | Source: Ambulatory Visit | Attending: Family Medicine | Admitting: Family Medicine

## 2017-05-11 ENCOUNTER — Ambulatory Visit (INDEPENDENT_AMBULATORY_CARE_PROVIDER_SITE_OTHER): Payer: Medicare HMO | Admitting: Family Medicine

## 2017-05-11 VITALS — BP 100/62 | HR 68 | Temp 98.5°F | Resp 18 | Ht 66.0 in | Wt 171.0 lb

## 2017-05-11 DIAGNOSIS — F3289 Other specified depressive episodes: Secondary | ICD-10-CM

## 2017-05-11 DIAGNOSIS — G8929 Other chronic pain: Secondary | ICD-10-CM

## 2017-05-11 DIAGNOSIS — R0602 Shortness of breath: Secondary | ICD-10-CM | POA: Insufficient documentation

## 2017-05-11 DIAGNOSIS — R0789 Other chest pain: Secondary | ICD-10-CM | POA: Diagnosis not present

## 2017-05-11 DIAGNOSIS — E039 Hypothyroidism, unspecified: Secondary | ICD-10-CM | POA: Diagnosis not present

## 2017-05-11 DIAGNOSIS — R5383 Other fatigue: Secondary | ICD-10-CM | POA: Diagnosis not present

## 2017-05-11 DIAGNOSIS — M62838 Other muscle spasm: Secondary | ICD-10-CM | POA: Diagnosis not present

## 2017-05-11 DIAGNOSIS — E7800 Pure hypercholesterolemia, unspecified: Secondary | ICD-10-CM | POA: Diagnosis not present

## 2017-05-11 DIAGNOSIS — R69 Illness, unspecified: Secondary | ICD-10-CM | POA: Diagnosis not present

## 2017-05-11 NOTE — Progress Notes (Signed)
Patient: Hannah Vance Female    DOB: Feb 29, 1955   62 y.o.   MRN: [id number] Visit Date: 05/11/2017  Today's Provider: Megan Mansichard Gilbert Jr, MD   Chief Complaint  Patient presents with  . Shortness of Breath  . Dizziness   Subjective:    HPI Pt is here today for shortness of breath, dizziness, weakness and fatigue. She reports that she had an episode Saturday where she was really short of breath, felt like she was going to pass out, had chest tightness, felt weak and could not finish a sentence because she was so short winded. She reports that she has been extremely tired and weak ever since. She is thinking maybe Dr. Sullivan LoneGilbert would want to do some lab work on her. She reports that she has not had any more chest tightness since this episode but still having a hard time breathing along with the fatigue and weakness.     Allergies  Allergen Reactions  . Amoxicillin Other (See Comments)  . Morphine Other (See Comments)    Trouble walking, heavy legs  . Septra  [Sulfamethoxazole-Trimethoprim]   . Sulfa Antibiotics Other (See Comments)    burning  . Methadone Nausea And Vomiting     Current Outpatient Medications:  .  aspirin 81 MG EC tablet, Take by mouth., Disp: , Rfl:  .  Biotin [id number] MCG TABS, Take by mouth., Disp: , Rfl:  .  citalopram (CELEXA) 20 MG tablet, Take 20 mg by mouth daily., Disp: , Rfl: 0 .  clonazePAM (KLONOPIN) 0.5 MG tablet, Take 0.5 mg by mouth at bedtime as needed., Disp: , Rfl: 0 .  cyclobenzaprine (FLEXERIL) 5 MG tablet, Take 5 mg by mouth., Disp: , Rfl:  .  fluticasone (FLONASE) 50 MCG/ACT nasal spray, PLACE 2 SPRAYS INTO BOTH NOSTRILS DAILY., Disp: 16 g, Rfl: 12 .  levothyroxine (SYNTHROID, LEVOTHROID) 25 MCG tablet, TAKE 1 TABLET BY MOUTH DAILY, Disp: 90 tablet, Rfl: 3 .  methylphenidate (RITALIN) 10 MG tablet, Take 1 tablet (10 mg total) by mouth 2 (two) times daily. 07/19/16, Disp: 60 tablet, Rfl: 0 .  oxyCODONE (ROXICODONE) 15 MG immediate release  tablet, Take 1 tablet (15 mg total) by mouth every 4 (four) hours as needed for pain. 07/19/16, Disp: 180 tablet, Rfl: 0 .  traZODone (DESYREL) 100 MG tablet, START WITH 1 TABLET AND CAN INCREASE TO 2 TABLETS BY MOUTH NIGHTLY, Disp: , Rfl: 0 .  vortioxetine HBr (TRINTELLIX) 20 MG TABS, Take 20 mg by mouth daily., Disp: 30 tablet, Rfl: 12 .  SUMAtriptan (IMITREX) 50 MG tablet, TAKE 1 TABLET (50 MG TOTAL) BY MOUTH ONCE. MAY REPEAT IN 2 HOURS IF HEADACHE PERSISTS OR RECURS., Disp: 9 tablet, Rfl: 1  Review of Systems  Constitutional: Positive for fatigue.  HENT: Negative.   Eyes: Negative.   Respiratory: Positive for chest tightness and shortness of breath.   Cardiovascular: Negative.   Gastrointestinal: Negative.   Endocrine: Negative.   Genitourinary: Negative.   Musculoskeletal: Negative.   Skin: Negative.   Allergic/Immunologic: Negative.   Neurological: Positive for dizziness, weakness, light-headedness and headaches.  Hematological: Negative.   Psychiatric/Behavioral: Negative.     Social History   Tobacco Use  . Smoking status: Never Smoker  . Smokeless tobacco: Never Used  Substance Use Topics  . Alcohol use: No    Alcohol/week: 0.0 oz   Objective:   BP 100/62 (BP Location: Left Arm, Patient Position: Sitting, Cuff Size: Large)   Pulse 68  Temp 98.5 F (36.9 C) (Oral)   Resp 18   Ht 5\' 6"  (1.676 m)   Wt 171 lb (77.6 kg)   SpO2 97%   BMI 27.60 kg/m  Vitals:   05/11/17 1400  BP: 100/62  Pulse: 68  Resp: 18  Temp: 98.5 F (36.9 C)  TempSrc: Oral  SpO2: 97%  Weight: 171 lb (77.6 kg)  Height: 5\' 6"  (1.676 m)     Physical Exam  Constitutional: She is oriented to person, place, and time. She appears well-developed and well-nourished.  HENT:  Head: Normocephalic and atraumatic.  Eyes: Conjunctivae are normal.  Neck: No thyromegaly present.  Cardiovascular: Normal rate, regular rhythm and normal heart sounds.  Pulmonary/Chest: Effort normal and breath sounds  normal.  Abdominal: Soft.  Musculoskeletal: She exhibits no edema.  Neurological: She is alert and oriented to person, place, and time.  Skin: Skin is warm and dry.  Psychiatric: She has a normal mood and affect.        Assessment & Plan:     1. Chest tightness  - EKG 12-Lead - Pro b natriuretic peptide (BNP) - Troponin I  2. Shortness of breath  - EKG 12-Lead - DG Chest 2 View; Future  3. Adult hypothyroidism   4. Other chronic pain   5. Other depression/GAD/PTSD I feel this is major issue for pt.  6. Fatigue, unspecified type  - CBC with Differential/Platelet - TSH  7. Hypercholesteremia  - Comprehensive metabolic panel      I have done the exam and reviewed the above chart and it is accurate to the best of my knowledge. Dentist has been used in this note in any air is in the dictation or transcription are unintentional.  [name], MD  [facility]

## 2017-05-11 NOTE — Telephone Encounter (Signed)
closed

## 2017-05-12 LAB — CBC WITH DIFFERENTIAL/PLATELET
BASOS: 1 %
Basophils Absolute: 0 10*3/uL (ref 0.0–0.2)
EOS (ABSOLUTE): 0.2 10*3/uL (ref 0.0–0.4)
EOS: 2 %
HEMATOCRIT: 39.4 % (ref 34.0–46.6)
HEMOGLOBIN: 13.4 g/dL (ref 11.1–15.9)
Immature Grans (Abs): 0 10*3/uL (ref 0.0–0.1)
Immature Granulocytes: 0 %
LYMPHS ABS: 2.4 10*3/uL (ref 0.7–3.1)
Lymphs: 29 %
MCH: 30.5 pg (ref 26.6–33.0)
MCHC: 34 g/dL (ref 31.5–35.7)
MCV: 90 fL (ref 79–97)
Monocytes Absolute: 0.5 10*3/uL (ref 0.1–0.9)
Monocytes: 6 %
NEUTROS ABS: 5.2 10*3/uL (ref 1.4–7.0)
Neutrophils: 62 %
Platelets: 232 10*3/uL (ref 150–379)
RBC: 4.39 x10E6/uL (ref 3.77–5.28)
RDW: 13.7 % (ref 12.3–15.4)
WBC: 8.3 10*3/uL (ref 3.4–10.8)

## 2017-05-12 LAB — COMPREHENSIVE METABOLIC PANEL
A/G RATIO: 1.9 (ref 1.2–2.2)
ALBUMIN: 4.2 g/dL (ref 3.6–4.8)
ALK PHOS: 74 IU/L (ref 39–117)
ALT: 20 IU/L (ref 0–32)
AST: 26 IU/L (ref 0–40)
BILIRUBIN TOTAL: 0.5 mg/dL (ref 0.0–1.2)
BUN / CREAT RATIO: 14 (ref 12–28)
BUN: 13 mg/dL (ref 8–27)
CO2: 23 mmol/L (ref 20–29)
Calcium: 9.3 mg/dL (ref 8.7–10.3)
Chloride: 104 mmol/L (ref 96–106)
Creatinine, Ser: 0.96 mg/dL (ref 0.57–1.00)
GFR calc non Af Amer: 64 mL/min/{1.73_m2} (ref >59)
GFR, EST AFRICAN AMERICAN: 74 mL/min/{1.73_m2} (ref >59)
GLOBULIN, TOTAL: 2.2 g/dL (ref 1.5–4.5)
Glucose: 81 mg/dL (ref 65–99)
Potassium: 4.2 mmol/L (ref 3.5–5.2)
SODIUM: 143 mmol/L (ref 134–144)
TOTAL PROTEIN: 6.4 g/dL (ref 6.0–8.5)

## 2017-05-12 LAB — TROPONIN I

## 2017-05-12 LAB — TSH: TSH: 1.21 u[IU]/mL (ref 0.450–4.500)

## 2017-05-12 LAB — PRO B NATRIURETIC PEPTIDE: NT-PRO BNP: 59 pg/mL (ref 0–287)

## 2017-05-15 ENCOUNTER — Telehealth: Payer: Self-pay | Admitting: Emergency Medicine

## 2017-05-15 NOTE — Telephone Encounter (Signed)
-----   Message from Maple Hudsonichard L Gilbert Jr., MD sent at 05/12/2017  8:16 PM EDT ----- Labs normal.

## 2017-05-15 NOTE — Telephone Encounter (Signed)
-----   Message from Maple Hudsonichard L Gilbert Jr., MD sent at 05/15/2017 11:25 AM EDT ----- CXR ok.

## 2017-05-15 NOTE — Telephone Encounter (Signed)
LMTCB

## 2017-05-17 ENCOUNTER — Telehealth: Payer: Self-pay | Admitting: Family Medicine

## 2017-05-17 NOTE — Telephone Encounter (Signed)
Faxed OV 12/20/2016 to Dr Caryn SectionAarti Kapur on 02/10/17

## 2017-05-17 NOTE — Telephone Encounter (Signed)
Pt advised. She reports that she thinks the pain is coming from the new medications. She is still having the pain.

## 2017-05-18 NOTE — Telephone Encounter (Signed)
She would need to be reevaluated by someone. I could not find a definite issue last week. If sympoms are worriesome refer to ED.

## 2017-06-05 ENCOUNTER — Telehealth: Payer: Self-pay | Admitting: Family Medicine

## 2017-06-05 NOTE — Telephone Encounter (Signed)
Pt states she would like a referral to Pain Clinic states her PT said since her pain isn't getting better they recommended her go to the pain clinic.

## 2017-06-06 NOTE — Telephone Encounter (Signed)
Please advise. Thanks.  

## 2017-06-07 ENCOUNTER — Ambulatory Visit (INDEPENDENT_AMBULATORY_CARE_PROVIDER_SITE_OTHER): Payer: Medicare HMO | Admitting: Family Medicine

## 2017-06-07 ENCOUNTER — Ambulatory Visit
Admission: RE | Admit: 2017-06-07 | Discharge: 2017-06-07 | Disposition: A | Discharge status: Home / Self Care | Payer: Medicare HMO | Source: Ambulatory Visit | Attending: Family Medicine | Admitting: Family Medicine

## 2017-06-07 ENCOUNTER — Telehealth: Payer: Self-pay | Admitting: Family Medicine

## 2017-06-07 VITALS — BP 110/76 | HR 110 | Temp 98.7°F | Resp 18 | Wt 171.0 lb

## 2017-06-07 DIAGNOSIS — M5440 Lumbago with sciatica, unspecified side: Secondary | ICD-10-CM

## 2017-06-07 DIAGNOSIS — M6289 Other specified disorders of muscle: Secondary | ICD-10-CM

## 2017-06-07 DIAGNOSIS — F329 Major depressive disorder, single episode, unspecified: Secondary | ICD-10-CM

## 2017-06-07 DIAGNOSIS — M5136 Other intervertebral disc degeneration, lumbar region: Secondary | ICD-10-CM | POA: Diagnosis not present

## 2017-06-07 DIAGNOSIS — R69 Illness, unspecified: Secondary | ICD-10-CM | POA: Diagnosis not present

## 2017-06-07 DIAGNOSIS — F419 Anxiety disorder, unspecified: Secondary | ICD-10-CM

## 2017-06-07 DIAGNOSIS — M545 Low back pain: Secondary | ICD-10-CM | POA: Diagnosis not present

## 2017-06-07 NOTE — Progress Notes (Signed)
Hannah Vance  MRN: [id number] DOB: 1955/12/12  Subjective:  HPI   The patient is a 62 year old female who presents for evaluation of back pain.  She would like to get a referral to Dr Welton Flakes at the [facility].  Patient Active Problem List   Diagnosis Date Noted  . Neuropathy 07/06/2016  . Fatigue 07/06/2016  . Pelvic floor dysfunction 03/24/2016  . Chronic pain syndrome 10/08/2015  . Chronic pelvic pain in female 07/01/2014  . Absolute anemia 06/23/2014  . Anxiety 06/23/2014  . Pain in joint 06/23/2014  . 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 Clinical depression 06/23/2014  . Hypercholesteremia 06/23/2014  . Adult hypothyroidism 06/23/2014  . Cannot sleep 06/23/2014  . Billowing mitral valve 06/23/2014  . Anancastic neurosis 06/23/2014  . Avitaminosis D 06/23/2014    No past medical history on file.  Social History   Socioeconomic History  . Marital status: Divorced    Spouse name: Not on file  . Number of children: Not on file  . Years of education: Not on file  . Highest education level: Not on file  Occupational History  . Not on file  Social Needs  . Financial resource strain: Not on file  . Food insecurity:    Worry: Not on file    Inability: Not on file  . Transportation needs:    Medical: Not on file    Non-medical: Not on file  Tobacco Use  . Smoking status: Never Smoker  . Smokeless tobacco: Never Used  Substance and Sexual Activity  . Alcohol use: No    Alcohol/week: 0.0 oz  . Drug use: No  . Sexual activity: Never  Lifestyle  . Physical activity:    Days per week: Not on file    Minutes per session: Not on file  . Stress: Not on file  Relationships  . Social connections:    Talks on phone: Not on file    Gets together: Not on file    Attends religious service: Not on file    Active member of club or organization: Not on file    Attends meetings of clubs or  organizations: Not on file    Relationship status: Not on file  . Intimate partner violence:    Fear of current or ex partner: Not on file    Emotionally abused: Not on file    Physically abused: Not on file    Forced sexual activity: Not on file  Other Topics Concern  . Not on file  Social History Narrative  . Not on file    Outpatient Encounter Medications as of 06/07/2017  Medication Sig Note  . aspirin 81 MG EC tablet Take by mouth. 06/23/2014: Received from: [facility]  . Biotin 40981 MCG TABS Take by mouth.   . citalopram (CELEXA) 20 MG tablet Take 20 mg by mouth daily. 05/02/2017: This was started by Dr Maryruth Bun.  Patient discontinued this because she was told by the pharmacist it could be causing her migraines.  . clonazePAM (KLONOPIN) 0.5 MG tablet Take 0.5 mg by mouth at bedtime as needed.   . cyclobenzaprine (FLEXERIL) 5 MG tablet Take 5 mg by mouth. 11/10/2015: Received from: [facility] Received Sig: Take 1 tablet (5 mg total) by mouth Three (3) times a day.  . fluticasone (FLONASE) 50 MCG/ACT nasal spray PLACE 2 SPRAYS INTO BOTH NOSTRILS DAILY.   Marland Kitchen levothyroxine (SYNTHROID, LEVOTHROID) 25  MCG tablet TAKE 1 TABLET BY MOUTH DAILY   . methylphenidate (RITALIN) 10 MG tablet Take 1 tablet (10 mg total) by mouth 2 (two) times daily. 07/19/16   . oxyCODONE (ROXICODONE) 15 MG immediate release tablet Take 1 tablet (15 mg total) by mouth every 4 (four) hours as needed for pain. 07/19/16   . vortioxetine HBr (TRINTELLIX) 20 MG TABS Take 20 mg by mouth daily.   . SUMAtriptan (IMITREX) 50 MG tablet TAKE 1 TABLET (50 MG TOTAL) BY MOUTH ONCE. MAY REPEAT IN 2 HOURS IF HEADACHE PERSISTS OR RECURS.   . [DISCONTINUED] traZODone (DESYREL) 100 MG tablet START WITH 1 TABLET AND CAN INCREASE TO 2 TABLETS BY MOUTH NIGHTLY 05/02/2017: This was started by Dr. Maryruth Bun.  The patient has discontinued this on her own because it did not help her sleep and she said it was causing her to have  headaches.   No facility-administered encounter medications on file as of 06/07/2017.     Allergies  Allergen Reactions  . Amoxicillin Other (See Comments)  . Morphine Other (See Comments)    Trouble walking, heavy legs  . Septra  [Sulfamethoxazole-Trimethoprim]   . Sulfa Antibiotics Other (See Comments)    burning  . Methadone Nausea And Vomiting    Review of Systems  Constitutional: Positive for malaise/fatigue. Negative for fever.  Eyes: Negative.   Respiratory: Positive for shortness of breath. Negative for cough and wheezing.   Cardiovascular: Positive for leg swelling. Negative for chest pain, palpitations and orthopnea.  Gastrointestinal: Negative.   Musculoskeletal: Positive for back pain.  Endo/Heme/Allergies: Negative.   Psychiatric/Behavioral: The patient is nervous/anxious.     Objective:  BP 110/76 (BP Location: Right Arm, Patient Position: Sitting, Cuff Size: Normal)   Pulse (!) 110   Temp 98.7 F (37.1 C) (Oral)   Resp 18   Wt 171 lb (77.6 kg)   BMI 27.60 kg/m   Physical Exam  Constitutional: She is oriented to person, place, and time and well-developed, well-nourished, and in no distress.  HENT:  Head: Normocephalic and atraumatic.  Eyes: Conjunctivae are normal. No scleral icterus.  Neck: No thyromegaly present.  Cardiovascular: Normal rate, regular rhythm and normal heart sounds.  Pulmonary/Chest: Effort normal and breath sounds normal.  Abdominal: Soft.  Musculoskeletal: She exhibits no edema.  Neurological: She is alert and oriented to person, place, and time. Gait normal. GCS score is 15.  Skin: Skin is warm and dry.  Psychiatric: Mood, memory and affect normal.    Assessment and Plan :  1. Low back pain with sciatica, sciatica laterality unspecified, unspecified back pain laterality, unspecified chronicity This could be radicular but appears to be more degenerative--pt wishes to see pain specialist. - DG Lumbar Spine Complete; Future -  Ambulatory referral to Anesthesiology 2.MDD/GAD Major issues. Pt has not followed through on counseling.   I have done the exam and reviewed the chart and it is accurate to the best of my knowledge. Dentist has been used and  any errors in dictation or transcription are unintentional. [name] M.D. [facility]

## 2017-06-07 NOTE — Telephone Encounter (Signed)
Pt states she called her insurance company to confirm that Dr. Welton Vance was in network and pt states she was told by her insurance company that he was in network. Pt states she would like to use Dr. Welton Vance not unless provider would like for her to go to someone else if she needs this done. Thanks CC

## 2017-06-08 NOTE — Progress Notes (Signed)
Advised 

## 2017-06-08 NOTE — Telephone Encounter (Signed)
I will need to make sure she specified which Dr Welton Flakes, because the computer won't let me put in the order.

## 2017-06-20 DIAGNOSIS — R69 Illness, unspecified: Secondary | ICD-10-CM | POA: Diagnosis not present

## 2017-06-27 DIAGNOSIS — M62838 Other muscle spasm: Secondary | ICD-10-CM | POA: Diagnosis not present

## 2017-07-06 DIAGNOSIS — M62838 Other muscle spasm: Secondary | ICD-10-CM | POA: Diagnosis not present

## 2017-07-11 DIAGNOSIS — R69 Illness, unspecified: Secondary | ICD-10-CM | POA: Diagnosis not present

## 2017-07-25 DIAGNOSIS — M62838 Other muscle spasm: Secondary | ICD-10-CM | POA: Diagnosis not present

## 2017-07-26 ENCOUNTER — Other Ambulatory Visit: Payer: Self-pay | Admitting: Family Medicine

## 2017-07-26 DIAGNOSIS — F329 Major depressive disorder, single episode, unspecified: Secondary | ICD-10-CM

## 2017-07-26 NOTE — Telephone Encounter (Signed)
Ok to refill meds

## 2017-07-26 NOTE — Telephone Encounter (Signed)
Pt contacted office for refill request on the following medications:  1. methylphenidate (RITALIN) 10 MG tablet 2. clonazePAM (KLONOPIN) 0.5 MG tablet 3. citalopram (CELEXA) 20 MG tablet   [facility] [address]  Pt stated that she has discussed with Dr. Sullivan LoneGilbert about no longer seeing Dr. Maryruth BunKapur whom was managing the following medications for pt:  clonazePAM (KLONOPIN) 0.5 MG tablet & citalopram (CELEXA) 20 MG tablet.  Pt is requesting that Dr. Sullivan LoneGilbert start managing those medications and send in new Rx. Please advise. Thanks TNP

## 2017-07-31 MED ORDER — CLONAZEPAM 0.5 MG PO TABS: 0.5000 mg | ORAL_TABLET | Freq: Every evening | ORAL | 3 refills | Status: DC | PRN

## 2017-07-31 MED ORDER — METHYLPHENIDATE HCL 10 MG PO TABS: 10.0000 mg | ORAL_TABLET | Freq: Two times a day (BID) | ORAL | 0 refills | Status: DC

## 2017-07-31 MED ORDER — CITALOPRAM HYDROBROMIDE 20 MG PO TABS: 20.0000 mg | ORAL_TABLET | Freq: Every day | ORAL | 11 refills | Status: DC

## 2017-07-31 NOTE — Telephone Encounter (Signed)
Pt calling stating she is out of medications.  States she requested refills last week.  Wants to know if she needs appt.

## 2017-07-31 NOTE — Telephone Encounter (Signed)
Please review. Thanks!  

## 2017-08-02 MED ORDER — METHYLPHENIDATE HCL 10 MG PO TABS: 10.0000 mg | ORAL_TABLET | Freq: Two times a day (BID) | ORAL | 0 refills | Status: DC

## 2017-08-02 MED ORDER — CLONAZEPAM 0.5 MG PO TABS: 0.5000 mg | ORAL_TABLET | Freq: Every evening | ORAL | 3 refills | Status: DC | PRN

## 2017-08-02 MED ORDER — CITALOPRAM HYDROBROMIDE 20 MG PO TABS: 20.0000 mg | ORAL_TABLET | Freq: Every day | ORAL | 11 refills | Status: DC

## 2017-08-02 NOTE — Telephone Encounter (Signed)
Ok to refill for 1 month--thx

## 2017-08-02 NOTE — Addendum Note (Signed)
Addended by: Janey GreaserESANTO, Dailee Manalang D on: 08/02/2017 08:42 AM   Modules accepted: Orders

## 2017-08-09 DIAGNOSIS — M62838 Other muscle spasm: Secondary | ICD-10-CM | POA: Diagnosis not present

## 2017-08-22 ENCOUNTER — Encounter: Payer: Self-pay | Admitting: Family Medicine

## 2017-08-22 ENCOUNTER — Ambulatory Visit (INDEPENDENT_AMBULATORY_CARE_PROVIDER_SITE_OTHER): Payer: Medicare HMO | Admitting: Family Medicine

## 2017-08-22 VITALS — BP 118/72 | HR 96 | Temp 98.8°F | Resp 16 | Wt 171.0 lb

## 2017-08-22 DIAGNOSIS — F329 Major depressive disorder, single episode, unspecified: Secondary | ICD-10-CM

## 2017-08-22 DIAGNOSIS — R69 Illness, unspecified: Secondary | ICD-10-CM | POA: Diagnosis not present

## 2017-08-22 DIAGNOSIS — J324 Chronic pansinusitis: Secondary | ICD-10-CM

## 2017-08-22 DIAGNOSIS — F419 Anxiety disorder, unspecified: Secondary | ICD-10-CM | POA: Diagnosis not present

## 2017-08-22 MED ORDER — AMOXICILLIN-POT CLAVULANATE 875-125 MG PO TABS: 1.0000 | ORAL_TABLET | Freq: Two times a day (BID) | ORAL | 0 refills | Status: DC

## 2017-08-22 NOTE — Progress Notes (Signed)
Patient: Hannah Vance Female    DOB: Jun 15, 1955   62 y.o.   MRN: [id number] Visit Date: 08/22/2017  Today's Provider: Megan Mansichard Leno Mathes Jr, MD   Chief Complaint  Patient presents with  . Cough   Subjective:    Cough  This is a new problem. The current episode started 1 to 4 weeks ago (3 weeks). The problem has been gradually worsening. Associated symptoms include ear congestion, a fever, headaches, myalgias, nasal congestion, postnasal drip, a sore throat and shortness of breath. She has tried OTC cough suppressant for the symptoms. The treatment provided no relief.       Allergies  Allergen Reactions  . Amoxicillin Other (See Comments)  . Morphine Other (See Comments)    Trouble walking, heavy legs  . Septra  [Sulfamethoxazole-Trimethoprim]   . Sulfa Antibiotics Other (See Comments)    burning  . Methadone Nausea And Vomiting     Current Outpatient Medications:  .  aspirin 81 MG EC tablet, Take by mouth., Disp: , Rfl:  .  Biotin [id number] MCG TABS, Take by mouth., Disp: , Rfl:  .  citalopram (CELEXA) 20 MG tablet, Take 1 tablet (20 mg total) by mouth daily., Disp: 30 tablet, Rfl: 11 .  clonazePAM (KLONOPIN) 0.5 MG tablet, Take 1 tablet (0.5 mg total) by mouth at bedtime as needed., Disp: 30 tablet, Rfl: 3 .  cyclobenzaprine (FLEXERIL) 5 MG tablet, Take 5 mg by mouth., Disp: , Rfl:  .  fluticasone (FLONASE) 50 MCG/ACT nasal spray, PLACE 2 SPRAYS INTO BOTH NOSTRILS DAILY., Disp: 16 g, Rfl: 12 .  levothyroxine (SYNTHROID, LEVOTHROID) 25 MCG tablet, TAKE 1 TABLET BY MOUTH DAILY, Disp: 90 tablet, Rfl: 3 .  methylphenidate (RITALIN) 10 MG tablet, Take 1 tablet (10 mg total) by mouth 2 (two) times daily. 07/19/16, Disp: 60 tablet, Rfl: 0 .  oxyCODONE (ROXICODONE) 15 MG immediate release tablet, Take 1 tablet (15 mg total) by mouth every 4 (four) hours as needed for pain. 07/19/16, Disp: 180 tablet, Rfl: 0 .  vortioxetine HBr (TRINTELLIX) 20 MG TABS, Take 20 mg by mouth daily.,  Disp: 30 tablet, Rfl: 12 .  SUMAtriptan (IMITREX) 50 MG tablet, TAKE 1 TABLET (50 MG TOTAL) BY MOUTH ONCE. MAY REPEAT IN 2 HOURS IF HEADACHE PERSISTS OR RECURS., Disp: 9 tablet, Rfl: 1  Review of Systems  Constitutional: Positive for fever.  HENT: Positive for postnasal drip and sore throat.   Eyes: Negative.   Respiratory: Positive for cough and shortness of breath.   Endocrine: Negative.   Musculoskeletal: Positive for myalgias.  Allergic/Immunologic: Negative.   Neurological: Positive for headaches.  Psychiatric/Behavioral: The patient is nervous/anxious.     Social History   Tobacco Use  . Smoking status: Never Smoker  . Smokeless tobacco: Never Used  Substance Use Topics  . Alcohol use: No    Alcohol/week: 0.0 oz   Objective:   BP 118/72 (BP Location: Left Arm, Patient Position: Sitting, Cuff Size: Normal)   Pulse 96   Temp 98.8 F (37.1 C)   Resp 16   Wt 171 lb (77.6 kg)   SpO2 97%   BMI 27.60 kg/m  Vitals:   08/22/17 1428  BP: 118/72  Pulse: 96  Resp: 16  Temp: 98.8 F (37.1 C)  SpO2: 97%  Weight: 171 lb (77.6 kg)     Physical Exam  Constitutional: She is oriented to person, place, and time. She appears well-developed and well-nourished.  HENT:  Head: Normocephalic  and atraumatic.  Right Ear: External ear normal.  Left Ear: External ear normal.  Nose: Nose normal.  Eyes: Conjunctivae are normal. No scleral icterus.  Neck: No thyromegaly present.  Cardiovascular: Normal rate, regular rhythm and normal heart sounds.  Pulmonary/Chest: Effort normal and breath sounds normal.  Musculoskeletal: She exhibits no edema.  Neurological: She is alert and oriented to person, place, and time.  Skin: Skin is warm and dry.  Psychiatric: She has a normal mood and affect. Her behavior is normal. Judgment and thought content normal.        Assessment & Plan:     1. Pansinusitis, unspecified chronicity  - amoxicillin-clavulanate (AUGMENTIN) 875-125 MG tablet;  Take 1 tablet by mouth 2 (two) times daily.  Dispense: 20 tablet; Refill: 0 2.GAD/MDD Pt needs counseling.     I have done the exam and reviewed the above chart and it is accurate to the best of my knowledge. Dentist has been used in this note in any air is in the dictation or transcription are unintentional.  [name], MD  [facility]

## 2017-08-23 DIAGNOSIS — M62838 Other muscle spasm: Secondary | ICD-10-CM | POA: Diagnosis not present

## 2017-08-23 MED ORDER — VORTIOXETINE HBR 20 MG PO TABS: 20.0000 mg | ORAL_TABLET | Freq: Every day | ORAL | 12 refills | Status: DC

## 2017-08-30 ENCOUNTER — Telehealth: Payer: Self-pay | Admitting: Family Medicine

## 2017-08-30 NOTE — Telephone Encounter (Signed)
Please review. Thanks!  

## 2017-08-30 NOTE — Telephone Encounter (Signed)
Pt stated that she didn't realize that she still had refill of clonazePAM (KLONOPIN) [facility] Dr[facility] Maryruth BunKapur. Pt stated that she didn't know that she had picked up medication that was written by Dr. Maryruth BunKapur until she got home and saw the name on the bottle. Pt stated that she wanted to let Dr. Sullivan LoneGilbert know and that she wasn't trying to get the medication more than what she is supposed to get. Pt stated that [facility] only gets this medication from Dr. Sullivan LoneGilbert. Pt was upset saying that she doesn't want to do anything wrong or have Dr. Sullivan LoneGilbert thinks pt was trying to do anything underhanded. Please advise. Thanks TNP

## 2017-09-04 NOTE — Telephone Encounter (Signed)
Ok thanks 

## 2017-09-05 ENCOUNTER — Other Ambulatory Visit: Payer: Self-pay | Admitting: Family Medicine

## 2017-09-05 NOTE — Telephone Encounter (Signed)
This was faxed to [facility] on 08/25/2017. I called the number listed to see if they would take a verbal, but I have to know which prescription program the patient goes through before they will fill it (per the representative I spoke with).  Tried calling the patient to get more info, and no answer. Will try again later.

## 2017-09-05 NOTE — Telephone Encounter (Signed)
The patient called back with the correct info.  Called [phone] to give a verbal order for trintellix. Patient was also advised that this was done.

## 2017-09-05 NOTE — Telephone Encounter (Signed)
Pt stated that [facility] advised pt they didn't receive Rx for vortioxetine HBr (TRINTELLIX) 20 MG TABS tablet. Pt stated that she is almost out of the medication and is requesting that we call the Rx into [facility] so they can go ahead and send medication to pt before she runs out. Please advise. Thanks TNP

## 2017-09-19 ENCOUNTER — Telehealth: Payer: Self-pay | Admitting: Family Medicine

## 2017-09-19 NOTE — Telephone Encounter (Signed)
Please advise 

## 2017-09-19 NOTE — Telephone Encounter (Signed)
Pt called saying she did get the prescription for Trintellix from [facility] but it was only for 30 days and the prescriber was Dr. Maryruth BunKapur instead of Dr. Sullivan LoneGilbert.  She said the prescription is usually for 90 days and she thought Dr. Sullivan LoneGilbert was the prescriber...  She is confused.  Did Dr. Sullivan LoneGilbert   CB#  [phone]  Thanks Barth Kirksteri

## 2017-09-20 ENCOUNTER — Other Ambulatory Visit: Payer: Self-pay

## 2017-09-20 MED ORDER — VORTIOXETINE HBR 20 MG PO TABS: 20.0000 mg | ORAL_TABLET | Freq: Every day | ORAL | 12 refills | Status: DC

## 2017-09-20 NOTE — Telephone Encounter (Signed)
Ok to do 90 days

## 2017-09-25 NOTE — Telephone Encounter (Signed)
Called in Rx as below.  

## 2017-09-26 ENCOUNTER — Other Ambulatory Visit: Payer: Self-pay | Admitting: Family Medicine

## 2017-09-26 DIAGNOSIS — G8929 Other chronic pain: Secondary | ICD-10-CM

## 2017-09-26 NOTE — Telephone Encounter (Signed)
1. Pt stated that she has requested the Rx for vortioxetine HBr (TRINTELLIX) 20 MG TABS tablet for 90 day supply be sent to [facility] by [employer] but [facility]. Pt stated that she can't afford the medication and needs it sent to [facility]. Pt is requesting a new Rx be sent.  2. Pt contacted office for refill request on the following medications:  oxyCODONE (ROXICODONE) 15 MG immediate release tablet  [facility] [address]  Please advise. Thanks TNP

## 2017-10-02 ENCOUNTER — Telehealth: Payer: Self-pay | Admitting: Family Medicine

## 2017-10-02 ENCOUNTER — Other Ambulatory Visit: Payer: Self-pay

## 2017-10-02 DIAGNOSIS — M62838 Other muscle spasm: Secondary | ICD-10-CM | POA: Diagnosis not present

## 2017-10-02 MED ORDER — OXYCODONE HCL 15 MG PO TABS: 15.0000 mg | ORAL_TABLET | ORAL | 0 refills | Status: DC | PRN

## 2017-10-02 MED ORDER — VORTIOXETINE HBR 20 MG PO TABS: 20.0000 mg | ORAL_TABLET | Freq: Every day | ORAL | 12 refills | Status: DC

## 2017-10-02 NOTE — Telephone Encounter (Signed)
Opened in error

## 2017-10-02 NOTE — Telephone Encounter (Signed)
Both Rx's have been done

## 2017-10-02 NOTE — Telephone Encounter (Signed)
Pt called for a status update on her request from 09/26/17. Please see message below. Please advise. Thanks TNP

## 2017-10-03 ENCOUNTER — Encounter: Payer: Self-pay | Admitting: Family Medicine

## 2017-10-03 ENCOUNTER — Ambulatory Visit (INDEPENDENT_AMBULATORY_CARE_PROVIDER_SITE_OTHER): Payer: Medicare HMO | Admitting: Family Medicine

## 2017-10-03 VITALS — BP 126/88 | HR 89 | Temp 99.5°F | Wt 174.0 lb

## 2017-10-03 DIAGNOSIS — M6289 Other specified disorders of muscle: Secondary | ICD-10-CM

## 2017-10-03 DIAGNOSIS — F329 Major depressive disorder, single episode, unspecified: Secondary | ICD-10-CM

## 2017-10-03 DIAGNOSIS — J3089 Other allergic rhinitis: Secondary | ICD-10-CM | POA: Diagnosis not present

## 2017-10-03 DIAGNOSIS — G8929 Other chronic pain: Secondary | ICD-10-CM

## 2017-10-03 DIAGNOSIS — G894 Chronic pain syndrome: Secondary | ICD-10-CM

## 2017-10-03 DIAGNOSIS — R102 Pelvic and perineal pain: Secondary | ICD-10-CM

## 2017-10-03 DIAGNOSIS — R69 Illness, unspecified: Secondary | ICD-10-CM | POA: Diagnosis not present

## 2017-10-03 MED ORDER — OXYCODONE HCL 10 MG PO TABS: 10.0000 mg | ORAL_TABLET | Freq: Four times a day (QID) | ORAL | 0 refills | Status: DC | PRN

## 2017-10-03 MED ORDER — CYCLOBENZAPRINE HCL 5 MG PO TABS: 5.0000 mg | ORAL_TABLET | Freq: Three times a day (TID) | ORAL | 5 refills | Status: DC | PRN

## 2017-10-03 MED ORDER — FLUTICASONE PROPIONATE 50 MCG/ACT NA SUSP: 2.0000 | Freq: Every day | NASAL | 12 refills | Status: AC

## 2017-10-03 NOTE — Progress Notes (Signed)
Patient: Hannah Vance Female    DOB: [birth year]   62 y.o.   MRN: [id number] Visit Date: 10/03/2017  Today's Provider: [name], MD   Chief Complaint  Patient presents with  . Pain    Chronic issue.  Pt would like to discuss pain management.   Subjective:    HPI Pt comes in today to discuss her pain medications.  Patient states that her physical therapist wants her on regular dose of long-acting narcotics.  She is never found anything to relieve the pain but likes the narcotics and the benzodiazepines and feel like both of these things help her.    Allergies  Allergen Reactions  . Amoxicillin Other (See Comments)  . Morphine Other (See Comments)    Trouble walking, heavy legs  . Septra  [Sulfamethoxazole-Trimethoprim]   . Sulfa Antibiotics Other (See Comments)    burning  . Methadone Nausea And Vomiting     Current Outpatient Medications:  .  aspirin 81 MG EC tablet, Take by mouth., Disp: , Rfl:  .  citalopram (CELEXA) 20 MG tablet, Take 1 tablet (20 mg total) by mouth daily., Disp: 30 tablet, Rfl: 11 .  clonazePAM (KLONOPIN) 0.5 MG tablet, Take 1 tablet (0.5 mg total) by mouth at bedtime as needed., Disp: 30 tablet, Rfl: 3 .  cyclobenzaprine (FLEXERIL) 5 MG tablet, Take 5 mg by mouth., Disp: , Rfl:  .  fluticasone (FLONASE) 50 MCG/ACT nasal spray, PLACE 2 SPRAYS INTO BOTH NOSTRILS DAILY., Disp: 16 g, Rfl: 12 .  levothyroxine (SYNTHROID, LEVOTHROID) 25 MCG tablet, TAKE 1 TABLET BY MOUTH DAILY, Disp: 90 tablet, Rfl: 3 .  methylphenidate (RITALIN) 10 MG tablet, Take 1 tablet (10 mg total) by mouth 2 (two) times daily. 07/19/16, Disp: 60 tablet, Rfl: 0 .  oxyCODONE (ROXICODONE) 15 MG immediate release tablet, Take 1 tablet (15 mg total) by mouth every 4 (four) hours as needed for pain. 07/19/16, Disp: 180 tablet, Rfl: 0 .  vortioxetine HBr (TRINTELLIX) 20 MG TABS tablet, Take 1 tablet (20 mg total) by mouth daily., Disp: 90 tablet, Rfl: 12 .   amoxicillin-clavulanate (AUGMENTIN) 875-125 MG tablet, Take 1 tablet by mouth 2 (two) times daily. (Patient not taking: Reported on 10/03/2017), Disp: 20 tablet, Rfl: 0 .  Biotin 40981 MCG TABS, Take by mouth., Disp: , Rfl:  .  SUMAtriptan (IMITREX) 50 MG tablet, TAKE 1 TABLET (50 MG TOTAL) BY MOUTH ONCE. MAY REPEAT IN 2 HOURS IF HEADACHE PERSISTS OR RECURS., Disp: 9 tablet, Rfl: 1  Review of Systems  Constitutional: Negative.   HENT: Negative.   Respiratory: Negative.   Cardiovascular: Negative.   Endocrine: Negative.   Musculoskeletal: Positive for arthralgias, back pain, gait problem, joint swelling and myalgias. Negative for neck pain and neck stiffness.  Allergic/Immunologic: Negative.   Psychiatric/Behavioral: Positive for agitation.    Social History   Tobacco Use  . Smoking status: Never Smoker  . Smokeless tobacco: Never Used  Substance Use Topics  . Alcohol use: No    Alcohol/week: 0.0 standard drinks   Objective:   BP 126/88 (BP Location: Left Arm, Patient Position: Sitting, Cuff Size: Normal)   Pulse 89   Temp 99.5 F (37.5 C) (Oral)   Wt 174 lb (78.9 kg)   SpO2 96%   BMI 28.08 kg/m  Vitals:   10/03/17 0822  BP: 126/88  Pulse: 89  Temp: 99.5 F (37.5 C)  TempSrc: Oral  SpO2: 96%  Weight: 174 lb (78.9  kg)     Physical Exam  Constitutional: She is oriented to person, place, and time. She appears well-developed and well-nourished.  Pleasant white female wonders about the room during the exam is she states that she is unable to find a comfortable position.  HENT:  Head: Normocephalic and atraumatic.  Eyes: Conjunctivae are normal. No scleral icterus.  Neck: No thyromegaly present.  Cardiovascular: Normal rate, regular rhythm and normal heart sounds.  Pulmonary/Chest: Effort normal and breath sounds normal.  Neurological: She is alert and oriented to person, place, and time.  Skin: Skin is warm and dry.  Psychiatric: She has a normal mood and affect. Her  behavior is normal. Judgment and thought content normal.        Assessment & Plan:     1. Allergic rhinitis  - fluticasone (FLONASE) 50 MCG/ACT nasal spray; Place 2 sprays into both nostrils daily.  Dispense: 16 g; Refill: 12 2.  Chronic abdominal pain, back pain, pain syndrome This is apparently mainly muscular and [facility]necology department.  He also sees physical therapy there.  I am willing for 2 to 3 weeks to try her on 4 times a day oxycodone at 10 mg as needed and cyclobenzaprine at 5 mg 3 times a day as needed.  On her follow-up I will probably refer her to the local pain therapy/pain clinic.  In the past even when she is on regular narcotics she never seems to be comfortable.   I told Her to try topical capsaicin. 3.  Chronic depression and anxiety. Patient really needs to follow through with counseling on these issues.  These are the overriding issues in her life.     I have done the exam and reviewed the above chart and it is accurate to the best of my knowledge. DentistDragon  technology has been used in this note in any air is in the dictation or transcription are unintentional.  Megan Mansichard Gilbert Jr, MD  [facility]

## 2017-10-10 DIAGNOSIS — R69 Illness, unspecified: Secondary | ICD-10-CM | POA: Diagnosis not present

## 2017-10-31 ENCOUNTER — Ambulatory Visit: Payer: Medicare HMO | Admitting: Family Medicine

## 2017-11-02 ENCOUNTER — Encounter: Payer: Self-pay | Admitting: Family Medicine

## 2017-11-02 ENCOUNTER — Ambulatory Visit (INDEPENDENT_AMBULATORY_CARE_PROVIDER_SITE_OTHER): Payer: Medicare HMO | Admitting: Family Medicine

## 2017-11-02 VITALS — BP 140/82 | HR 68 | Temp 98.6°F | Resp 16 | Wt 176.0 lb

## 2017-11-02 DIAGNOSIS — K219 Gastro-esophageal reflux disease without esophagitis: Secondary | ICD-10-CM | POA: Diagnosis not present

## 2017-11-02 DIAGNOSIS — E039 Hypothyroidism, unspecified: Secondary | ICD-10-CM

## 2017-11-02 DIAGNOSIS — G894 Chronic pain syndrome: Secondary | ICD-10-CM | POA: Diagnosis not present

## 2017-11-02 DIAGNOSIS — F329 Major depressive disorder, single episode, unspecified: Secondary | ICD-10-CM

## 2017-11-02 DIAGNOSIS — M6289 Other specified disorders of muscle: Secondary | ICD-10-CM

## 2017-11-02 DIAGNOSIS — R69 Illness, unspecified: Secondary | ICD-10-CM | POA: Diagnosis not present

## 2017-11-02 DIAGNOSIS — F419 Anxiety disorder, unspecified: Secondary | ICD-10-CM

## 2017-11-02 DIAGNOSIS — K5903 Drug induced constipation: Secondary | ICD-10-CM

## 2017-11-02 MED ORDER — PANTOPRAZOLE SODIUM 40 MG PO TBEC: 40.0000 mg | DELAYED_RELEASE_TABLET | Freq: Every morning | ORAL | 5 refills | Status: DC

## 2017-11-02 NOTE — Patient Instructions (Signed)
Given samples of Movantik take one tablet daily.

## 2017-11-02 NOTE — Progress Notes (Signed)
Patient: Hannah Vance Female    DOB: 07/28/1955   62 y.o.   MRN: [id number] Visit Date: 11/02/2017  Today's Provider: [name], MD   Chief Complaint  Patient presents with  . Pain  . Depression  . Tick Removal   Subjective:    HPI Patient comes in today for a follow up. She was seen in the office 1 month ago. She was advised to start over the counter capsicin to help with her pain. Patient reports that she has not noticed any difference.   Patient also mentions that she feels nauseated and bloated for the last few days. She reports that her stomach sometimes feels "hard as a rock". She feels that she may be constipated.  She also recently has a ittle heartburn.  Patient reports that she removed a tick off of her leg about a week ago.     Allergies  Allergen Reactions  . Amoxicillin Other (See Comments)  . Morphine Other (See Comments)    Trouble walking, heavy legs  . Septra  [Sulfamethoxazole-Trimethoprim]   . Sulfa Antibiotics Other (See Comments)    burning  . Methadone Nausea And Vomiting     Current Outpatient Medications:  .  amoxicillin-clavulanate (AUGMENTIN) 875-125 MG tablet, Take 1 tablet by mouth 2 (two) times daily. (Patient not taking: Reported on 10/03/2017), Disp: 20 tablet, Rfl: 0 .  aspirin 81 MG EC tablet, Take by mouth., Disp: , Rfl:  .  Biotin 40981 MCG TABS, Take by mouth., Disp: , Rfl:  .  citalopram (CELEXA) 20 MG tablet, Take 1 tablet (20 mg total) by mouth daily., Disp: 30 tablet, Rfl: 11 .  clonazePAM (KLONOPIN) 0.5 MG tablet, Take 1 tablet (0.5 mg total) by mouth at bedtime as needed., Disp: 30 tablet, Rfl: 3 .  cyclobenzaprine (FLEXERIL) 5 MG tablet, Take 1 tablet (5 mg total) by mouth 3 (three) times daily as needed for muscle spasms., Disp: 90 tablet, Rfl: 5 .  fluticasone (FLONASE) 50 MCG/ACT nasal spray, Place 2 sprays into both nostrils daily., Disp: 16 g, Rfl: 12 .  levothyroxine (SYNTHROID, LEVOTHROID) 25 MCG tablet,  TAKE 1 TABLET BY MOUTH DAILY, Disp: 90 tablet, Rfl: 3 .  methylphenidate (RITALIN) 10 MG tablet, Take 1 tablet (10 mg total) by mouth 2 (two) times daily. 07/19/16, Disp: 60 tablet, Rfl: 0 .  Oxycodone HCl 10 MG TABS, Take 1 tablet (10 mg total) by mouth 4 (four) times daily as needed., Disp: 120 tablet, Rfl: 0 .  SUMAtriptan (IMITREX) 50 MG tablet, TAKE 1 TABLET (50 MG TOTAL) BY MOUTH ONCE. MAY REPEAT IN 2 HOURS IF HEADACHE PERSISTS OR RECURS., Disp: 9 tablet, Rfl: 1 .  vortioxetine HBr (TRINTELLIX) 20 MG TABS tablet, Take 1 tablet (20 mg total) by mouth daily., Disp: 90 tablet, Rfl: 12  Review of Systems  Constitutional: Negative for activity change, appetite change, chills, diaphoresis, fatigue, fever and unexpected weight change.  HENT: Negative.   Respiratory: Negative.   Cardiovascular: Negative for chest pain, palpitations and leg swelling.  Gastrointestinal: Positive for abdominal pain.  Musculoskeletal: Positive for arthralgias, back pain and myalgias.  Allergic/Immunologic: Positive for environmental allergies.  Neurological: Positive for headaches. Negative for dizziness, tremors and weakness.  Psychiatric/Behavioral: Positive for agitation and dysphoric mood.    Social History   Tobacco Use  . Smoking status: Never Smoker  . Smokeless tobacco: Never Used  Substance Use Topics  . Alcohol use: No    Alcohol/week: 0.0  standard drinks   Objective:   BP 140/82 (BP Location: Right Arm, Patient Position: Sitting, Cuff Size: Normal)   Pulse 68   Temp 98.6 F (37 C)   Resp 16   Wt 176 lb (79.8 kg)   SpO2 98%   BMI 28.41 kg/m  Vitals:   11/02/17 1637  BP: 140/82  Pulse: 68  Resp: 16  Temp: 98.6 F (37 C)  SpO2: 98%  Weight: 176 lb (79.8 kg)     Physical Exam  Constitutional: She is oriented to person, place, and time. She appears well-developed and well-nourished.  HENT:  Head: Normocephalic and atraumatic.  Right Ear: External ear normal.  Left Ear: External  ear normal.  Nose: Nose normal.  Eyes: Conjunctivae are normal. No scleral icterus.  Neck: No thyromegaly present.  Cardiovascular: Normal rate, regular rhythm and normal heart sounds.  Pulmonary/Chest: Effort normal.  Abdominal: Soft.  Musculoskeletal: She exhibits no edema.  Neurological: She is alert and oriented to person, place, and time.  Skin: Skin is warm and dry.  Psychiatric: Her behavior is normal. Thought content normal.        Assessment & Plan:     1. Adult hypothyroidism   2. Pelvic floor dysfunction   3. Current episode of major depressive disorder without prior episode, unspecified depression episode severity Psychiatic issues are primary --needs psychiatris and counseling. Not suicidal.  4. Chronic pain syndrome   5. Anxiety   6. Constipation due to pain medication Try Movantik daily for 6 days. She is having 2 BMs per week. 7.GERD Try Prilosec daily. RTC 2-3 weeks.     I have done the exam and reviewed the above chart and it is accurate to the best of my knowledge. Dentist has been used in this note in any air is in the dictation or transcription are unintentional.  [name], MD  [facility]

## 2017-11-10 DIAGNOSIS — R198 Other specified symptoms and signs involving the digestive system and abdomen: Secondary | ICD-10-CM | POA: Diagnosis not present

## 2017-11-10 DIAGNOSIS — Z1231 Encounter for screening mammogram for malignant neoplasm of breast: Secondary | ICD-10-CM | POA: Diagnosis not present

## 2017-11-10 DIAGNOSIS — Z1239 Encounter for other screening for malignant neoplasm of breast: Secondary | ICD-10-CM | POA: Diagnosis not present

## 2017-11-10 DIAGNOSIS — K640 First degree hemorrhoids: Secondary | ICD-10-CM | POA: Diagnosis not present

## 2017-11-22 ENCOUNTER — Other Ambulatory Visit: Payer: Self-pay | Admitting: Family Medicine

## 2017-11-22 DIAGNOSIS — F329 Major depressive disorder, single episode, unspecified: Secondary | ICD-10-CM

## 2017-11-22 MED ORDER — METHYLPHENIDATE HCL 10 MG PO TABS: 10.0000 mg | ORAL_TABLET | Freq: Two times a day (BID) | ORAL | 0 refills | Status: DC

## 2017-11-22 MED ORDER — OXYCODONE HCL 10 MG PO TABS: 10.0000 mg | ORAL_TABLET | Freq: Four times a day (QID) | ORAL | 0 refills | Status: DC | PRN

## 2017-11-22 NOTE — Telephone Encounter (Signed)
Pt contacted office for refill request on the following medications:  1. methylphenidate (RITALIN) 10 MG tablet   2. Oxycodone HCl 10 MG TABS  [facility] [address]  Please advise. Thanks TNP

## 2017-12-05 DIAGNOSIS — Z1231 Encounter for screening mammogram for malignant neoplasm of breast: Secondary | ICD-10-CM | POA: Diagnosis not present

## 2017-12-05 DIAGNOSIS — Z1239 Encounter for other screening for malignant neoplasm of breast: Secondary | ICD-10-CM | POA: Diagnosis not present

## 2017-12-05 LAB — HM MAMMOGRAPHY

## 2018-01-02 ENCOUNTER — Ambulatory Visit: Payer: Self-pay | Admitting: Family Medicine

## 2018-01-17 ENCOUNTER — Ambulatory Visit: Payer: Self-pay | Admitting: Family Medicine

## 2018-01-22 ENCOUNTER — Other Ambulatory Visit: Payer: Self-pay | Admitting: Family Medicine

## 2018-01-22 DIAGNOSIS — F329 Major depressive disorder, single episode, unspecified: Secondary | ICD-10-CM

## 2018-01-22 NOTE — Telephone Encounter (Signed)
Patient needs refill on Methylphenidate 10 mg. [facility]  [address]

## 2018-01-22 NOTE — Telephone Encounter (Signed)
Pt needing refill :  Oxycodone HCl 10 MG TABS  Thanks, [facility]

## 2018-01-22 NOTE — Addendum Note (Signed)
Addended by: Janey GreaserESANTO, Kensi Karr D on: 01/22/2018 03:00 PM   Modules accepted: Orders

## 2018-01-22 NOTE — Telephone Encounter (Signed)
Pt needing a refill on:  Oxycodone HCl 10 MG TABS   Please refill at: [id number] [place], [place] - [address] [phone] (Phone) [phone] (Fax)    Thanks, [facility]

## 2018-01-22 NOTE — Telephone Encounter (Signed)
Please review. Thanks!  

## 2018-01-23 ENCOUNTER — Ambulatory Visit: Payer: Medicare HMO

## 2018-01-23 MED ORDER — METHYLPHENIDATE HCL 10 MG PO TABS: 10.0000 mg | ORAL_TABLET | Freq: Two times a day (BID) | ORAL | 0 refills | Status: DC

## 2018-01-23 MED ORDER — OXYCODONE HCL 10 MG PO TABS: 10.0000 mg | ORAL_TABLET | Freq: Four times a day (QID) | ORAL | 0 refills | Status: DC | PRN

## 2018-02-12 ENCOUNTER — Ambulatory Visit (INDEPENDENT_AMBULATORY_CARE_PROVIDER_SITE_OTHER): Payer: Medicare HMO | Admitting: Family Medicine

## 2018-02-12 ENCOUNTER — Encounter: Payer: Self-pay | Admitting: Family Medicine

## 2018-02-12 VITALS — BP 122/62 | HR 72 | Temp 98.4°F | Resp 16 | Ht 66.0 in | Wt 178.0 lb

## 2018-02-12 DIAGNOSIS — G629 Polyneuropathy, unspecified: Secondary | ICD-10-CM | POA: Diagnosis not present

## 2018-02-12 DIAGNOSIS — G894 Chronic pain syndrome: Secondary | ICD-10-CM | POA: Diagnosis not present

## 2018-02-12 DIAGNOSIS — R109 Unspecified abdominal pain: Secondary | ICD-10-CM | POA: Diagnosis not present

## 2018-02-12 DIAGNOSIS — M6289 Other specified disorders of muscle: Secondary | ICD-10-CM

## 2018-02-12 LAB — POCT URINALYSIS DIPSTICK
BILIRUBIN UA: NEGATIVE
Glucose, UA: NEGATIVE
Ketones, UA: NEGATIVE
Leukocytes, UA: NEGATIVE
Nitrite, UA: NEGATIVE
Odor: NEGATIVE
PH UA: 6 (ref 5.0–8.0)
Protein, UA: NEGATIVE
SPEC GRAV UA: 1.02 (ref 1.010–1.025)
Urobilinogen, UA: 0.2 E.U./dL

## 2018-02-12 MED ORDER — NALOXEGOL OXALATE 25 MG PO TABS: 25.0000 mg | ORAL_TABLET | Freq: Every day | ORAL | 5 refills | Status: DC

## 2018-02-12 NOTE — Progress Notes (Addendum)
Patient: Hannah Vance Female    DOB: Jun 16, 1955   63 y.o.   MRN: [id number] Visit Date: 02/12/2018  Today's Provider: Megan Mansichard  Jr, MD   Chief Complaint  Patient presents with  . Follow-up   Subjective:     HPI  Patient comes in today for a follow up. She was last seen in the office 2 months ago. She was advised to start Movantik to help with constipation. She reports that it helped with her symptoms.   Patient was also started on Prilosec and she reports that it has helped with her stomach issues also.   She is also due for a refill on Trintellix and brought in paperwork to fill out for the patient assistance program.  Allergies  Allergen Reactions  . Amoxicillin Other (See Comments)  . Morphine Other (See Comments)    Trouble walking, heavy legs  . Septra  [Sulfamethoxazole-Trimethoprim]   . Sulfa Antibiotics Other (See Comments)    burning  . Methadone Nausea And Vomiting     Current Outpatient Medications:  .  aspirin 81 MG EC tablet, Take by mouth., Disp: , Rfl:  .  Biotin 4098110000 MCG TABS, Take by mouth., Disp: , Rfl:  .  citalopram (CELEXA) 20 MG tablet, Take 1 tablet (20 mg total) by mouth daily., Disp: 30 tablet, Rfl: 11 .  clonazePAM (KLONOPIN) 0.5 MG tablet, Take 1 tablet (0.5 mg total) by mouth at bedtime as needed., Disp: 30 tablet, Rfl: 3 .  cyclobenzaprine (FLEXERIL) 5 MG tablet, Take 1 tablet (5 mg total) by mouth 3 (three) times daily as needed for muscle spasms., Disp: 90 tablet, Rfl: 5 .  fluticasone (FLONASE) 50 MCG/ACT nasal spray, Place 2 sprays into both nostrils daily., Disp: 16 g, Rfl: 12 .  levothyroxine (SYNTHROID, LEVOTHROID) 25 MCG tablet, TAKE 1 TABLET BY MOUTH DAILY, Disp: 90 tablet, Rfl: 3 .  methylphenidate (RITALIN) 10 MG tablet, Take 1 tablet (10 mg total) by mouth 2 (two) times daily. 07/19/16, Disp: 60 tablet, Rfl: 0 .  Oxycodone HCl 10 MG TABS, Take 1 tablet (10 mg total) by mouth 4 (four) times daily as needed., Disp: 110  tablet, Rfl: 0 .  pantoprazole (PROTONIX) 40 MG tablet, Take 1 tablet (40 mg total) by mouth every morning., Disp: 30 tablet, Rfl: 5 .  vortioxetine HBr (TRINTELLIX) 20 MG TABS tablet, Take 1 tablet (20 mg total) by mouth daily., Disp: 90 tablet, Rfl: 12 .  amoxicillin-clavulanate (AUGMENTIN) 875-125 MG tablet, Take 1 tablet by mouth 2 (two) times daily. (Patient not taking: Reported on 10/03/2017), Disp: 20 tablet, Rfl: 0 .  SUMAtriptan (IMITREX) 50 MG tablet, TAKE 1 TABLET (50 MG TOTAL) BY MOUTH ONCE. MAY REPEAT IN 2 HOURS IF HEADACHE PERSISTS OR RECURS., Disp: 9 tablet, Rfl: 1  Review of Systems  Constitutional: Negative.   Eyes: Negative.   Respiratory: Negative for cough and shortness of breath.   Cardiovascular: Negative for chest pain, palpitations and leg swelling.  Gastrointestinal: Positive for constipation.       Improved.  Endocrine: Negative for cold intolerance, heat intolerance and polydipsia.  Genitourinary: Positive for flank pain.  Musculoskeletal: Positive for arthralgias and myalgias.  Allergic/Immunologic: Negative.   Neurological: Negative for dizziness, light-headedness and headaches.  Psychiatric/Behavioral: The patient is nervous/anxious.     Social History   Tobacco Use  . Smoking status: Never Smoker  . Smokeless tobacco: Never Used  Substance Use Topics  . Alcohol use: No  Alcohol/week: 0.0 standard drinks      Objective:   BP 122/62 (BP Location: Left Arm, Patient Position: Sitting, Cuff Size: Normal)   Pulse 72   Temp 98.4 F (36.9 C)   Resp 16   Ht 5\' 6"  (1.676 m)   Wt 178 lb (80.7 kg)   SpO2 97%   BMI 28.73 kg/m  Vitals:   02/12/18 1636  BP: 122/62  Pulse: 72  Resp: 16  Temp: 98.4 F (36.9 C)  SpO2: 97%  Weight: 178 lb (80.7 kg)  Height: 5\' 6"  (1.676 m)     Physical Exam Constitutional:      Appearance: Normal appearance. She is well-developed.     Comments: Pleasant white female wonders about the room during the exam is she  states that she is unable to find a comfortable position.  HENT:     Head: Normocephalic and atraumatic.  Eyes:     General: No scleral icterus.    Conjunctiva/sclera: Conjunctivae normal.  Neck:     Thyroid: No thyromegaly.  Cardiovascular:     Rate and Rhythm: Normal rate and regular rhythm.     Heart sounds: Normal heart sounds.  Pulmonary:     Effort: Pulmonary effort is normal.     Breath sounds: Normal breath sounds.  Abdominal:     Palpations: Abdomen is soft.  Skin:    General: Skin is warm and dry.  Neurological:     General: No focal deficit present.     Mental Status: She is alert and oriented to person, place, and time. Mental status is at baseline.  Psychiatric:        Mood and Affect: Mood normal.        Behavior: Behavior normal.        Thought Content: Thought content normal.        Judgment: Judgment normal.         Assessment & Plan    1. Right flank pain Appears to be MSK.Work up for internal issues with [place] if persitant. - POCT urinalysis dipstick - Urine Culture  2. Pelvic floor dysfunction Per [facility].  3. Chronic pain syndrome Plan to cut back on narcotics.  4. Neuropathy 5.constipation Due to old opioids.  Movantik helped.  Consider CCM consultation to help the patient get Movantik but also overall consideration of help    I have done the exam and reviewed the above chart and it is accurate to the best of my knowledge. Dentist has been used in this note in any air is in the dictation or transcription are unintentional.  [name], MD  [facility]

## 2018-02-15 LAB — URINE CULTURE

## 2018-02-20 ENCOUNTER — Telehealth: Payer: Self-pay

## 2018-02-20 DIAGNOSIS — R69 Illness, unspecified: Secondary | ICD-10-CM

## 2018-02-20 MED ORDER — DOXYCYCLINE HYCLATE 100 MG PO TABS: 100.0000 mg | ORAL_TABLET | Freq: Two times a day (BID) | ORAL | 0 refills | Status: DC

## 2018-02-20 NOTE — Telephone Encounter (Signed)
Lets consider CCM consult to see if they can help her with this.

## 2018-02-20 NOTE — Telephone Encounter (Signed)
Patient states she is currently still having her pain and the doxycycline 100mg  was send to pharmacy.  Patient state that she could not afford the Movantik 25 mg tablets. Patient states she called [facility] and they told her she would have to pay $286.00 out of pocket for the medication. [facility] if [facility] could [facility] lower price.

## 2018-02-20 NOTE — Telephone Encounter (Signed)
Order for CCM has been placed.

## 2018-02-20 NOTE — Telephone Encounter (Signed)
-----   Message from Maple Hudson., MD sent at 02/19/2018  2:14 PM EST ----- If flank pain is better I would not do anything about this urine culture.  If she is still having the flank pain on the right I would consider doxycycline twice a day for 5 days due to her multiple drug allergies.

## 2018-02-21 ENCOUNTER — Ambulatory Visit: Payer: Self-pay | Admitting: Pharmacist

## 2018-02-21 DIAGNOSIS — K5903 Drug induced constipation: Secondary | ICD-10-CM

## 2018-02-21 DIAGNOSIS — R69 Illness, unspecified: Secondary | ICD-10-CM

## 2018-02-21 NOTE — Chronic Care Management (AMB) (Signed)
  Chronic Care Management   Note  02/21/2018 Name: Hannah Vance MRN: [id number] DOB: 1956-01-09  Hannah Vance is a 63 y.o. year old female who sees Maple Hudson., MD for primary care. Dr. Sullivan Lone asked the CCM team to consult the patient for assistance with chronic disease management related to medication assistance for Movantik. Referral was placed 02/20/2018. Telephone outreach to patient today to introduce CCM services.   Hannah Vance politely declined CCM services. CCM pharmacist reviewed eligibility for Movantik medication assistance program facilitated by [place] [employer] [place]. Patient must spend 3% of annual household income on prescription costs to be eligible for program  Plan:  Patient was provided CCM team contact information should she have any questions or needs in the future. She is welcome to contact CCM pharmacist when she is closer to eligibility for [place] [employer] [place] medication assistance program    Karalee Height, PharmD Clinical Pharmacist [facility] [phone]

## 2018-03-02 DIAGNOSIS — M62838 Other muscle spasm: Secondary | ICD-10-CM | POA: Diagnosis not present

## 2018-03-05 ENCOUNTER — Other Ambulatory Visit: Payer: Self-pay | Admitting: Family Medicine

## 2018-03-05 NOTE — Telephone Encounter (Signed)
Patient needs refills on [facility]am [facility] [address]   Patient had 2 refills left that have expired on this med.

## 2018-03-07 ENCOUNTER — Other Ambulatory Visit: Payer: Self-pay | Admitting: Family Medicine

## 2018-03-07 MED ORDER — CLONAZEPAM 0.5 MG PO TABS: 0.5000 mg | ORAL_TABLET | Freq: Every evening | ORAL | 3 refills | Status: DC | PRN

## 2018-03-14 ENCOUNTER — Other Ambulatory Visit: Payer: Self-pay | Admitting: Family Medicine

## 2018-03-14 NOTE — Telephone Encounter (Signed)
Pt needs a refill on oxycodone HCI 10mg   [facility] [address]  Thanks  teri

## 2018-03-15 MED ORDER — OXYCODONE HCL 10 MG PO TABS: 10.0000 mg | ORAL_TABLET | Freq: Four times a day (QID) | ORAL | 0 refills | Status: DC | PRN

## 2018-03-26 ENCOUNTER — Other Ambulatory Visit: Payer: Self-pay | Admitting: Family Medicine

## 2018-03-26 DIAGNOSIS — F32A Depression, unspecified: Secondary | ICD-10-CM

## 2018-03-26 DIAGNOSIS — F329 Major depressive disorder, single episode, unspecified: Secondary | ICD-10-CM

## 2018-03-26 NOTE — Telephone Encounter (Signed)
Please review. Thanks!  

## 2018-03-26 NOTE — Telephone Encounter (Signed)
Pt needing refills on:  levothyroxine (SYNTHROID, LEVOTHROID) 25 MCG tablet  methylphenidate (RITALIN) 10 MG tablet  [facility] fill at: [facility] [address] [facility], [place] - [address] [phone] (Phone) [phone] (Fax)   Thanks, [employer]

## 2018-03-28 MED ORDER — LEVOTHYROXINE SODIUM 25 MCG PO TABS: 25.0000 ug | ORAL_TABLET | Freq: Every day | ORAL | 3 refills | Status: DC

## 2018-03-28 MED ORDER — METHYLPHENIDATE HCL 10 MG PO TABS: 10.0000 mg | ORAL_TABLET | Freq: Two times a day (BID) | ORAL | 0 refills | Status: DC

## 2018-04-12 ENCOUNTER — Telehealth: Payer: Self-pay

## 2018-04-12 DIAGNOSIS — F329 Major depressive disorder, single episode, unspecified: Secondary | ICD-10-CM

## 2018-04-12 DIAGNOSIS — R102 Pelvic and perineal pain: Secondary | ICD-10-CM

## 2018-04-12 DIAGNOSIS — G894 Chronic pain syndrome: Secondary | ICD-10-CM

## 2018-04-12 DIAGNOSIS — G8929 Other chronic pain: Secondary | ICD-10-CM

## 2018-04-12 DIAGNOSIS — F419 Anxiety disorder, unspecified: Secondary | ICD-10-CM

## 2018-04-12 DIAGNOSIS — Z659 Problem related to unspecified psychosocial circumstances: Secondary | ICD-10-CM

## 2018-04-12 NOTE — Telephone Encounter (Signed)
Patient called requesting to speak to [facility] about her medications. Patient states she messed up on her Trintellix prescription. She states the application for Raelene Bott was sent in to soon and she needs to discuss this with [place].

## 2018-04-16 NOTE — Telephone Encounter (Signed)
CCM to help with patient.  Referral made

## 2018-04-17 ENCOUNTER — Ambulatory Visit: Payer: Self-pay | Admitting: Pharmacist

## 2018-04-17 ENCOUNTER — Encounter: Payer: Self-pay | Admitting: Pharmacist

## 2018-04-17 ENCOUNTER — Other Ambulatory Visit: Payer: Self-pay

## 2018-04-17 DIAGNOSIS — F419 Anxiety disorder, unspecified: Secondary | ICD-10-CM

## 2018-04-17 DIAGNOSIS — F329 Major depressive disorder, single episode, unspecified: Secondary | ICD-10-CM

## 2018-04-17 MED ORDER — VORTIOXETINE HBR 20 MG PO TABS: 20.0000 mg | ORAL_TABLET | Freq: Every day | ORAL | 12 refills | Status: DC

## 2018-04-17 NOTE — Chronic Care Management (AMB) (Signed)
  Chronic Care Management   Note  04/17/2018 Name: Hannah Vance MRN: [id number] DOB: [birth year]  Received referral from Loran Senters, nurse for Dr. [name] regarding medication assistance, Trintellix. [name] previously has enrolled in Trintellix program, expired 12 31 2019.   STAT: Patient requiring Trintellix 20mg  1 tablet once daily [facility] Lone while the issue with her medication assistance is resolved.   Raelene Bott Industrial/product designer of [employer])- spoke with Manufacturing systems engineer. Ryan reads [name]'s file: on January 6 patient was notified she could not use last year's proof of income. On 04/08/18, [employer] received new proof of income. On 04/12/18, Patient was adviesed that Raelene Bott needs new Trintellix application.   Of note, patient states she brought new application to Dr. Maree Krabbe on 02/12/18 and Dr. Sullivan Lone was to submit it for her. Paperwork is mentioned in note but no application is scanned in to media tab. [employer] Does NOT have new application for 2020, just proof of income.   Will assist Dr. Sullivan Lone with Raelene Bott paperwork. Expedited approval will take 3-5 business days, and expedited shipping takes 3 days.   Follow up plan: The CM team will reach out to the patient again over the next 3 days.   Karalee Height, PharmD Clinical Pharmacist [facility] [phone]

## 2018-04-17 NOTE — Progress Notes (Signed)
This encounter was created in error - please disregard.

## 2018-04-18 ENCOUNTER — Telehealth: Payer: Self-pay

## 2018-04-18 ENCOUNTER — Telehealth: Payer: Self-pay | Admitting: *Deleted

## 2018-04-18 NOTE — Telephone Encounter (Signed)
Returned call to patient, she was unavailable. Voicemail did not pick up, disconnected call after >15 rings.   Will call back in the AM.   Karalee Height, PharmD Clinical Pharmacist [facility] [phone]

## 2018-04-18 NOTE — Telephone Encounter (Signed)
Patient called office requesting a call back from [place].

## 2018-04-19 ENCOUNTER — Ambulatory Visit: Payer: Self-pay | Admitting: Pharmacist

## 2018-04-19 ENCOUNTER — Other Ambulatory Visit: Payer: Self-pay

## 2018-04-19 DIAGNOSIS — F329 Major depressive disorder, single episode, unspecified: Secondary | ICD-10-CM

## 2018-04-19 DIAGNOSIS — F419 Anxiety disorder, unspecified: Secondary | ICD-10-CM

## 2018-04-23 ENCOUNTER — Other Ambulatory Visit: Payer: Self-pay | Admitting: Family Medicine

## 2018-04-23 ENCOUNTER — Telehealth: Payer: Self-pay | Admitting: Pharmacist

## 2018-04-23 NOTE — Telephone Encounter (Signed)
Dr. Sullivan Lone signed this application on 02/15/2018. Will re-fax again to the assistance program and see if this helps.

## 2018-04-23 NOTE — Telephone Encounter (Signed)
Pt called asking for a refill on  Pantoprazole 40 mg   Oxycodone 10 mg  She is not out of the medications   [facility] [address]  CB[facility]  [phone]  Thanks Barth Kirks

## 2018-04-23 NOTE — Telephone Encounter (Signed)
Another incoming call from Ms. Mesmer regarding her Trintellix 20mg  once daily prescription.   To review: The assistance program DOES NOT have a 2020 application on file. This has previously been expressed to Ms. Stuck by myself personally at least twice and per [facility] call records, at least once. Ms. Weichert states Dr. Sullivan Lone sent it in at her appointment in January, but there is no record of this.   I have given Ms. Goering the only Trinellix samples that the office has and recorded in the sample log last week on 04/19/18.    There is nothing further I can do until a new application is sent in by Ms. Sistare. This application will need to be signed by Dr. Sullivan Lone. I am unable to come in to the practice to assist based on self quarantine at this time.   Karalee Height, PharmD Clinical Pharmacist [facility] [phone]

## 2018-04-24 MED ORDER — OXYCODONE HCL 10 MG PO TABS: 10.0000 mg | ORAL_TABLET | Freq: Four times a day (QID) | ORAL | 0 refills | Status: DC | PRN

## 2018-04-24 NOTE — Telephone Encounter (Signed)
Please review. We just sent in refills on pantoprazole. Thanks!

## 2018-04-25 NOTE — Telephone Encounter (Signed)
Pt calling back to check on status and what to expect since she will be without the Trintellix.  Please advise.  Thanks, [employer]

## 2018-05-08 ENCOUNTER — Other Ambulatory Visit: Payer: Self-pay

## 2018-05-08 MED ORDER — LORATADINE 10 MG PO TABS: 10.0000 mg | ORAL_TABLET | Freq: Every day | ORAL | 11 refills | Status: AC

## 2018-05-08 NOTE — Telephone Encounter (Signed)
Patient would like for loratadine 10 mg to be send in for her due to allergies, please advise L.O.V.02/12/2018.

## 2018-05-08 NOTE — Telephone Encounter (Signed)
Patient was advised.  

## 2018-05-11 NOTE — Chronic Care Management (AMB) (Signed)
  Care Management   Follow Up Note   04/19/18 Name: Hannah Vance MRN: [id number] DOB: 06-Jan-1956  Referred by: Maple Hudson., MD Reason for referral : Chronic Care Management (medication assistance)   Subjective: 63 year old female patient of Dr. [name] engaged with clinical pharmacist for [employer] medication assistance re-application. Per communication with Raelene Bott, they have received [name]'s financial statements but never received the signed application.  Assessment: Collaboration with Dr. Lynnell Grain team to ensure completion of application.   Review of patient status, including review of consultants reports, relevant laboratory and other test results, and collaboration with appropriate care team members and the patient's provider was performed as part of comprehensive patient evaluation and provision of chronic care management services.     Follow Up Plan: [place], CMA for Dr. [name], has submitted patient's application as requested. I will remove myself from the care team.  Karalee Height, PharmD Clinical Pharmacist [facility] [phone]

## 2018-05-16 ENCOUNTER — Encounter: Payer: Self-pay | Admitting: Family Medicine

## 2018-05-21 ENCOUNTER — Other Ambulatory Visit: Payer: Self-pay

## 2018-05-21 DIAGNOSIS — F329 Major depressive disorder, single episode, unspecified: Secondary | ICD-10-CM

## 2018-05-21 MED ORDER — METHYLPHENIDATE HCL 10 MG PO TABS: 10.0000 mg | ORAL_TABLET | Freq: Two times a day (BID) | ORAL | 0 refills | Status: DC

## 2018-05-21 NOTE — Telephone Encounter (Signed)
Patient is requesting a refill on her Ritalin mg. L.O.V. 02/12/2018,please advise.

## 2018-05-21 NOTE — Telephone Encounter (Signed)
Please Review

## 2018-06-18 ENCOUNTER — Other Ambulatory Visit: Payer: Self-pay

## 2018-06-18 NOTE — Telephone Encounter (Signed)
Please review. Thanks!  

## 2018-06-19 MED ORDER — OXYCODONE HCL 10 MG PO TABS: 10.0000 mg | ORAL_TABLET | Freq: Four times a day (QID) | ORAL | 0 refills | Status: DC | PRN

## 2018-07-20 ENCOUNTER — Other Ambulatory Visit: Payer: Self-pay

## 2018-07-20 DIAGNOSIS — F329 Major depressive disorder, single episode, unspecified: Secondary | ICD-10-CM

## 2018-07-20 NOTE — Telephone Encounter (Signed)
Patient request refill

## 2018-07-23 MED ORDER — METHYLPHENIDATE HCL 10 MG PO TABS: 10.0000 mg | ORAL_TABLET | Freq: Two times a day (BID) | ORAL | 0 refills | Status: DC

## 2018-07-31 ENCOUNTER — Other Ambulatory Visit: Payer: Self-pay | Admitting: Family Medicine

## 2018-07-31 MED ORDER — OXYCODONE HCL 10 MG PO TABS: 10.0000 mg | ORAL_TABLET | Freq: Four times a day (QID) | ORAL | 0 refills | Status: DC | PRN

## 2018-07-31 NOTE — Telephone Encounter (Signed)
Pt contacted office for refill request on the following medications:  Oxycodone HCl 10 MG TABS  [facility]  Please advise. Thanks TNP

## 2018-07-31 NOTE — Telephone Encounter (Signed)
Please review. Thanks!  

## 2018-09-05 ENCOUNTER — Other Ambulatory Visit: Payer: Self-pay | Admitting: Family Medicine

## 2018-09-05 NOTE — Telephone Encounter (Signed)
Needing refill on:  Oxycodone HCl 10 MG TABS Pelvic procedure being done on Friday.  Asking to have medication filled before procedure.  Not wanting to drive.  Please fill at:  [id number] [place] [place] [phone] (Phone) [phone] (Fax)    Thanks, [facility]

## 2018-09-06 MED ORDER — OXYCODONE HCL 10 MG PO TABS: 10.0000 mg | ORAL_TABLET | Freq: Four times a day (QID) | ORAL | 0 refills | Status: DC | PRN

## 2018-09-07 DIAGNOSIS — M62838 Other muscle spasm: Secondary | ICD-10-CM | POA: Diagnosis not present

## 2018-09-07 DIAGNOSIS — R1032 Left lower quadrant pain: Secondary | ICD-10-CM | POA: Diagnosis not present

## 2018-09-07 DIAGNOSIS — R102 Pelvic and perineal pain: Secondary | ICD-10-CM | POA: Diagnosis not present

## 2018-09-07 DIAGNOSIS — G894 Chronic pain syndrome: Secondary | ICD-10-CM | POA: Diagnosis not present

## 2018-09-07 DIAGNOSIS — N952 Postmenopausal atrophic vaginitis: Secondary | ICD-10-CM | POA: Diagnosis not present

## 2018-09-12 ENCOUNTER — Encounter: Payer: Medicare HMO | Admitting: Family Medicine

## 2018-09-13 ENCOUNTER — Other Ambulatory Visit: Payer: Self-pay | Admitting: Family Medicine

## 2018-09-13 MED ORDER — CLONAZEPAM 0.5 MG PO TABS: 0.5000 mg | ORAL_TABLET | Freq: Every evening | ORAL | 1 refills | Status: DC | PRN

## 2018-09-13 NOTE — Telephone Encounter (Signed)
Pt contacted office for refill request on the following medications:  clonazePAM (KLONOPIN) 0.5 MG tablet   [facility] [address] [facility]  LOV: 02/12/2018 Please advise. Thanks TNP

## 2018-09-18 ENCOUNTER — Other Ambulatory Visit: Payer: Self-pay | Admitting: Family Medicine

## 2018-09-18 DIAGNOSIS — F329 Major depressive disorder, single episode, unspecified: Secondary | ICD-10-CM

## 2018-09-18 NOTE — Telephone Encounter (Signed)
Please review. Thanks!  

## 2018-09-18 NOTE — Telephone Encounter (Signed)
Pt contacted office for refill request on the following medications:  methylphenidate (RITALIN) 10 MG tablet   [facility] LOV: 02/12/2018 Please advise. Thanks TNP

## 2018-09-20 ENCOUNTER — Telehealth: Payer: Self-pay | Admitting: Family Medicine

## 2018-09-20 MED ORDER — METHYLPHENIDATE HCL 10 MG PO TABS: 10.0000 mg | ORAL_TABLET | Freq: Two times a day (BID) | ORAL | 0 refills | Status: DC

## 2018-09-20 NOTE — Telephone Encounter (Signed)
Pt needs refill on  Methylphenidate 10 mg  [facility]  [place]

## 2018-09-20 NOTE — Telephone Encounter (Signed)
This was sent in today.  

## 2018-10-16 ENCOUNTER — Other Ambulatory Visit: Payer: Self-pay

## 2018-10-16 NOTE — Telephone Encounter (Signed)
Patient is requesting a refill on Oxycodone. 

## 2018-10-17 MED ORDER — OXYCODONE HCL 10 MG PO TABS: 10.0000 mg | ORAL_TABLET | Freq: Four times a day (QID) | ORAL | 0 refills | Status: DC | PRN

## 2018-10-31 ENCOUNTER — Telehealth: Payer: Self-pay | Admitting: Family Medicine

## 2018-11-17 DIAGNOSIS — Z01812 Encounter for preprocedural laboratory examination: Secondary | ICD-10-CM | POA: Diagnosis not present

## 2018-11-17 DIAGNOSIS — Z20828 Contact with and (suspected) exposure to other viral communicable diseases: Secondary | ICD-10-CM | POA: Diagnosis not present

## 2018-11-19 ENCOUNTER — Other Ambulatory Visit: Payer: Self-pay | Admitting: Family Medicine

## 2018-11-19 DIAGNOSIS — F329 Major depressive disorder, single episode, unspecified: Secondary | ICD-10-CM

## 2018-11-19 NOTE — Telephone Encounter (Signed)
Pt needing refills on: methylphenidate (RITALIN) 10 MG tablet Oxycodone HCl 10 MG TABS  Please fill at:  [id number] [address], [place] [phone] (Phone) [phone] (Fax)   Thanks, [employer]

## 2018-11-20 DIAGNOSIS — R69 Illness, unspecified: Secondary | ICD-10-CM | POA: Diagnosis not present

## 2018-11-20 DIAGNOSIS — R102 Pelvic and perineal pain: Secondary | ICD-10-CM | POA: Diagnosis not present

## 2018-11-20 DIAGNOSIS — N907 Vulvar cyst: Secondary | ICD-10-CM | POA: Diagnosis not present

## 2018-11-20 DIAGNOSIS — E669 Obesity, unspecified: Secondary | ICD-10-CM | POA: Diagnosis not present

## 2018-11-20 DIAGNOSIS — M542 Cervicalgia: Secondary | ICD-10-CM | POA: Diagnosis not present

## 2018-11-20 DIAGNOSIS — L723 Sebaceous cyst: Secondary | ICD-10-CM | POA: Diagnosis not present

## 2018-11-20 DIAGNOSIS — M7912 Myalgia of auxiliary muscles, head and neck: Secondary | ICD-10-CM | POA: Diagnosis not present

## 2018-11-20 DIAGNOSIS — G8929 Other chronic pain: Secondary | ICD-10-CM | POA: Diagnosis not present

## 2018-11-20 DIAGNOSIS — M62838 Other muscle spasm: Secondary | ICD-10-CM | POA: Diagnosis not present

## 2018-11-20 DIAGNOSIS — M545 Low back pain: Secondary | ICD-10-CM | POA: Diagnosis not present

## 2018-11-21 MED ORDER — METHYLPHENIDATE HCL 10 MG PO TABS: 10.0000 mg | ORAL_TABLET | Freq: Two times a day (BID) | ORAL | 0 refills | Status: DC

## 2018-11-21 MED ORDER — OXYCODONE HCL 10 MG PO TABS: 10.0000 mg | ORAL_TABLET | Freq: Four times a day (QID) | ORAL | 0 refills | Status: DC | PRN

## 2018-12-21 ENCOUNTER — Other Ambulatory Visit: Payer: Self-pay | Admitting: Family Medicine

## 2018-12-21 MED ORDER — CLONAZEPAM 0.5 MG PO TABS: 0.5000 mg | ORAL_TABLET | Freq: Every evening | ORAL | 0 refills | Status: DC | PRN

## 2018-12-21 NOTE — Telephone Encounter (Signed)
Pt needing a refill on:  clonazePAM (KLONOPIN) 0.5 MG tablet   Please call into:  [id number] [address], [place] [phone] (Phone) [phone] (Fax)   Thanks, [employer]

## 2018-12-23 ENCOUNTER — Other Ambulatory Visit: Payer: Self-pay | Admitting: Family Medicine

## 2018-12-25 NOTE — Progress Notes (Signed)
Patient: Hannah Vance Female    DOB: Dec 01, 1955   63 y.o.   MRN: [id number] Visit Date: 12/26/2018  Today's Provider: [name], MD   Chief Complaint  Patient presents with  . Follow-up   Subjective:     HPI  Patient has chronic back pain and chronic pelvic pain.  She is followed by [facility] gynecology for her pelvic pain. She continues to have problems with depression and anxiety but they are under control. Chronic pain syndrome FROM 02/12/2018-Plan to cut back on narcotics.  Neuropathy FROM 02/12/2018-Plan to cut back on narcotics.  Constipation From 02/12/2018-Due to old opioids. Movantik helped. Consider CCM consultation to help the patient get Movantik but also overall consideration of help.  Allergies  Allergen Reactions  . Amoxicillin Other (See Comments)  . Morphine Other (See Comments)    Trouble walking, heavy legs  . Septra  [Sulfamethoxazole-Trimethoprim]   . Sulfa Antibiotics Other (See Comments)    burning  . Methadone Nausea And Vomiting     Current Outpatient Medications:  .  aspirin 81 MG EC tablet, Take by mouth., Disp: , Rfl:  .  clonazePAM (KLONOPIN) 0.5 MG tablet, Take 1 tablet (0.5 mg total) by mouth at bedtime as needed., Disp: 30 tablet, Rfl: 0 .  cyclobenzaprine (FLEXERIL) 5 MG tablet, Take 1 tablet (5 mg total) by mouth 3 (three) times daily as needed for muscle spasms., Disp: 90 tablet, Rfl: 5 .  fluticasone (FLONASE) 50 MCG/ACT nasal spray, Place 2 sprays into both nostrils daily., Disp: 16 g, Rfl: 12 .  levothyroxine (SYNTHROID, LEVOTHROID) 25 MCG tablet, Take 1 tablet (25 mcg total) by mouth daily., Disp: 90 tablet, Rfl: 3 .  loratadine (CLARITIN) 10 MG tablet, Take 1 tablet (10 mg total) by mouth daily., Disp: 30 tablet, Rfl: 11 .  methylphenidate (RITALIN) 10 MG tablet, Take 1 tablet (10 mg total) by mouth 2 (two) times daily. 07/19/16, Disp: 60 tablet, Rfl: 0 .  Oxycodone HCl 10 MG TABS, Take 1 tablet (10 mg total) by mouth 4  (four) times daily as needed., Disp: 90 tablet, Rfl: 0 .  pantoprazole (PROTONIX) 40 MG tablet, TAKE 1 TABLET BY MOUTH EVERY DAY IN THE MORNING, Disp: 90 tablet, Rfl: 1 .  SUMAtriptan (IMITREX) 50 MG tablet, TAKE 1 TABLET (50 MG TOTAL) BY MOUTH ONCE. MAY REPEAT IN 2 HOURS IF HEADACHE PERSISTS OR RECURS., Disp: 9 tablet, Rfl: 1 .  vortioxetine HBr (TRINTELLIX) 20 MG TABS tablet, Take 1 tablet (20 mg total) by mouth daily., Disp: 90 tablet, Rfl: 12 .  amoxicillin-clavulanate (AUGMENTIN) 875-125 MG tablet, Take 1 tablet by mouth 2 (two) times daily. (Patient not taking: Reported on 10/03/2017), Disp: 20 tablet, Rfl: 0 .  Biotin 87564 MCG TABS, Take by mouth., Disp: , Rfl:  .  citalopram (CELEXA) 20 MG tablet, Take 1 tablet (20 mg total) by mouth daily. (Patient not taking: Reported on 12/26/2018), Disp: 30 tablet, Rfl: 11 .  doxycycline (VIBRA-TABS) 100 MG tablet, Take 1 tablet (100 mg total) by mouth 2 (two) times daily. (Patient not taking: Reported on 12/26/2018), Disp: 10 tablet, Rfl: 0 .  naloxegol oxalate (MOVANTIK) 25 MG TABS tablet, Take 1 tablet (25 mg total) by mouth daily. (Patient not taking: Reported on 12/26/2018), Disp: 30 tablet, Rfl: 5  Review of Systems  Constitutional: Negative for appetite change, chills, fatigue and fever.  Respiratory: Negative for chest tightness and shortness of breath.   Cardiovascular: Negative for chest pain  and palpitations.  Gastrointestinal: Negative for abdominal pain, nausea and vomiting.  Neurological: Negative for dizziness and weakness.    Social History   Tobacco Use  . Smoking status: Never Smoker  . Smokeless tobacco: Never Used  Substance Use Topics  . Alcohol use: No    Alcohol/week: 0.0 standard drinks      Objective:   BP 127/86 (BP Location: Left Arm, Patient Position: Sitting, Cuff Size: Large)   Pulse 88   Temp (!) 96.9 F (36.1 C) (Other (Comment))   Resp 16   Ht 5\' 6"  (1.676 m)   Wt 174 lb (78.9 kg)   SpO2 96%   BMI  28.08 kg/m  Vitals:   12/26/18 1019  BP: 127/86  Pulse: 88  Resp: 16  Temp: (!) 96.9 F (36.1 C)  TempSrc: Other (Comment)  SpO2: 96%  Weight: 174 lb (78.9 kg)  Height: 5\' 6"  (1.676 m)  Body mass index is 28.08 kg/m.   Physical Exam Vitals signs reviewed.  Constitutional:      Appearance: She is well-developed.  HENT:     Head: Normocephalic and atraumatic.     Right Ear: External ear normal.     Left Ear: External ear normal.     Nose: Nose normal.  Eyes:     General: No scleral icterus.    Conjunctiva/sclera: Conjunctivae normal.  Neck:     Thyroid: No thyromegaly.  Cardiovascular:     Rate and Rhythm: Normal rate and regular rhythm.     Heart sounds: Normal heart sounds.  Pulmonary:     Effort: Pulmonary effort is normal.  Abdominal:     Palpations: Abdomen is soft.  Skin:    General: Skin is warm and dry.  Neurological:     Mental Status: She is alert and oriented to person, place, and time.  Psychiatric:        Behavior: Behavior normal.        Thought Content: Thought content normal.      No results found for any visits on 12/26/18.     Assessment & Plan    1. Need for influenza vaccination  - Flu Vaccine QUAD 36+ mos IM  2. Chronic pain syndrome Patient advised to try to minimize narcotic.  May need referral back to pain clinic in the future. - oxyCODONE (ROXICODONE) 15 MG immediate release tablet; Take 1 tablet (15 mg total) by mouth every 6 (six) hours as needed for pain.  Dispense: 120 tablet; Refill: 0   - CBC w/Diff/Platelet - Lipid panel - TSH - Comprehensive Metabolic Panel (CMET)  3. Neuropathy   4. Anxiety  - CBC w/Diff/Platelet - Lipid panel - TSH - Comprehensive Metabolic Panel (CMET)  5. Hypercholesteremia  - CBC w/Diff/Platelet - Lipid panel - TSH - Comprehensive Metabolic Panel (CMET)  6. Adult hypothyroidism  - levothyroxine (SYNTHROID) 25 MCG tablet; Take 1 tablet (25 mcg total) by mouth daily.  Dispense: 90  tablet; Refill: 3  - CBC w/Diff/Platelet - Lipid panel - TSH - Comprehensive Metabolic Panel (CMET)  7. Gastroesophageal reflux disease without esophagitis  - CBC w/Diff/Platelet - Lipid panel - TSH - Comprehensive Metabolic Panel (CMET)  8. Depression More than 50% 25 minute visit spent in counseling or coordination of care May also need psychiatric referral in the future. - CBC w/Diff/Platelet - Lipid panel - TSH - Comprehensive Metabolic Panel (CMET)     Follow up in 3-4 months for CPE.    Wilhemena Durie, MD  [facility]

## 2018-12-26 ENCOUNTER — Ambulatory Visit (INDEPENDENT_AMBULATORY_CARE_PROVIDER_SITE_OTHER): Payer: Medicare HMO | Admitting: Family Medicine

## 2018-12-26 ENCOUNTER — Other Ambulatory Visit: Payer: Self-pay

## 2018-12-26 ENCOUNTER — Encounter: Payer: Self-pay | Admitting: Family Medicine

## 2018-12-26 VITALS — BP 127/86 | HR 88 | Temp 96.9°F | Resp 16 | Ht 66.0 in | Wt 174.0 lb

## 2018-12-26 DIAGNOSIS — E039 Hypothyroidism, unspecified: Secondary | ICD-10-CM | POA: Diagnosis not present

## 2018-12-26 DIAGNOSIS — G629 Polyneuropathy, unspecified: Secondary | ICD-10-CM

## 2018-12-26 DIAGNOSIS — G894 Chronic pain syndrome: Secondary | ICD-10-CM | POA: Diagnosis not present

## 2018-12-26 DIAGNOSIS — F330 Major depressive disorder, recurrent, mild: Secondary | ICD-10-CM

## 2018-12-26 DIAGNOSIS — K219 Gastro-esophageal reflux disease without esophagitis: Secondary | ICD-10-CM

## 2018-12-26 DIAGNOSIS — R69 Illness, unspecified: Secondary | ICD-10-CM | POA: Diagnosis not present

## 2018-12-26 DIAGNOSIS — Z23 Encounter for immunization: Secondary | ICD-10-CM | POA: Diagnosis not present

## 2018-12-26 DIAGNOSIS — E7800 Pure hypercholesterolemia, unspecified: Secondary | ICD-10-CM

## 2018-12-26 DIAGNOSIS — F419 Anxiety disorder, unspecified: Secondary | ICD-10-CM

## 2018-12-26 DIAGNOSIS — F329 Major depressive disorder, single episode, unspecified: Secondary | ICD-10-CM

## 2018-12-26 DIAGNOSIS — F32A Depression, unspecified: Secondary | ICD-10-CM

## 2018-12-26 MED ORDER — OXYCODONE HCL 15 MG PO TABS: 15.0000 mg | ORAL_TABLET | Freq: Four times a day (QID) | ORAL | 0 refills | Status: DC | PRN

## 2018-12-26 MED ORDER — LEVOTHYROXINE SODIUM 25 MCG PO TABS: 25.0000 ug | ORAL_TABLET | Freq: Every day | ORAL | 3 refills | Status: DC

## 2018-12-26 MED ORDER — PANTOPRAZOLE SODIUM 40 MG PO TBEC: DELAYED_RELEASE_TABLET | ORAL | 3 refills | Status: DC

## 2018-12-26 MED ORDER — SUMATRIPTAN SUCCINATE 50 MG PO TABS: 50.0000 mg | ORAL_TABLET | Freq: Once | ORAL | 5 refills | Status: DC

## 2018-12-26 MED ORDER — OXYCODONE HCL 10 MG PO TABS: 10.0000 mg | ORAL_TABLET | Freq: Four times a day (QID) | ORAL | 0 refills | Status: DC | PRN

## 2018-12-28 DIAGNOSIS — E7800 Pure hypercholesterolemia, unspecified: Secondary | ICD-10-CM | POA: Diagnosis not present

## 2018-12-28 DIAGNOSIS — R69 Illness, unspecified: Secondary | ICD-10-CM | POA: Diagnosis not present

## 2018-12-28 DIAGNOSIS — K219 Gastro-esophageal reflux disease without esophagitis: Secondary | ICD-10-CM | POA: Diagnosis not present

## 2018-12-28 DIAGNOSIS — E039 Hypothyroidism, unspecified: Secondary | ICD-10-CM | POA: Diagnosis not present

## 2018-12-29 LAB — COMPREHENSIVE METABOLIC PANEL
ALT: 10 IU/L (ref 0–32)
AST: 16 IU/L (ref 0–40)
Albumin/Globulin Ratio: 2 (ref 1.2–2.2)
Albumin: 4.3 g/dL (ref 3.8–4.8)
Alkaline Phosphatase: 87 IU/L (ref 39–117)
BUN/Creatinine Ratio: 8 — ABNORMAL LOW (ref 12–28)
BUN: 10 mg/dL (ref 8–27)
Bilirubin Total: 0.6 mg/dL (ref 0.0–1.2)
CO2: 20 mmol/L (ref 20–29)
Calcium: 9.4 mg/dL (ref 8.7–10.3)
Chloride: 105 mmol/L (ref 96–106)
Creatinine, Ser: 1.22 mg/dL — ABNORMAL HIGH (ref 0.57–1.00)
GFR calc Af Amer: 55 mL/min/{1.73_m2} — ABNORMAL LOW (ref >59)
GFR calc non Af Amer: 48 mL/min/{1.73_m2} — ABNORMAL LOW (ref >59)
Globulin, Total: 2.1 g/dL (ref 1.5–4.5)
Glucose: 96 mg/dL (ref 65–99)
Potassium: 4.4 mmol/L (ref 3.5–5.2)
Sodium: 140 mmol/L (ref 134–144)
Total Protein: 6.4 g/dL (ref 6.0–8.5)

## 2018-12-29 LAB — CBC WITH DIFFERENTIAL/PLATELET
Basophils Absolute: 0.1 10*3/uL (ref 0.0–0.2)
Basos: 1 %
EOS (ABSOLUTE): 0.2 10*3/uL (ref 0.0–0.4)
Eos: 5 %
Hematocrit: 39.8 % (ref 34.0–46.6)
Hemoglobin: 13.1 g/dL (ref 11.1–15.9)
Immature Grans (Abs): 0 10*3/uL (ref 0.0–0.1)
Immature Granulocytes: 0 %
Lymphocytes Absolute: 1.4 10*3/uL (ref 0.7–3.1)
Lymphs: 31 %
MCH: 29.2 pg (ref 26.6–33.0)
MCHC: 32.9 g/dL (ref 31.5–35.7)
MCV: 89 fL (ref 79–97)
Monocytes Absolute: 0.3 10*3/uL (ref 0.1–0.9)
Monocytes: 7 %
Neutrophils Absolute: 2.6 10*3/uL (ref 1.4–7.0)
Neutrophils: 56 %
Platelets: 242 10*3/uL (ref 150–450)
RBC: 4.49 x10E6/uL (ref 3.77–5.28)
RDW: 13.2 % (ref 11.7–15.4)
WBC: 4.7 10*3/uL (ref 3.4–10.8)

## 2018-12-29 LAB — LIPID PANEL
Chol/HDL Ratio: 4.3 ratio (ref 0.0–4.4)
Cholesterol, Total: 224 mg/dL — ABNORMAL HIGH (ref 100–199)
HDL: 52 mg/dL (ref >39)
LDL Chol Calc (NIH): 155 mg/dL — ABNORMAL HIGH (ref 0–99)
Triglycerides: 93 mg/dL (ref 0–149)
VLDL Cholesterol Cal: 17 mg/dL (ref 5–40)

## 2018-12-29 LAB — TSH: TSH: 1.96 u[IU]/mL (ref 0.450–4.500)

## 2018-12-31 ENCOUNTER — Telehealth: Payer: Self-pay

## 2018-12-31 NOTE — Telephone Encounter (Signed)
Tried calling patient. No answer or voice message system to leave a message. Will try calling back at a later time.

## 2018-12-31 NOTE — Telephone Encounter (Signed)
Pt called back requesting call back from nurses Best contact: [phone]

## 2018-12-31 NOTE — Telephone Encounter (Signed)
-----   Message from Jerrol Banana., MD sent at 12/29/2018  9:49 AM EST ----- Labs ok--to protect kidneys would push fluids a little and avoid NSAIDs

## 2019-01-01 NOTE — Telephone Encounter (Signed)
Pt calling to check status. Please advise  °

## 2019-01-01 NOTE — Telephone Encounter (Signed)
Pt advised of lab results.   Thanks,   -Laura  

## 2019-01-10 DIAGNOSIS — R69 Illness, unspecified: Secondary | ICD-10-CM | POA: Diagnosis not present

## 2019-01-15 ENCOUNTER — Other Ambulatory Visit: Payer: Self-pay | Admitting: Family Medicine

## 2019-01-15 DIAGNOSIS — F329 Major depressive disorder, single episode, unspecified: Secondary | ICD-10-CM

## 2019-01-15 NOTE — Telephone Encounter (Signed)
Medication Refill - Medication:  methylphenidate (RITALIN) 10 MG tablet [[id number]]   Has the patient contacted their pharmacy? No. (Agent: If no, request that the patient contact the pharmacy for the refill.) (Agent: If yes, when and what did the pharmacy advise?)  Preferred Pharmacy (with phone number or street name): [id number] [address], [place] [phone] (Phone)     Agent: Please be advised that RX refills may take up to 3 business days. We ask that you follow-up with your pharmacy.

## 2019-01-15 NOTE — Telephone Encounter (Signed)
Requested medication (s) are due for refill today: yes  Requested medication (s) are on the active medication list: yes  Last refill: 11/21/2018  Future visit scheduled: yes  Notes to clinic:  Not delegated    Requested Prescriptions  Pending Prescriptions Disp Refills   methylphenidate (RITALIN) 10 MG tablet 60 tablet 0    Sig: Take 1 tablet (10 mg total) by mouth 2 (two) times daily. 07/19/16     Not Delegated - Psychiatry:  Stimulants/ADHD Failed - 01/15/2019  2:01 PM      Failed - This refill cannot be delegated      Failed - Urine Drug Screen completed in last 360 days.      Passed - Valid encounter within last 3 months    Recent Outpatient Visits          2 weeks ago Hannah Vance., MD   11 months ago Right flank pain   [facility] Jerrol Vance., MD   1 year ago Adult hypothyroidism   [facility] Jerrol Vance., MD   1 year ago Current episode of major depressive disorder without prior episode, unspecified depression episode severity   [facility] Jerrol Vance., MD   1 year ago Pansinusitis, unspecified chronicity   [facility] Jerrol Vance., MD

## 2019-01-17 MED ORDER — METHYLPHENIDATE HCL 10 MG PO TABS: 10.0000 mg | ORAL_TABLET | Freq: Two times a day (BID) | ORAL | 0 refills | Status: DC

## 2019-01-29 ENCOUNTER — Other Ambulatory Visit: Payer: Self-pay | Admitting: Family Medicine

## 2019-01-29 NOTE — Telephone Encounter (Signed)
Requested medication (s) are due for refill today: yes  Requested medication (s) are on the active medication list: yes  Last refill:  12/21/2018  Future visit scheduled: no  Notes to clinic:  This refill cannot be delegated    Requested Prescriptions  Pending Prescriptions Disp Refills   clonazePAM (KLONOPIN) 0.5 MG tablet [Pharmacy Med Name: CLONAZEPAM 0.5 MG TABLET] 30 tablet 0    Sig: Take 1 tablet (0.5 mg total) by mouth at bedtime as needed.      Not Delegated - Psychiatry:  Anxiolytics/Hypnotics Failed - 01/29/2019  8:51 AM      Failed - This refill cannot be delegated      Failed - Urine Drug Screen completed in last 360 days.      Passed - Valid encounter within last 6 months    Recent Outpatient Visits           1 month ago [place] Jerrol Banana., MD   11 months ago Right flank pain   [facility] Jerrol Banana., MD   1 year ago Adult hypothyroidism   [facility] Jerrol Banana., MD   1 year ago Current episode of major depressive disorder without prior episode, unspecified depression episode severity   [facility] Jerrol Banana., MD   1 year ago Pansinusitis, unspecified chronicity   [facility] Jerrol Banana., MD

## 2019-02-04 ENCOUNTER — Telehealth: Payer: Self-pay | Admitting: Family Medicine

## 2019-02-04 DIAGNOSIS — R109 Unspecified abdominal pain: Secondary | ICD-10-CM

## 2019-02-04 NOTE — Telephone Encounter (Signed)
Patient called and would like to know if Dr. Rosanna Randy can call her in antibiotic for urine smell due to she thinks its related to kidney infection. Patient is having left side pain. She would also like to speak with him or his CMA. Please call patient back, thanks.

## 2019-02-04 NOTE — Telephone Encounter (Signed)
Collect urine for C and S.  thanks

## 2019-02-04 NOTE — Telephone Encounter (Signed)
Please advise 

## 2019-02-05 MED ORDER — NITROFURANTOIN MONOHYD MACRO 100 MG PO CAPS: 100.0000 mg | ORAL_CAPSULE | Freq: Two times a day (BID) | ORAL | 0 refills | Status: AC

## 2019-02-05 NOTE — Telephone Encounter (Addendum)
Her prescription for the antibiotic has been sent to the pharmacy and have notified the patient.

## 2019-02-05 NOTE — Telephone Encounter (Signed)
Patient called back and is having left flank pain and is worried about an infection since she has been receiving internal injections and the nurse said it was a common side effect of those injections to develop one. She is unable to come into the lab to give a urine sample now due to her son having Covid symptoms with Cough and Fever and is staying at her house. She stated she has also developed a dry cough. She was informed that she should stay home and monitor her symptoms and in about 4-5 days if her symptoms have not improved or worsened she would need to go for Covid Testing. She would like to know if an antibiotic could be called in to her pharmacy.

## 2019-02-05 NOTE — Telephone Encounter (Signed)
Called and LVM for patient to call back . A crm has been created to inform patient if she calls back to give a urine sample at the clinic for testing and if she is in extreme pain to be seen at an urgent care or the ED.

## 2019-02-05 NOTE — Addendum Note (Signed)
Addended by: Judie Petit on: 02/05/2019 09:18 AM   Modules accepted: Orders

## 2019-02-05 NOTE — Telephone Encounter (Signed)
Nitrofurantoin 100 mg twice a day for 5 days

## 2019-03-08 DIAGNOSIS — N952 Postmenopausal atrophic vaginitis: Secondary | ICD-10-CM | POA: Diagnosis not present

## 2019-03-08 DIAGNOSIS — R102 Pelvic and perineal pain: Secondary | ICD-10-CM | POA: Diagnosis not present

## 2019-03-08 DIAGNOSIS — R42 Dizziness and giddiness: Secondary | ICD-10-CM | POA: Diagnosis not present

## 2019-03-08 DIAGNOSIS — G8929 Other chronic pain: Secondary | ICD-10-CM | POA: Diagnosis not present

## 2019-03-25 ENCOUNTER — Other Ambulatory Visit: Payer: Self-pay | Admitting: Family Medicine

## 2019-03-25 DIAGNOSIS — F329 Major depressive disorder, single episode, unspecified: Secondary | ICD-10-CM

## 2019-03-25 DIAGNOSIS — F32A Depression, unspecified: Secondary | ICD-10-CM

## 2019-03-25 NOTE — Telephone Encounter (Signed)
Spoke with pharmacist, refills available, "Filling now."

## 2019-03-25 NOTE — Telephone Encounter (Signed)
L.O.V. was on 12/26/2018 and upcoming appointment on 03/28/2019. A refilled request was send for Ritalin.

## 2019-03-25 NOTE — Telephone Encounter (Signed)
[facility] for [facility] following medications:   methylphenidate (RITALIN)   Please advise.  Thanks, [employer]

## 2019-03-26 MED ORDER — METHYLPHENIDATE HCL 10 MG PO TABS: 10.0000 mg | ORAL_TABLET | Freq: Two times a day (BID) | ORAL | 0 refills | Status: DC

## 2019-03-28 ENCOUNTER — Encounter: Payer: Self-pay | Admitting: Family Medicine

## 2019-04-02 ENCOUNTER — Telehealth: Payer: Self-pay

## 2019-04-02 NOTE — Telephone Encounter (Signed)
Copied from CRM [id number]. Topic: General - Other >> Apr 02, 2019 11:10 AM Marylen Ponto wrote: Reason for CRM: Pt stated she would like to bring in a specimen to check her kidney function. Pt requests call back. Cb# [phone]

## 2019-04-02 NOTE — Telephone Encounter (Signed)
Returned call to patient and scheduled an ov for 04/04/2019.

## 2019-04-03 NOTE — Progress Notes (Signed)
Patient: Hannah Vance Female    DOB: 07-06-1955   64 y.o.   MRN: [id number] Visit Date: 04/04/2019  Today's Provider: Wilhemena Durie, MD   Chief Complaint  Patient presents with  . Urinary Tract Infection   Subjective:    I Sulibeya S. Dimas, CMA, am acting as scribe for Wilhemena Durie, MD.  HPI Urinary Tract Infection: Patient complains of bilateral flank pain, suprapubic pressure and urgency She has had symptoms for 5 days. Patient also complains of back pain. Patient denies fever, stomach ache and vaginal discharge. Patient does not have a history of recurrent UTI.  Patient does not have a history of pyelonephritis.   Allergies  Allergen Reactions  . Amoxicillin Other (See Comments)  . Morphine Other (See Comments)    Trouble walking, heavy legs  . Septra  [Sulfamethoxazole-Trimethoprim]   . Sulfa Antibiotics Other (See Comments)    burning  . Methadone Nausea And Vomiting     Current Outpatient Medications:  .  aspirin 81 MG EC tablet, Take by mouth., Disp: , Rfl:  .  clonazePAM (KLONOPIN) 0.5 MG tablet, TAKE 1 TABLET (0.5 MG TOTAL) BY MOUTH AT BEDTIME AS NEEDED., Disp: 30 tablet, Rfl: 3 .  cyclobenzaprine (FLEXERIL) 5 MG tablet, Take 1 tablet (5 mg total) by mouth 3 (three) times daily as needed for muscle spasms., Disp: 90 tablet, Rfl: 5 .  fluticasone (FLONASE) 50 MCG/ACT nasal spray, Place 2 sprays into both nostrils daily., Disp: 16 g, Rfl: 12 .  Green Tea, Camellia sinensis, (GREEN TEA PO), Take by mouth., Disp: , Rfl:  .  levothyroxine (SYNTHROID) 25 MCG tablet, Take 1 tablet (25 mcg total) by mouth daily., Disp: 90 tablet, Rfl: 3 .  loratadine (CLARITIN) 10 MG tablet, Take 1 tablet (10 mg total) by mouth daily., Disp: 30 tablet, Rfl: 11 .  methylphenidate (RITALIN) 10 MG tablet, Take 1 tablet (10 mg total) by mouth 2 (two) times daily. 07/19/16, Disp: 60 tablet, Rfl: 0 .  oxyCODONE (ROXICODONE) 15 MG immediate release tablet, Take 1 tablet (15 mg  total) by mouth every 6 (six) hours as needed for pain., Disp: 120 tablet, Rfl: 0 .  Oxycodone HCl 10 MG TABS, Take 1 tablet (10 mg total) by mouth 4 (four) times daily as needed., Disp: 90 tablet, Rfl: 0 .  pantoprazole (PROTONIX) 40 MG tablet, TAKE 1 TABLET BY MOUTH EVERY DAY IN THE MORNING, Disp: 90 tablet, Rfl: 3 .  vortioxetine HBr (TRINTELLIX) 20 MG TABS tablet, Take 1 tablet (20 mg total) by mouth daily., Disp: 90 tablet, Rfl: 12 .  citalopram (CELEXA) 20 MG tablet, Take 1 tablet (20 mg total) by mouth daily. (Patient not taking: Reported on 12/26/2018), Disp: 30 tablet, Rfl: 11 .  naloxegol oxalate (MOVANTIK) 25 MG TABS tablet, Take 1 tablet (25 mg total) by mouth daily. (Patient not taking: Reported on 12/26/2018), Disp: 30 tablet, Rfl: 5 .  SUMAtriptan (IMITREX) 50 MG tablet, Take 1 tablet (50 mg total) by mouth once for 1 dose. May repeat in 2 hours if headache persists or recurs., Disp: 9 tablet, Rfl: 5  Review of Systems  Constitutional: Negative for appetite change, chills, fatigue and fever.  HENT: Positive for tinnitus.   Eyes: Negative.   Respiratory: Negative for chest tightness and shortness of breath.   Cardiovascular: Negative for chest pain and palpitations.  Gastrointestinal: Negative for abdominal pain, nausea and vomiting.  Genitourinary: Positive for flank pain, frequency and urgency. Negative  for hematuria.  Musculoskeletal: Positive for back pain.  Allergic/Immunologic: Negative.   Neurological: Negative for dizziness and weakness.  Hematological: Negative.   Psychiatric/Behavioral: The patient is nervous/anxious.     Social History   Tobacco Use  . Smoking status: Never Smoker  . Smokeless tobacco: Never Used  Substance Use Topics  . Alcohol use: No    Alcohol/week: 0.0 standard drinks      Objective:   BP 107/71 (BP Location: Left Arm, Patient Position: Sitting, Cuff Size: Normal)   Pulse 98   Temp (!) 97.1 F (36.2 C) (Temporal)   Resp 16   Wt  170 lb (77.1 kg)   BMI 27.44 kg/m  Vitals:   04/04/19 1145  BP: 107/71  Pulse: 98  Resp: 16  Temp: (!) 97.1 F (36.2 C)  TempSrc: Temporal  Weight: 170 lb (77.1 kg)  Body mass index is 27.44 kg/m.   Physical Exam Vitals reviewed.  Constitutional:      Appearance: She is well-developed.  HENT:     Head: Normocephalic and atraumatic.     Right Ear: External ear normal.     Left Ear: External ear normal.     Nose: Nose normal.  Eyes:     General: No scleral icterus.    Conjunctiva/sclera: Conjunctivae normal.  Neck:     Thyroid: No thyromegaly.  Cardiovascular:     Rate and Rhythm: Normal rate and regular rhythm.     Heart sounds: Normal heart sounds.  Pulmonary:     Effort: Pulmonary effort is normal.  Abdominal:     Palpations: Abdomen is soft.     Comments: No abdominal tenderness or CVAT.  Skin:    General: Skin is warm and dry.  Neurological:     Mental Status: She is alert and oriented to person, place, and time. Mental status is at baseline.  Psychiatric:        Mood and Affect: Mood normal.        Behavior: Behavior normal.        Thought Content: Thought content normal.        Judgment: Judgment normal.      Results for orders placed or performed in visit on 04/04/19  POCT urinalysis dipstick  Result Value Ref Range   Color, UA yellow    Clarity, UA clear    Glucose, UA Negative Negative   Bilirubin, UA Negative    Ketones, UA Negative    Spec Grav, UA 1.020 1.010 - 1.025   Blood, UA Negative    pH, UA 7.0 5.0 - 8.0   Protein, UA Negative Negative   Urobilinogen, UA 0.2 0.2 or 1.0 E.U./dL   Nitrite, UA Negative    Leukocytes, UA Negative Negative       Assessment & Plan    1. Flank pain, acute Clinically I am not finding anything but will resume possible infection pending culture.  May need renal ultrasound. - POCT urinalysis dipstick - nitrofurantoin, macrocrystal-monohydrate, (MACROBID) 100 MG capsule; Take 1 capsule (100 mg total) by  mouth 2 (two) times daily.  Dispense: 10 capsule; Refill: 1 - CBC with Differential/Platelet - Comprehensive metabolic panel - Urine Culture  2. Tinnitus, bilateral No dizziness with this.  Likely hearing loss early on in the 64 year old. - Ambulatory referral to ENT  3. Chronic kidney disease, unspecified CKD stage Patient advised to drink more water and avoid anti-inflammatories.  Last GFR 48 - CBC with Differential/Platelet - Comprehensive metabolic panel  4. Lower abdominal  pain  - Urine Culture  5. MDD (major depressive disorder), recurrent episode, mild (HCC) In partial remission.  Anxiety is a major issue for this patient.  She has a lot of family conflicts.   - vortioxetine HBr (TRINTELLIX) 20 MG TABS tablet; Take 1 tablet (20 mg total) by mouth daily.  Dispense: 90 tablet; Refill: 12     Richard Wendelyn Breslow, MD  [facility]

## 2019-04-04 ENCOUNTER — Ambulatory Visit (INDEPENDENT_AMBULATORY_CARE_PROVIDER_SITE_OTHER): Payer: Medicare HMO | Admitting: Family Medicine

## 2019-04-04 ENCOUNTER — Encounter: Payer: Self-pay | Admitting: Family Medicine

## 2019-04-04 ENCOUNTER — Other Ambulatory Visit: Payer: Self-pay

## 2019-04-04 VITALS — BP 107/71 | HR 98 | Temp 97.1°F | Resp 16 | Wt 170.0 lb

## 2019-04-04 DIAGNOSIS — N189 Chronic kidney disease, unspecified: Secondary | ICD-10-CM

## 2019-04-04 DIAGNOSIS — H9313 Tinnitus, bilateral: Secondary | ICD-10-CM | POA: Diagnosis not present

## 2019-04-04 DIAGNOSIS — R1030 Lower abdominal pain, unspecified: Secondary | ICD-10-CM | POA: Diagnosis not present

## 2019-04-04 DIAGNOSIS — F330 Major depressive disorder, recurrent, mild: Secondary | ICD-10-CM

## 2019-04-04 DIAGNOSIS — R69 Illness, unspecified: Secondary | ICD-10-CM | POA: Diagnosis not present

## 2019-04-04 DIAGNOSIS — R109 Unspecified abdominal pain: Secondary | ICD-10-CM | POA: Diagnosis not present

## 2019-04-04 LAB — POCT URINALYSIS DIPSTICK
Bilirubin, UA: NEGATIVE
Blood, UA: NEGATIVE
Glucose, UA: NEGATIVE
Ketones, UA: NEGATIVE
Leukocytes, UA: NEGATIVE
Nitrite, UA: NEGATIVE
Protein, UA: NEGATIVE
Spec Grav, UA: 1.02 (ref 1.010–1.025)
Urobilinogen, UA: 0.2 E.U./dL
pH, UA: 7 (ref 5.0–8.0)

## 2019-04-04 MED ORDER — NITROFURANTOIN MONOHYD MACRO 100 MG PO CAPS: 100.0000 mg | ORAL_CAPSULE | Freq: Two times a day (BID) | ORAL | 1 refills | Status: DC

## 2019-04-04 MED ORDER — VORTIOXETINE HBR 20 MG PO TABS: 20.0000 mg | ORAL_TABLET | Freq: Every day | ORAL | 12 refills | Status: DC

## 2019-04-05 LAB — COMPREHENSIVE METABOLIC PANEL
ALT: 11 IU/L (ref 0–32)
AST: 20 IU/L (ref 0–40)
Albumin/Globulin Ratio: 1.6 (ref 1.2–2.2)
Albumin: 4.1 g/dL (ref 3.8–4.8)
Alkaline Phosphatase: 84 IU/L (ref 39–117)
BUN/Creatinine Ratio: 8 — ABNORMAL LOW (ref 12–28)
BUN: 10 mg/dL (ref 8–27)
Bilirubin Total: 0.3 mg/dL (ref 0.0–1.2)
CO2: 23 mmol/L (ref 20–29)
Calcium: 9.4 mg/dL (ref 8.7–10.3)
Chloride: 102 mmol/L (ref 96–106)
Creatinine, Ser: 1.2 mg/dL — ABNORMAL HIGH (ref 0.57–1.00)
GFR calc Af Amer: 56 mL/min/{1.73_m2} — ABNORMAL LOW (ref >59)
GFR calc non Af Amer: 49 mL/min/{1.73_m2} — ABNORMAL LOW (ref >59)
Globulin, Total: 2.5 g/dL (ref 1.5–4.5)
Glucose: 85 mg/dL (ref 65–99)
Potassium: 4.5 mmol/L (ref 3.5–5.2)
Sodium: 140 mmol/L (ref 134–144)
Total Protein: 6.6 g/dL (ref 6.0–8.5)

## 2019-04-05 LAB — CBC WITH DIFFERENTIAL/PLATELET
Basophils Absolute: 0.1 10*3/uL (ref 0.0–0.2)
Basos: 1 %
EOS (ABSOLUTE): 0.2 10*3/uL (ref 0.0–0.4)
Eos: 3 %
Hematocrit: 38.5 % (ref 34.0–46.6)
Hemoglobin: 13.3 g/dL (ref 11.1–15.9)
Immature Grans (Abs): 0 10*3/uL (ref 0.0–0.1)
Immature Granulocytes: 0 %
Lymphocytes Absolute: 1.9 10*3/uL (ref 0.7–3.1)
Lymphs: 31 %
MCH: 30.4 pg (ref 26.6–33.0)
MCHC: 34.5 g/dL (ref 31.5–35.7)
MCV: 88 fL (ref 79–97)
Monocytes Absolute: 0.4 10*3/uL (ref 0.1–0.9)
Monocytes: 7 %
Neutrophils Absolute: 3.5 10*3/uL (ref 1.4–7.0)
Neutrophils: 58 %
Platelets: 316 10*3/uL (ref 150–450)
RBC: 4.37 x10E6/uL (ref 3.77–5.28)
RDW: 12.6 % (ref 11.7–15.4)
WBC: 6.1 10*3/uL (ref 3.4–10.8)

## 2019-04-06 LAB — URINE CULTURE

## 2019-04-08 ENCOUNTER — Telehealth: Payer: Self-pay

## 2019-04-08 NOTE — Telephone Encounter (Signed)
Patient advised.

## 2019-04-08 NOTE — Telephone Encounter (Signed)
-----   Message from Maple Hudson., MD sent at 04/06/2019  7:57 AM EST ----- UA negative.

## 2019-04-17 ENCOUNTER — Telehealth: Payer: Self-pay | Admitting: Family Medicine

## 2019-04-17 NOTE — Telephone Encounter (Signed)
Pt stated she left paperwork for [employer] that helps her get her Trintellix a few weeks back however when it was sent in sections 2 and 3 were not filled out by PCP Stated there is a website where another copy can be printed out. Sections 2 and 3 need to be filled out by Dr. Sullivan Lone and faxed back with a cover sheet to [phone] [email or web address]  Pt is almost out of the medication.

## 2019-04-19 NOTE — Telephone Encounter (Signed)
Please advise form. 

## 2019-04-22 NOTE — Telephone Encounter (Signed)
Pt called to follow up on [employer] paperwork. She is going to drop off paperwork that has to go with paperwork. She does not have another copy of application and would like to know if someone at practice print out a copy from [email or web address] Please follow up with patient.

## 2019-04-22 NOTE — Telephone Encounter (Signed)
I do not have--maybe Raynelle Fanning can help with this.

## 2019-04-24 NOTE — Telephone Encounter (Signed)
Form has been given to provider to sign.

## 2019-04-24 NOTE — Progress Notes (Signed)
Patient: Hannah Vance, Female    DOB: October 24, 1955, 64 y.o.   MRN: [id number] Visit Date: 04/25/2019  Today's Provider: [name], MD   Chief Complaint  Patient presents with  . Annual Exam   Subjective:     Annual physical exam Hannah Vance is a 64 y.o. female who presents today for health maintenance and complete physical. She feels fairly well. She reports she is exercising. She reports she is sleeping fairly well. Pt s/p hysterectomy for uterine prolapse. She sees Gyn for chronic pelvis pain. -----------------------------------------------------------------  Mammogram: 07/23/2014  Review of Systems  Constitutional: Positive for appetite change and fatigue.  HENT: Positive for sneezing and tinnitus.   Eyes: Positive for photophobia.  Respiratory: Negative.   Cardiovascular: Positive for palpitations.  Gastrointestinal: Positive for abdominal pain.  Endocrine: Positive for polydipsia.  Genitourinary: Positive for urgency.  Musculoskeletal: Positive for arthralgias, back pain and neck pain.  Skin: Negative.   Allergic/Immunologic: Positive for environmental allergies.  Neurological: Positive for dizziness, numbness and headaches.  Hematological: Bruises/bleeds easily.  Psychiatric/Behavioral: Positive for decreased concentration and sleep disturbance. The patient is nervous/anxious.     Social History      She  reports that she has never smoked. She has never used smokeless tobacco. She reports that she does not drink alcohol or use drugs.       Social History   Socioeconomic History  . Marital status: Divorced    Spouse name: Not on file  . Number of children: Not on file  . Years of education: Not on file  . Highest education level: Not on file  Occupational History  . Not on file  Tobacco Use  . Smoking status: Never Smoker  . Smokeless tobacco: Never Used  Substance and Sexual Activity  . Alcohol use: No    Alcohol/week: 0.0 standard  drinks  . Drug use: No  . Sexual activity: Never  Other Topics Concern  . Not on file  Social History Narrative  . Not on file   Social Determinants of Health   Financial Resource Strain:   . Difficulty of Paying Living Expenses:   Food Insecurity:   . Worried About Programme researcher, broadcasting/film/video in the Last Year:   . Barista in the Last Year:   Transportation Needs:   . Freight forwarder (Medical):   Marland Kitchen Lack of Transportation (Non-Medical):   Physical Activity:   . Days of Exercise per Week:   . Minutes of Exercise per Session:   Stress:   . Feeling of Stress :   Social Connections:   . Frequency of Communication with Friends and Family:   . Frequency of Social Gatherings with Friends and Family:   . Attends Religious Services:   . Active Member of Clubs or Organizations:   . Attends Banker Meetings:   Marland Kitchen Marital Status:     No past medical history on file.   Patient Active Problem List   Diagnosis Date Noted  . Neuropathy 07/06/2016  . Fatigue 07/06/2016  . Pelvic floor dysfunction 03/24/2016  . Chronic pain syndrome 10/08/2015  . Chronic pelvic pain in female 07/01/2014  . Anxiety 06/23/2014  . Pain in joint 06/23/2014  . 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 Clinical depression 06/23/2014  . Hypercholesteremia 06/23/2014  . Adult hypothyroidism 06/23/2014  . Cannot sleep 06/23/2014  . Billowing mitral valve 06/23/2014  . Anancastic neurosis 06/23/2014  . Avitaminosis D  06/23/2014    Past Surgical History:  Procedure Laterality Date  . ABDOMINAL HYSTERECTOMY    . CERVICAL DISCECTOMY     C5-C6-fusion and plating in the neck  . TONSILLECTOMY      Family History        Family Status  Relation Name Status  . Mother  Alive  . Father  Alive  . Sister  Alive  . Brother  Alive  . Sister  Alive        Her family history includes Aneurysm in her father; Arthritis in her mother; Dementia in her father; Heart attack in her father;  Hypertension in her father; Polycystic kidney disease in her mother; Stroke in her mother.    Mother with Polycystic kidney disease.  Allergies  Allergen Reactions  . Amoxicillin Other (See Comments)  . Morphine Other (See Comments)    Trouble walking, heavy legs  . Septra  [Sulfamethoxazole-Trimethoprim]   . Sulfa Antibiotics Other (See Comments)    burning  . Methadone Nausea And Vomiting     Current Outpatient Medications:  .  aspirin 81 MG EC tablet, Take by mouth., Disp: , Rfl:  .  citalopram (CELEXA) 20 MG tablet, Take 1 tablet (20 mg total) by mouth daily. (Patient not taking: Reported on 12/26/2018), Disp: 30 tablet, Rfl: 11 .  clonazePAM (KLONOPIN) 0.5 MG tablet, TAKE 1 TABLET (0.5 MG TOTAL) BY MOUTH AT BEDTIME AS NEEDED., Disp: 30 tablet, Rfl: 3 .  cyclobenzaprine (FLEXERIL) 5 MG tablet, Take 1 tablet (5 mg total) by mouth 3 (three) times daily as needed for muscle spasms., Disp: 90 tablet, Rfl: 5 .  fluticasone (FLONASE) 50 MCG/ACT nasal spray, Place 2 sprays into both nostrils daily., Disp: 16 g, Rfl: 12 .  Green Tea, Camellia sinensis, (GREEN TEA PO), Take by mouth., Disp: , Rfl:  .  levothyroxine (SYNTHROID) 25 MCG tablet, Take 1 tablet (25 mcg total) by mouth daily., Disp: 90 tablet, Rfl: 3 .  loratadine (CLARITIN) 10 MG tablet, Take 1 tablet (10 mg total) by mouth daily., Disp: 30 tablet, Rfl: 11 .  methylphenidate (RITALIN) 10 MG tablet, Take 1 tablet (10 mg total) by mouth 2 (two) times daily. 07/19/16, Disp: 60 tablet, Rfl: 0 .  naloxegol oxalate (MOVANTIK) 25 MG TABS tablet, Take 1 tablet (25 mg total) by mouth daily. (Patient not taking: Reported on 12/26/2018), Disp: 30 tablet, Rfl: 5 .  nitrofurantoin, macrocrystal-monohydrate, (MACROBID) 100 MG capsule, Take 1 capsule (100 mg total) by mouth 2 (two) times daily., Disp: 10 capsule, Rfl: 1 .  oxyCODONE (ROXICODONE) 15 MG immediate release tablet, Take 1 tablet (15 mg total) by mouth every 6 (six) hours as needed for  pain., Disp: 120 tablet, Rfl: 0 .  Oxycodone HCl 10 MG TABS, Take 1 tablet (10 mg total) by mouth 4 (four) times daily as needed., Disp: 90 tablet, Rfl: 0 .  pantoprazole (PROTONIX) 40 MG tablet, TAKE 1 TABLET BY MOUTH EVERY DAY IN THE MORNING, Disp: 90 tablet, Rfl: 3 .  SUMAtriptan (IMITREX) 50 MG tablet, Take 1 tablet (50 mg total) by mouth once for 1 dose. May repeat in 2 hours if headache persists or recurs., Disp: 9 tablet, Rfl: 5 .  vortioxetine HBr (TRINTELLIX) 20 MG TABS tablet, Take 1 tablet (20 mg total) by mouth daily., Disp: 90 tablet, Rfl: 12   Patient Care Team: Jerrol Banana., MD as PCP - General (Family Medicine)    Objective:    Vitals: There were no vitals  taken for this visit.  There were no vitals filed for this visit.   Physical Exam Vitals reviewed.  Constitutional:      Appearance: She is well-developed.  HENT:     Head: Normocephalic and atraumatic.     Right Ear: External ear normal.     Left Ear: External ear normal.     Nose: Nose normal.  Eyes:     General: No scleral icterus.    Conjunctiva/sclera: Conjunctivae normal.  Neck:     Thyroid: No thyromegaly.     Vascular: No carotid bruit.  Cardiovascular:     Rate and Rhythm: Normal rate and regular rhythm.     Heart sounds: Normal heart sounds.  Pulmonary:     Effort: Pulmonary effort is normal.  Abdominal:     Palpations: Abdomen is soft.     Comments: No abdominal tenderness or CVAT.  Lymphadenopathy:     Cervical: No cervical adenopathy.  Skin:    General: Skin is warm and dry.  Neurological:     Mental Status: She is alert and oriented to person, place, and time. Mental status is at baseline.  Psychiatric:        Mood and Affect: Mood normal.        Behavior: Behavior normal.        Thought Content: Thought content normal.        Judgment: Judgment normal.      Depression Screen PHQ 2/9 Scores 04/04/2019 08/17/2016 07/06/2016 08/20/2014  PHQ - 2 Score 0 3 4 5   PHQ- 9 Score 8 9  15 19        Assessment & Plan:     Routine Health Maintenance and Physical Exam  Exercise Activities and Dietary recommendations Goals   None     Immunization History  Administered Date(s) Administered  . Influenza,inj,Quad PF,6+ Mos 11/03/2014, 11/30/2015, 12/26/2018  . Tdap 08/15/2016    Health Maintenance  Topic Date Due  . Hepatitis C Screening  Never done  . PNEUMOCOCCAL POLYSACCHARIDE VACCINE AGE 4-64 HIGH RISK  Never done  . FOOT EXAM  Never done  . OPHTHALMOLOGY EXAM  Never done  . URINE MICROALBUMIN  Never done  . HIV Screening  Never done  . COLONOSCOPY  Never done  . MAMMOGRAM  07/22/2016  . HEMOGLOBIN A1C  01/06/2017  . TETANUS/TDAP  08/16/2026  . INFLUENZA VACCINE  Completed     Discussed health benefits of physical activity, and encouraged her to engage in regular exercise appropriate for her age and condition.   1. Annual physical exam Breast/pelvic per Gyn.  2. Pelvic floor dysfunction Per Gyn.  3. Chronic pain syndrome  - cyclobenzaprine (FLEXERIL) 5 MG tablet; Take 1 tablet (5 mg total) by mouth 3 (three) times daily as needed for muscle spasms.  Dispense: 90 tablet; Refill: 5 - Oxycodone HCl 10 MG TABS; Take 1 tablet (10 mg total) by mouth 4 (four) times daily as needed.  Dispense: 90 tablet; Refill: 0  4. Chronic pelvic pain in female   5. Current episode of major depressive disorder without prior episode Probable lifelong meds. - methylphenidate (RITALIN) 10 MG tablet; Take 1 tablet (10 mg total) by mouth 2 (two) times daily. 07/19/16  Dispense: 60 tablet; Refill: 0  6. Current episode of major depressive disorder without prior episode, unspecified depression episode severity Recommend counseling for pt.  --------------------------------------------------------------------    10/17/2026, MD  [facility]

## 2019-04-25 ENCOUNTER — Ambulatory Visit (INDEPENDENT_AMBULATORY_CARE_PROVIDER_SITE_OTHER): Payer: Medicare HMO | Admitting: Family Medicine

## 2019-04-25 ENCOUNTER — Encounter: Payer: Self-pay | Admitting: Family Medicine

## 2019-04-25 ENCOUNTER — Other Ambulatory Visit: Payer: Self-pay

## 2019-04-25 VITALS — BP 120/83 | HR 78 | Temp 96.9°F | Wt 171.8 lb

## 2019-04-25 DIAGNOSIS — M6289 Other specified disorders of muscle: Secondary | ICD-10-CM

## 2019-04-25 DIAGNOSIS — Z0000 Encounter for general adult medical examination without abnormal findings: Secondary | ICD-10-CM | POA: Diagnosis not present

## 2019-04-25 DIAGNOSIS — R102 Pelvic and perineal pain: Secondary | ICD-10-CM

## 2019-04-25 DIAGNOSIS — R69 Illness, unspecified: Secondary | ICD-10-CM | POA: Diagnosis not present

## 2019-04-25 DIAGNOSIS — G8929 Other chronic pain: Secondary | ICD-10-CM | POA: Diagnosis not present

## 2019-04-25 DIAGNOSIS — F329 Major depressive disorder, single episode, unspecified: Secondary | ICD-10-CM

## 2019-04-25 DIAGNOSIS — G894 Chronic pain syndrome: Secondary | ICD-10-CM | POA: Diagnosis not present

## 2019-04-25 MED ORDER — OXYCODONE HCL 10 MG PO TABS: 10.0000 mg | ORAL_TABLET | Freq: Four times a day (QID) | ORAL | 0 refills | Status: DC | PRN

## 2019-04-25 MED ORDER — CYCLOBENZAPRINE HCL 5 MG PO TABS: 5.0000 mg | ORAL_TABLET | Freq: Three times a day (TID) | ORAL | 5 refills | Status: DC | PRN

## 2019-04-25 MED ORDER — METHYLPHENIDATE HCL 10 MG PO TABS: 10.0000 mg | ORAL_TABLET | Freq: Two times a day (BID) | ORAL | 0 refills | Status: DC

## 2019-05-20 DIAGNOSIS — H9319 Tinnitus, unspecified ear: Secondary | ICD-10-CM | POA: Diagnosis not present

## 2019-05-20 DIAGNOSIS — H9313 Tinnitus, bilateral: Secondary | ICD-10-CM | POA: Diagnosis not present

## 2019-05-20 DIAGNOSIS — R52 Pain, unspecified: Secondary | ICD-10-CM | POA: Diagnosis not present

## 2019-05-20 DIAGNOSIS — H9042 Sensorineural hearing loss, unilateral, left ear, with unrestricted hearing on the contralateral side: Secondary | ICD-10-CM | POA: Diagnosis not present

## 2019-05-21 ENCOUNTER — Telehealth: Payer: Self-pay

## 2019-05-21 NOTE — Telephone Encounter (Signed)
Information fax

## 2019-05-21 NOTE — Telephone Encounter (Signed)
Copied from CRM [id number]. Topic: General - Other >> May 21, 2019 10:05 AM Marylen Ponto wrote: Reason for CRM: Pt stated Dr. Madelon Lips has scheduled her for MRI on Friday 05/31/19 at [address] [facility] in [place] phone # [phone]. Pt stated she was asked to contact pcp office for most recent lab results and documentation of any metal objects/implants. Pt stated she has metal in her neck from a procedure she had about 10 years ago. Pt also stated the paperwork she received asked if she is claustrophobic and she is so she would like that to be documented. Pt requests call back

## 2019-05-31 DIAGNOSIS — H9312 Tinnitus, left ear: Secondary | ICD-10-CM | POA: Diagnosis not present

## 2019-05-31 DIAGNOSIS — I6782 Cerebral ischemia: Secondary | ICD-10-CM | POA: Diagnosis not present

## 2019-05-31 DIAGNOSIS — H9319 Tinnitus, unspecified ear: Secondary | ICD-10-CM | POA: Diagnosis not present

## 2019-06-18 NOTE — Progress Notes (Signed)
Established patient visit   Patient: Hannah Vance   DOB: [birth year]   64 y.o. Female  MRN: [id number] Visit Date: 06/19/2019  I,Sulibeya S Dimas,acting as a scribe for [name], MD.,have documented all relevant documentation on the behalf of [name], MD,as directed by  [name], MD while in the presence of [name], MD.  Today's healthcare provider: [name], MD   Chief Complaint  Patient presents with  . Depression   Subjective    HPI  Patient was seen by [facility] for tinnitus and had an MRI.  She is now more anxious because the ENT is sending the patient to [facility] neurology.  MRI shows some microvascular changes but appears to be benign reading otherwise.  Patient is not suicidal or homicidal and otherwise is feeling okay.  She continues to have some tinnitus and dizziness. Depression, Follow-up  She  was last seen for this 2 months ago. Changes made at last visit include no changes.   She reports excellent compliance with treatment. She is not having side effects.   She reports good tolerance of treatment. Current symptoms include: depressed mood, difficulty concentrating, fatigue, feelings of worthlessness/guilt, hopelessness and hypersomnia She feels she is Worse since last visit.  GAD 7 : Generalized Anxiety Score 06/19/2019  Nervous, Anxious, on Edge 3  Control/stop worrying 2  Worry too much - different things 2  Trouble relaxing 2  Restless 1  Easily annoyed or irritable 1  Afraid - awful might happen 1  Total GAD 7 Score 12  Anxiety Difficulty Very difficult    Depression screen [facility] 2/9 06/19/2019 04/25/2019 04/04/2019 08/17/2016 07/06/2016  Decreased Interest 3 0 0 1 2  Down, Depressed, Hopeless 3 1 0 2 2  PHQ - 2 Score 6 1 0 3 4  Altered sleeping 3 2 2 1 2   Tired, decreased energy 3 3 3 2 3   Change in appetite 2 1 1 1 2   Feeling bad or failure about yourself  1 1 0 1 2  Trouble concentrating 3 1 1 1 2     Moving slowly or fidgety/restless 2 0 1 0 0  Suicidal thoughts 0 0 0 0 0  PHQ-9 Score 20 9 8 9 15   Difficult doing work/chores Very difficult Somewhat difficult Somewhat difficult - Very difficult   Follow up for tinnitus  The patient was last seen for this 3 months ago. Changes made at last visit include referred to otolaryngology.  She reports excellent compliance with treatment. Patient reports that Dr. sent for MRI. Dr. reviewed MRI and has advised patient to see neurology. Patient reports this is making her anxiety worse. Patient reports she is working on getting financial help to see neurology. She feels that condition is Unchanged. She is not having side effects.   -----------------------------------------------------------------------------------------   Medications: Outpatient Medications Prior to Visit  Medication Sig  . aspirin 81 MG EC tablet Take by mouth.  . clonazePAM (KLONOPIN) 0.5 MG tablet TAKE 1 TABLET (0.5 MG TOTAL) BY MOUTH AT BEDTIME AS NEEDED.  cyclobenzaprine (FLEXERIL) 5 MG tablet Take 1 tablet (5 mg total) by mouth 3 (three) times daily as needed for muscle spasms.  . fluticasone (FLONASE) 50 MCG/ACT nasal spray Place 2 sprays into both nostrils daily.  Tea, Camellia sinensis, (GREEN TEA PO) Take by mouth.  . loratadine (CLARITIN) 10 MG tablet Take 1 tablet (10 mg total) by mouth daily.   methylphenidate (RITALIN) 10 MG tablet Take 1 tablet (10 mg total) by mouth 2 (two) times daily. 07/19/16  . Oxycodone HCl 10 MG TABS Take 1 tablet (10 mg total) by mouth 4 (four) times daily as needed.  . vortioxetine HBr (TRINTELLIX) 20 MG TABS tablet Take 1 tablet (20 mg total) by mouth daily.  . [DISCONTINUED] levothyroxine (SYNTHROID) 25 MCG tablet Take 1 tablet (25 mcg total) by mouth daily.  . SUMAtriptan (IMITREX) 50 MG tablet Take 1 tablet (50 mg total) by mouth once for 1 dose. May repeat in 2 hours if headache persists or recurs.  .  [DISCONTINUED] citalopram (CELEXA) 20 MG tablet Take 1 tablet (20 mg total) by mouth daily. (Patient not taking: Reported on 12/26/2018)  . [DISCONTINUED] naloxegol oxalate (MOVANTIK) 25 MG TABS tablet Take 1 tablet (25 mg total) by mouth daily. (Patient not taking: Reported on 06/19/2019)  . [DISCONTINUED] nitrofurantoin, macrocrystal-monohydrate, (MACROBID) 100 MG capsule Take 1 capsule (100 mg total) by mouth 2 (two) times daily. (Patient not taking: Reported on 04/25/2019)  . [DISCONTINUED] oxyCODONE (ROXICODONE) 15 MG immediate release tablet Take 1 tablet (15 mg total) by mouth every 6 (six) hours as needed for pain. (Patient not taking: Reported on 06/19/2019)  . [DISCONTINUED] pantoprazole (PROTONIX) 40 MG tablet TAKE 1 TABLET BY MOUTH EVERY DAY IN THE MORNING   No facility-administered medications prior to visit.    Review of Systems  Constitutional: Negative for appetite change, chills, fatigue and fever.  HENT: Positive for tinnitus.   Eyes: Negative.   Respiratory: Negative for chest tightness and shortness of breath.   Cardiovascular: Negative for chest pain and palpitations.  Gastrointestinal: Negative for abdominal pain, nausea and vomiting.  Endocrine: Negative.   Genitourinary: Positive for pelvic pain.  Musculoskeletal: Positive for back pain.  Allergic/Immunologic: Negative.   Neurological: Positive for dizziness. Negative for weakness.       Especially with movement of her head.  Hematological: Negative.   Psychiatric/Behavioral: Negative.      Objective    BP (!) 135/94   Pulse 85   Temp (!) 97.1 F (36.2 C) (Temporal)   Resp 16   Ht 5\' 2"  (1.575 m)   Wt 173 lb 3.2 oz (78.6 kg)   BMI 31.68 kg/m  BP Readings from Last 3 Encounters:  06/19/19 (!) 135/94  04/25/19 120/83  04/04/19 107/71   Wt Readings from Last 3 Encounters:  06/19/19 173 lb 3.2 oz (78.6 kg)  04/25/19 171 lb 12.8 oz (77.9 kg)  04/04/19 170 lb (77.1 kg)      Physical Exam Vitals  reviewed.  Constitutional:      General: She is in acute distress.     Appearance: Normal appearance. She is well-developed. She is not diaphoretic.  HENT:     Head: Normocephalic and atraumatic.  Eyes:     General: No scleral icterus.    Conjunctiva/sclera: Conjunctivae normal.  Neck:     Thyroid: No thyromegaly.  Cardiovascular:     Rate and Rhythm: Normal rate and regular rhythm.     Pulses: Normal pulses.     Heart sounds: Normal heart sounds. No murmur.  Pulmonary:     Effort: Pulmonary effort is normal. No respiratory distress.     Breath sounds: Normal breath sounds. No wheezing, rhonchi or rales.  Musculoskeletal:     Cervical back: Neck supple.     Right lower leg: No edema.     Left lower leg: No edema.  Lymphadenopathy:  Cervical: No cervical adenopathy.  Skin:    General: Skin is warm and dry.     Findings: No rash.  Neurological:     Mental Status: She is alert and oriented to person, place, and time. Mental status is at baseline.     No results found for any visits on 06/19/19.  Assessment & Plan     Problem List Items Addressed This Visit      Other   Anxiety - Primary    Uncontrolled Patient going through a lot stress Working on getting financial help for [facility] neurology Continue current medications Add Buspar 10 mg TID Follow up in 1 month Repeat GAD 7 PHQ 9      Relevant Medications   busPIRone (BUSPAR) 10 MG tablet   Chronic pain syndrome    Other Visit Diagnoses    Current episode of major depressive disorder without prior episode       Relevant Medications   busPIRone (BUSPAR) 10 MG tablet    Chronic tinnitus/dizziness [facility].  Appointment pending    Return in about 4 weeks (around 07/17/2019) for chronic disease f/u.      I, [name], MD, have reviewed all documentation for this visit. The documentation on 06/19/19 for the exam, diagnosis, procedures, and orders are all accurate and complete.    Shine Mikes  Wendelyn Breslow, MD  [facility] [phone] (phone) [phone] (fax)  [facility]

## 2019-06-19 ENCOUNTER — Ambulatory Visit (INDEPENDENT_AMBULATORY_CARE_PROVIDER_SITE_OTHER): Payer: Medicare HMO | Admitting: Family Medicine

## 2019-06-19 ENCOUNTER — Other Ambulatory Visit: Payer: Self-pay

## 2019-06-19 ENCOUNTER — Encounter: Payer: Self-pay | Admitting: Family Medicine

## 2019-06-19 VITALS — BP 135/94 | HR 85 | Temp 97.1°F | Resp 16 | Ht 62.0 in | Wt 173.2 lb

## 2019-06-19 DIAGNOSIS — G894 Chronic pain syndrome: Secondary | ICD-10-CM

## 2019-06-19 DIAGNOSIS — H9313 Tinnitus, bilateral: Secondary | ICD-10-CM

## 2019-06-19 DIAGNOSIS — R69 Illness, unspecified: Secondary | ICD-10-CM | POA: Diagnosis not present

## 2019-06-19 DIAGNOSIS — F419 Anxiety disorder, unspecified: Secondary | ICD-10-CM | POA: Diagnosis not present

## 2019-06-19 DIAGNOSIS — F329 Major depressive disorder, single episode, unspecified: Secondary | ICD-10-CM

## 2019-06-19 MED ORDER — BUSPIRONE HCL 10 MG PO TABS: 10.0000 mg | ORAL_TABLET | Freq: Three times a day (TID) | ORAL | 5 refills | Status: DC

## 2019-06-19 MED ORDER — LEVOTHYROXINE SODIUM 25 MCG PO TABS: 25.0000 ug | ORAL_TABLET | Freq: Every day | ORAL | 3 refills | Status: DC

## 2019-06-19 MED ORDER — OXYCODONE HCL 10 MG PO TABS: 10.0000 mg | ORAL_TABLET | Freq: Four times a day (QID) | ORAL | 0 refills | Status: DC | PRN

## 2019-06-19 MED ORDER — METHYLPHENIDATE HCL 10 MG PO TABS: 10.0000 mg | ORAL_TABLET | Freq: Two times a day (BID) | ORAL | 0 refills | Status: DC

## 2019-06-19 NOTE — Assessment & Plan Note (Signed)
Uncontrolled Patient going through a lot stress Working on getting financial help for [facility] neurology Continue current medications Add Buspar 10 mg TID Follow up in 1 month Repeat GAD 7 PHQ 9

## 2019-06-19 NOTE — Assessment & Plan Note (Addendum)
Uncontrolled Continue current medication Add Buspar 10 mg TID Follow up in 1 month

## 2019-07-16 ENCOUNTER — Other Ambulatory Visit: Payer: Self-pay | Admitting: Family Medicine

## 2019-07-16 DIAGNOSIS — F419 Anxiety disorder, unspecified: Secondary | ICD-10-CM

## 2019-07-16 NOTE — Telephone Encounter (Signed)
Requested medication (s) are due for refill today:  No  Requested medication (s) are on the active medication list:   Yes  Future visit scheduled:   Yes   Last ordered: 06/19/2019 #90 with 5 refills.  Clinic note:   Pharmacy requesting a DX Code also requesting a 90 day prescription.  Returned for provider review.   Requested Prescriptions  Pending Prescriptions Disp Refills   busPIRone (BUSPAR) 10 MG tablet [Pharmacy Med Name: BUSPIRONE HCL 10 MG TABLET] 270 tablet 2    Sig: TAKE 1 TABLET BY MOUTH THREE TIMES A DAY      Psychiatry: Anxiolytics/Hypnotics - Non-controlled Passed - 07/16/2019 12:31 PM      Passed - Valid encounter within last 6 months    Recent Outpatient Visits           3 weeks ago Anxiety   [facility] Maple Hudson., MD   2 months ago Annual physical exam   [facility] Maple Hudson., MD   3 months ago Flank pain, acute   [facility] Maple Hudson., MD   6 months ago Hypercholesteremia   [facility] Maple Hudson., MD   1 year ago Right flank pain   [facility] Maple Hudson., MD       Future Appointments             In 1 week Maple Hudson., MD [facility]   In 1 month Maple Hudson., MD [facility]

## 2019-07-18 NOTE — Progress Notes (Deleted)
     Established patient visit   Patient: Hannah Vance   DOB: Aug 21, 1955   64 y.o. Female  MRN: [id number] Visit Date: 07/23/2019  Today's healthcare provider: [name], MD   No chief complaint on file.  Subjective    HPI ***  {Show patient history (optional):23778::" "}   Medications: Outpatient Medications Prior to Visit  Medication Sig  . aspirin 81 MG EC tablet Take by mouth.  . busPIRone (BUSPAR) 10 MG tablet TAKE 1 TABLET BY MOUTH THREE TIMES A DAY  . clonazePAM (KLONOPIN) 0.5 MG tablet TAKE 1 TABLET (0.5 MG TOTAL) BY MOUTH AT BEDTIME AS NEEDED.  Marland Kitchen cyclobenzaprine (FLEXERIL) 5 MG tablet Take 1 tablet (5 mg total) by mouth 3 (three) times daily as needed for muscle spasms.  . fluticasone (FLONASE) 50 MCG/ACT nasal spray Place 2 sprays into both nostrils daily.  Chilton Si Tea, Camellia sinensis, (GREEN TEA PO) Take by mouth.  . loratadine (CLARITIN) 10 MG tablet Take 1 tablet (10 mg total) by mouth daily.  . methylphenidate (RITALIN) 10 MG tablet Take 1 tablet (10 mg total) by mouth 2 (two) times daily. 07/19/16  . Oxycodone HCl 10 MG TABS Take 1 tablet (10 mg total) by mouth 4 (four) times daily as needed.  . SUMAtriptan (IMITREX) 50 MG tablet Take 1 tablet (50 mg total) by mouth once for 1 dose. May repeat in 2 hours if headache persists or recurs.  . vortioxetine HBr (TRINTELLIX) 20 MG TABS tablet Take 1 tablet (20 mg total) by mouth daily.   No facility-administered medications prior to visit.    Review of Systems  Constitutional: Negative for appetite change, chills, fatigue and fever.  Respiratory: Negative for chest tightness and shortness of breath.   Cardiovascular: Negative for chest pain and palpitations.  Gastrointestinal: Negative for abdominal pain, nausea and vomiting.  Neurological: Negative for dizziness and weakness.    {Heme  Chem  Endocrine  Serology  Results Review (optional):23779::" "}  Objective    There were no vitals taken  for this visit. {Show previous vital signs (optional):23777::" "}  Physical Exam  ***  No results found for any visits on 07/23/19.  Assessment & Plan     ***  No follow-ups on file.      {provider attestation***:1}   [name], MD  [facility] [phone] (phone) [phone] (fax)  [facility]

## 2019-07-23 ENCOUNTER — Ambulatory Visit: Payer: Self-pay | Admitting: Family Medicine

## 2019-07-31 NOTE — Progress Notes (Signed)
Hannah Vance,acting as a scribe for Wilhemena Durie, MD.,have documented all relevant documentation on the behalf of Wilhemena Durie, MD,as directed by  Wilhemena Durie, MD while in the presence of Wilhemena Durie, MD.  Established patient visit   Patient: Hannah Vance   DOB: 01-09-1956   64 y.o. Female  MRN: [id number] Visit Date: 08/01/2019  Today's healthcare provider: Wilhemena Durie, MD   Chief Complaint  Patient presents with  . Anxiety   Subjective    HPI   Patient says that she feels like she needs an inhaler she said that she has had some congestion and she took mucinex to help with that and then switched back to Claritin.  Patient is only taking buspar BID because she said that taking it  3x daily has been making her sleepy.  It is helping a lot with her anxiety. Anxiety, Follow-up  She was last seen for anxiety 1 months ago. Changes made at last visit include adding buspar 10mg  TID, along with current meds.   She reports good compliance with treatment. She reports good tolerance of treatment. She is having side effects. Drowsiness  But only with the third dose.  With the first and second dose that she does fine. She feels her anxiety is mild and Improved since last visit.  Symptoms: No chest pain No difficulty concentrating  Yes dizziness Yes fatigue  No feelings of losing control No insomnia  No irritable No palpitations  No panic attacks No racing thoughts  No shortness of breath Yes sweating  No tremors/shakes    GAD-7 Results GAD-7 Generalized Anxiety Disorder Screening Tool 06/19/2019  1. Feeling Nervous, Anxious, or on Edge 3  2. Not Being Able to Stop or Control Worrying 2  3. Worrying Too Much About Different Things 2  4. Trouble Relaxing 2  5. Being So Restless it's Hard To Sit Still 1  6. Becoming Easily Annoyed or Irritable 1  7. Feeling Afraid As If Something Awful Might Happen 1  Total GAD-7 Score 12  Difficulty At  Work, Home, or Getting  Along With Others? Very difficult    PHQ-9 Scores PHQ9 SCORE ONLY 06/19/2019 04/25/2019 04/04/2019  PHQ-9 Total Score 20 9 8     ---------------------------------------------------------------------------------------------------  Social History   Tobacco Use  . Smoking status: Never Smoker  . Smokeless tobacco: Never Used  Substance Use Topics  . Alcohol use: No    Alcohol/week: 0.0 standard drinks  . Drug use: No       Medications: Outpatient Medications Prior to Visit  Medication Sig  . aspirin 81 MG EC tablet Take by mouth.  . busPIRone (BUSPAR) 10 MG tablet TAKE 1 TABLET BY MOUTH THREE TIMES A DAY  . clonazePAM (KLONOPIN) 0.5 MG tablet TAKE 1 TABLET (0.5 MG TOTAL) BY MOUTH AT BEDTIME AS NEEDED.  Marland Kitchen cyclobenzaprine (FLEXERIL) 5 MG tablet Take 1 tablet (5 mg total) by mouth 3 (three) times daily as needed for muscle spasms.  . fluticasone (FLONASE) 50 MCG/ACT nasal spray Place 2 sprays into both nostrils daily.  Nyoka Cowden Tea, Camellia sinensis, (GREEN TEA PO) Take by mouth.  . loratadine (CLARITIN) 10 MG tablet Take 1 tablet (10 mg total) by mouth daily.  . methylphenidate (RITALIN) 10 MG tablet Take 1 tablet (10 mg total) by mouth 2 (two) times daily. 07/19/16  . Oxycodone HCl 10 MG TABS Take 1 tablet (10 mg total) by mouth 4 (four) times daily as needed.  Marland Kitchen  vortioxetine HBr (TRINTELLIX) 20 MG TABS tablet Take 1 tablet (20 mg total) by mouth daily.  . SUMAtriptan (IMITREX) 50 MG tablet Take 1 tablet (50 mg total) by mouth once for 1 dose. May repeat in 2 hours if headache persists or recurs.   No facility-administered medications prior to visit.    Review of Systems  Constitutional: Negative.   HENT: Negative.   Eyes: Negative.   Respiratory: Negative.   Cardiovascular: Negative.   Gastrointestinal: Positive for abdominal pain.  Endocrine: Negative.   Musculoskeletal: Positive for back pain.  Allergic/Immunologic: Negative.   Hematological:  Negative.   Psychiatric/Behavioral: The patient is nervous/anxious.        Objective    BP 118/75 (BP Location: Right Arm, Patient Position: Sitting, Cuff Size: Normal)   Pulse 64   Temp (!) 97.1 F (36.2 C) (Temporal)   Ht 5\' 2"  (1.575 m)   Wt 176 lb 3.2 oz (79.9 kg)   BMI 32.23 kg/m  BP Readings from Last 3 Encounters:  08/01/19 118/75  06/19/19 (!) 135/94  04/25/19 120/83   Wt Readings from Last 3 Encounters:  08/01/19 176 lb 3.2 oz (79.9 kg)  06/19/19 173 lb 3.2 oz (78.6 kg)  04/25/19 171 lb 12.8 oz (77.9 kg)      Physical Exam Vitals reviewed.  Constitutional:      Appearance: Normal appearance. She is well-developed. She is not diaphoretic.  HENT:     Head: Normocephalic and atraumatic.     Right Ear: External ear normal.     Left Ear: External ear normal.  Eyes:     General: No scleral icterus.    Conjunctiva/sclera: Conjunctivae normal.  Neck:     Thyroid: No thyromegaly.  Cardiovascular:     Rate and Rhythm: Normal rate and regular rhythm.     Pulses: Normal pulses.     Heart sounds: Normal heart sounds. No murmur heard.   Pulmonary:     Effort: Pulmonary effort is normal. No respiratory distress.     Breath sounds: Normal breath sounds. No wheezing, rhonchi or rales.  Musculoskeletal:     Cervical back: Neck supple.     Right lower leg: No edema.     Left lower leg: No edema.  Lymphadenopathy:     Cervical: No cervical adenopathy.  Skin:    General: Skin is warm and dry.     Findings: No rash.  Neurological:     Mental Status: She is alert and oriented to person, place, and time. Mental status is at baseline.  Psychiatric:        Mood and Affect: Mood normal.        Behavior: Behavior normal.       No results found for any visits on 08/01/19.  Assessment & Plan     1. Anxiety GAD-7 today is 1 Patient is markedly improved on BuSpar twice a day.  Continue this indefinitely.  2. Chronic pain syndrome I feel oxycodone.  Drug screen on  next visit. - oxyCODONE (ROXICODONE) 15 MG immediate release tablet; Take 1 tablet (15 mg total) by mouth every 6 (six) hours as needed for pain.  Dispense: 90 tablet; Refill: 0  3. Current episode of major depressive disorder without prior episode, unspecified depression episode severity PHQ-9 on next visit.  4. Wheezing Mild expiratory wheezes on today's visit.  Refill albuterol MDI. - albuterol (VENTOLIN HFA) 108 (90 Base) MCG/ACT inhaler; Inhale 2 puffs into the lungs every 4 (four) hours as needed for  wheezing or shortness of breath.  Dispense: 18 g; Refill: 4  5. Chronic pelvic pain in female [facility] by urogynecologist   No follow-ups on file.      I, [name], MD, have reviewed all documentation for this visit. The documentation on 08/05/19 for the exam, diagnosis, procedures, and orders are all accurate and complete.    Richard Wendelyn Breslow, MD  [facility] [phone] (phone) [phone] (fax)  [facility]

## 2019-08-01 ENCOUNTER — Encounter: Payer: Self-pay | Admitting: Family Medicine

## 2019-08-01 ENCOUNTER — Ambulatory Visit (INDEPENDENT_AMBULATORY_CARE_PROVIDER_SITE_OTHER): Payer: Medicare HMO | Admitting: Family Medicine

## 2019-08-01 ENCOUNTER — Other Ambulatory Visit: Payer: Self-pay

## 2019-08-01 VITALS — BP 118/75 | HR 64 | Temp 97.1°F | Ht 62.0 in | Wt 176.2 lb

## 2019-08-01 DIAGNOSIS — R102 Pelvic and perineal pain: Secondary | ICD-10-CM

## 2019-08-01 DIAGNOSIS — G8929 Other chronic pain: Secondary | ICD-10-CM

## 2019-08-01 DIAGNOSIS — G894 Chronic pain syndrome: Secondary | ICD-10-CM

## 2019-08-01 DIAGNOSIS — R69 Illness, unspecified: Secondary | ICD-10-CM | POA: Diagnosis not present

## 2019-08-01 DIAGNOSIS — R062 Wheezing: Secondary | ICD-10-CM | POA: Diagnosis not present

## 2019-08-01 DIAGNOSIS — F329 Major depressive disorder, single episode, unspecified: Secondary | ICD-10-CM

## 2019-08-01 DIAGNOSIS — F419 Anxiety disorder, unspecified: Secondary | ICD-10-CM | POA: Diagnosis not present

## 2019-08-01 MED ORDER — ALBUTEROL SULFATE HFA 108 (90 BASE) MCG/ACT IN AERS: 2.0000 | INHALATION_SPRAY | RESPIRATORY_TRACT | 4 refills | Status: DC | PRN

## 2019-08-05 MED ORDER — OXYCODONE HCL 15 MG PO TABS: 15.0000 mg | ORAL_TABLET | Freq: Four times a day (QID) | ORAL | 0 refills | Status: DC | PRN

## 2019-08-15 ENCOUNTER — Other Ambulatory Visit: Payer: Self-pay | Admitting: Family Medicine

## 2019-08-15 DIAGNOSIS — F329 Major depressive disorder, single episode, unspecified: Secondary | ICD-10-CM

## 2019-08-15 NOTE — Telephone Encounter (Signed)
Requested medication (s) are due for refill today: yes  Requested medication (s) are on the active medication list:yes  Last refill: 06/19/19   #60  0 refills  Future visit scheduled   Yes  11/04/19  Notes to clinic: not delegated  Requested Prescriptions  Pending Prescriptions Disp Refills   methylphenidate (RITALIN) 10 MG tablet 60 tablet 0    Sig: Take 1 tablet (10 mg total) by mouth 2 (two) times daily. 07/19/16      Not Delegated - Psychiatry:  Stimulants/ADHD Failed - 08/15/2019  8:48 AM      Failed - This refill cannot be delegated      Failed - Urine Drug Screen completed in last 360 days.      Passed - Valid encounter within last 3 months    Recent Outpatient Visits           2 weeks ago Anxiety   [facility] Maple Hudson., MD   1 month ago Anxiety   [facility] Maple Hudson., MD   3 months ago Annual physical exam   [facility] Maple Hudson., MD   4 months ago Flank pain, acute   [facility] Maple Hudson., MD   7 months ago Hypercholesteremia   [facility] Maple Hudson., MD       Future Appointments             In 2 months Maple Hudson., MD [facility]

## 2019-08-15 NOTE — Telephone Encounter (Signed)
Medication Refill - Medication: methylphenidate (RITALIN) 10 MG tablet    Preferred Pharmacy (with phone number or street name):  [id number] [place], [place] [address] Phone:  [phone]  Fax:  [phone]       Agent: Please be advised that RX refills may take up to 3 business days. We ask that you follow-up with your pharmacy.

## 2019-08-16 MED ORDER — METHYLPHENIDATE HCL 10 MG PO TABS: 10.0000 mg | ORAL_TABLET | Freq: Two times a day (BID) | ORAL | 0 refills | Status: DC

## 2019-08-27 ENCOUNTER — Ambulatory Visit: Payer: Self-pay | Admitting: Family Medicine

## 2019-09-02 ENCOUNTER — Telehealth: Payer: Self-pay | Admitting: Family Medicine

## 2019-09-02 NOTE — Telephone Encounter (Signed)
Copied from CRM [id number]. Topic: Medicare AWV >> Sep 02, 2019  2:57 PM Claudette Laws R wrote: Reason for CRM:  Left message for patient to call back and schedule Medicare Annual Wellness Visit (AWV) either virtually or in office.  No hx of AWV eligible as of 02/07/2009  Please schedule at anytime with [facility] Health Advisor.

## 2019-09-17 ENCOUNTER — Other Ambulatory Visit: Payer: Self-pay | Admitting: Family Medicine

## 2019-09-17 DIAGNOSIS — F329 Major depressive disorder, single episode, unspecified: Secondary | ICD-10-CM

## 2019-09-17 DIAGNOSIS — G894 Chronic pain syndrome: Secondary | ICD-10-CM

## 2019-09-17 NOTE — Telephone Encounter (Signed)
Medication Refill - Medication: levothyroxine, methylphenidate, clonazepam   Has the patient contacted their pharmacy? Yes.   (Agent: If no, request that the patient contact the pharmacy for the refill.) (Agent: If yes, when and what did the pharmacy advise?)  Preferred Pharmacy (with phone number or street name):  [facility] [address] - [place], [place] - [address] [facility]  [address] [place] [address]  Phone: [phone] Fax: [phone]  Hours: Not open 24 hours     Agent: Please be advised that RX refills may take up to 3 business days. We ask that you follow-up with your pharmacy.

## 2019-09-17 NOTE — Telephone Encounter (Signed)
Requested medication (s) are due for refill today: cyclobenzaprine: yes  Ritalin: yes     levothyroxine:-  Requested medication (s) are on the active medication list: Ritalin and cyclobenzaprine: yes           levothyroxine: hx med  Last refill:  cyclobenzaprine: 04/25/19         Ritalin: 08/16/19        Levothyroxine: yes  Future visit scheduled: yes  Notes to clinic:  pt requesting change of pharmacy and levothyroxine hx med and provider   Requested Prescriptions  Pending Prescriptions Disp Refills   cyclobenzaprine (FLEXERIL) 5 MG tablet 90 tablet 5    Sig: Take 1 tablet (5 mg total) by mouth 3 (three) times daily as needed for muscle spasms.      Not Delegated - Analgesics:  Muscle Relaxants Failed - 09/17/2019  8:39 AM      Failed - This refill cannot be delegated      Passed - Valid encounter within last 6 months    Recent Outpatient Visits           1 month ago Anxiety   [facility] Maple Hudson., MD   3 months ago Anxiety   [facility] Maple Hudson., MD   4 months ago Annual physical exam   [facility] Maple Hudson., MD   5 months ago Flank pain, acute   [facility] Maple Hudson., MD   8 months ago Hypercholesteremia   [facility] Maple Hudson., MD       Future Appointments             In 1 month Maple Hudson., MD [facility]              methylphenidate (RITALIN) 10 MG tablet 60 tablet 0    Sig: Take 1 tablet (10 mg total) by mouth 2 (two) times daily. 07/19/16      Not Delegated - Psychiatry:  Stimulants/ADHD Failed - 09/17/2019  8:39 AM      Failed - This refill cannot be delegated      Failed - Urine Drug Screen completed in last 360 days.      Passed - Valid encounter within last 3 months    Recent Outpatient Visits           1 month ago Anxiety   [facility] Maple Hudson., MD    3 months ago Anxiety   [facility] Maple Hudson., MD   4 months ago Annual physical exam   [facility] Maple Hudson., MD   5 months ago Flank pain, acute   [facility] Maple Hudson., MD   8 months ago Hypercholesteremia   [facility] Maple Hudson., MD       Future Appointments             In 1 month Maple Hudson., MD [facility]              levothyroxine (SYNTHROID) 25 MCG tablet      Sig: Take 1 tablet (25 mcg total) by mouth daily.      Endocrinology:  Hypothyroid Agents Failed - 09/17/2019  8:39 AM      Failed - TSH needs to be rechecked within 3 months after an abnormal result. Refill until TSH  is due.      Passed - TSH in normal range and within 360 days    TSH  Date Value Ref Range Status  12/28/2018 1.960 0.450 - 4.500 uIU/mL Final          Passed - Valid encounter within last 12 months    Recent Outpatient Visits           1 month ago Anxiety   [facility] Maple Hudson., MD   3 months ago Anxiety   [facility] Maple Hudson., MD   4 months ago Annual physical exam   [facility] Maple Hudson., MD   5 months ago Flank pain, acute   [facility] Maple Hudson., MD   8 months ago Hypercholesteremia   [facility] Maple Hudson., MD       Future Appointments             In 1 month Maple Hudson., MD [facility]

## 2019-09-18 MED ORDER — LEVOTHYROXINE SODIUM 25 MCG PO TABS: 25.0000 ug | ORAL_TABLET | Freq: Every day | ORAL | 1 refills | Status: DC

## 2019-09-18 MED ORDER — CYCLOBENZAPRINE HCL 5 MG PO TABS: 5.0000 mg | ORAL_TABLET | Freq: Three times a day (TID) | ORAL | 5 refills | Status: DC | PRN

## 2019-09-18 MED ORDER — METHYLPHENIDATE HCL 10 MG PO TABS: 10.0000 mg | ORAL_TABLET | Freq: Two times a day (BID) | ORAL | 0 refills | Status: DC

## 2019-09-19 DIAGNOSIS — R69 Illness, unspecified: Secondary | ICD-10-CM | POA: Diagnosis not present

## 2019-10-03 ENCOUNTER — Other Ambulatory Visit: Payer: Self-pay | Admitting: Family Medicine

## 2019-10-03 MED ORDER — CLONAZEPAM 0.5 MG PO TABS: 0.5000 mg | ORAL_TABLET | Freq: Every evening | ORAL | 3 refills | Status: DC | PRN

## 2019-10-03 NOTE — Telephone Encounter (Signed)
Requested medication (s) are due for refill today: no  Requested medication (s) are on the active medication list: yes  Last refill:  01/27/2020  Future visit scheduled: no  Notes to clinic:  this refill cannot be delegated    Requested Prescriptions  Pending Prescriptions Disp Refills   clonazePAM (KLONOPIN) 0.5 MG tablet 30 tablet 3    Sig: Take 1 tablet (0.5 mg total) by mouth at bedtime as needed.      Not Delegated - Psychiatry:  Anxiolytics/Hypnotics Failed - 10/03/2019  2:22 PM      Failed - This refill cannot be delegated      Failed - Urine Drug Screen completed in last 360 days.      Passed - Valid encounter within last 6 months    Recent Outpatient Visits           2 months ago Anxiety   [facility] Maple Hudson., MD   3 months ago Anxiety   [facility] Maple Hudson., MD   5 months ago Annual physical exam   [facility] Maple Hudson., MD   6 months ago Flank pain, acute   [facility] Maple Hudson., MD   9 months ago Hypercholesteremia   [facility] Maple Hudson., MD       Future Appointments             In 1 month Maple Hudson., MD [facility]

## 2019-10-03 NOTE — Telephone Encounter (Signed)
Please advise 

## 2019-10-03 NOTE — Telephone Encounter (Signed)
Copied from CRM [id number]. Topic: Quick Communication - Rx Refill/Question >> Oct 03, 2019  2:18 PM Jaquita Rector A wrote: Medication: clonazePAM (KLONOPIN) 0.5 MG tablet   Has the patient contacted their pharmacy? Yes.   (Agent: If no, request that the patient contact the pharmacy for the refill.) (Agent: If yes, when and what did the pharmacy advise?)  Preferred Pharmacy (with phone number or street name): [facility] [address] - [place], [place] - [address]  Phone:  [phone] Fax:  [phone]     Agent: Please be advised that RX refills may take up to 3 business days. We ask that you follow-up with your pharmacy.

## 2019-10-08 ENCOUNTER — Telehealth: Payer: Self-pay | Admitting: Family Medicine

## 2019-10-08 NOTE — Telephone Encounter (Signed)
Patient declined the Medicare Wellness Visit with N[facility] - doesn't feel she needs to do with going to so many different doctors.  I tried to explain that it would be beneficial to have our NHA complete and could be by phone, but she still declined.  Stated she would discuss with Dr Sullivan Lone and see if he wanted her to complete.

## 2019-10-09 DIAGNOSIS — G43109 Migraine with aura, not intractable, without status migrainosus: Secondary | ICD-10-CM | POA: Diagnosis not present

## 2019-10-10 NOTE — Telephone Encounter (Signed)
I would like her to do a Medicare wellness  through the nurse visit.  It can be virtual but I would like her to do it.

## 2019-10-16 ENCOUNTER — Other Ambulatory Visit: Payer: Self-pay | Admitting: Family Medicine

## 2019-10-16 DIAGNOSIS — G894 Chronic pain syndrome: Secondary | ICD-10-CM

## 2019-10-16 NOTE — Telephone Encounter (Signed)
Please advise 

## 2019-10-16 NOTE — Telephone Encounter (Signed)
Medication Refill - Medication: oxycodone 15mg   Has the patient contacted their pharmacy? No. (Agent: If no, request that the patient contact the pharmacy for the refill.) (Agent: If yes, when and what did the pharmacy advise?)  Preferred Pharmacy (with phone number or street name): [facility] - [place],  - [address]  Agent: Please be advised that RX refills may take up to 3 business days. We ask that you follow-up with your pharmacy.

## 2019-10-17 MED ORDER — OXYCODONE HCL 15 MG PO TABS: 15.0000 mg | ORAL_TABLET | Freq: Four times a day (QID) | ORAL | 0 refills | Status: DC | PRN

## 2019-10-18 ENCOUNTER — Other Ambulatory Visit: Payer: Self-pay | Admitting: Family Medicine

## 2019-10-18 DIAGNOSIS — G894 Chronic pain syndrome: Secondary | ICD-10-CM

## 2019-10-18 NOTE — Addendum Note (Signed)
Addended by: Lisabeth Pick on: 10/18/2019 12:48 PM   Modules accepted: Orders

## 2019-10-18 NOTE — Telephone Encounter (Signed)
°  Notes to clinic:  medication was sent to wrong pharmacy  Please send to [facility]    Requested Prescriptions  Pending Prescriptions Disp Refills   oxyCODONE (ROXICODONE) 15 MG immediate release tablet 85 tablet 0    Sig: Take 1 tablet (15 mg total) by mouth every 6 (six) hours as needed for pain.      Not Delegated - Analgesics:  Opioid Agonists Failed - 10/18/2019  1:08 PM      Failed - This refill cannot be delegated      Failed - Urine Drug Screen completed in last 360 days.      Passed - Valid encounter within last 6 months    Recent Outpatient Visits           2 months ago Anxiety   [facility] Maple Hudson., MD   4 months ago Anxiety   [facility] Maple Hudson., MD   5 months ago Annual physical exam   [facility] Maple Hudson., MD   6 months ago Flank pain, acute   [facility] Maple Hudson., MD   9 months ago Hypercholesteremia   [facility] Maple Hudson., MD       Future Appointments             In 2 weeks Maple Hudson., MD [facility]

## 2019-10-18 NOTE — Telephone Encounter (Signed)
Medication was just refilled by pcp yesterday.

## 2019-10-18 NOTE — Telephone Encounter (Signed)
PT need a new refill  oxyCODONE (ROXICODONE) 15 MG immediate release tablet [[id number]]  [facility] [address] [place], [place] - [address]  [address] [place] [address]  Phone: [phone] Fax: [phone]

## 2019-10-21 NOTE — Telephone Encounter (Signed)
Please resend this medication oxyCODONE (ROXICODONE) 15 MG immediate release tablet  To  [facility] [address] [place], [place] - [address] Phone:  [phone]  Fax:  [phone]     [facility]. Pt called last week with this same message.

## 2019-10-22 MED ORDER — OXYCODONE HCL 15 MG PO TABS: 15.0000 mg | ORAL_TABLET | Freq: Four times a day (QID) | ORAL | 0 refills | Status: DC | PRN

## 2019-10-22 NOTE — Telephone Encounter (Signed)
Patient called to request Rx to be sent to the  [facility] - [place], [place] - [address] Phone:  [phone]  Fax:  [phone]     Say that she is almost out and is hurting very badly after a long drive few days ago

## 2019-11-04 ENCOUNTER — Other Ambulatory Visit: Payer: Self-pay

## 2019-11-04 ENCOUNTER — Ambulatory Visit (INDEPENDENT_AMBULATORY_CARE_PROVIDER_SITE_OTHER): Payer: Medicare HMO | Admitting: Family Medicine

## 2019-11-04 ENCOUNTER — Encounter: Payer: Self-pay | Admitting: Family Medicine

## 2019-11-04 VITALS — BP 106/67 | HR 80 | Temp 98.6°F | Resp 18 | Ht 62.0 in | Wt 175.0 lb

## 2019-11-04 DIAGNOSIS — G8929 Other chronic pain: Secondary | ICD-10-CM

## 2019-11-04 DIAGNOSIS — M5412 Radiculopathy, cervical region: Secondary | ICD-10-CM

## 2019-11-04 DIAGNOSIS — G894 Chronic pain syndrome: Secondary | ICD-10-CM | POA: Diagnosis not present

## 2019-11-04 DIAGNOSIS — F329 Major depressive disorder, single episode, unspecified: Secondary | ICD-10-CM

## 2019-11-04 DIAGNOSIS — R102 Pelvic and perineal pain: Secondary | ICD-10-CM

## 2019-11-04 DIAGNOSIS — R69 Illness, unspecified: Secondary | ICD-10-CM | POA: Diagnosis not present

## 2019-11-04 DIAGNOSIS — E7800 Pure hypercholesterolemia, unspecified: Secondary | ICD-10-CM | POA: Diagnosis not present

## 2019-11-04 DIAGNOSIS — N3000 Acute cystitis without hematuria: Secondary | ICD-10-CM | POA: Diagnosis not present

## 2019-11-04 DIAGNOSIS — R351 Nocturia: Secondary | ICD-10-CM

## 2019-11-04 MED ORDER — TAMSULOSIN HCL 0.4 MG PO CAPS: 0.4000 mg | ORAL_CAPSULE | Freq: Every day | ORAL | 12 refills | Status: DC

## 2019-11-04 NOTE — Progress Notes (Signed)
I,Hannah Vance,acting as a scribe for Hannah Mans, MD.,have documented all relevant documentation on the behalf of Hannah Mans, MD,as directed by  Hannah Mans, MD while in the presence of Hannah Mans, MD.   Established patient visit   Patient: Hannah Vance   DOB: [birth year]   64 y.o. Female  MRN: [id number] Visit Date: 11/04/2019  Today's healthcare provider: [name], MD   Chief Complaint  Patient presents with  . Anxiety  . Depression  . Follow-up   Subjective    HPI  Patient is a couple of issues to discuss today. She has nocturia x3-4 with incomplete emptying. She states she is had problems with diarrhea since starting magnesium at 500 mg daily She has had "sinus" symptoms for the past 5 weeks. These are allergy symptoms for the most part,. She has chronic back pain, pelvic pain, and neuropathic pain. Anxiety, Follow-up  She was last seen for anxiety 3 months ago. Changes made at last visit include; Patient is markedly improved on BuSpar twice a day.  Continue this indefinitely.   She reports good compliance with treatment. She reports fair tolerance of treatment. She is not having side effects.   She feels her anxiety is mild and Unchanged since last visit.  Symptoms: No chest pain No difficulty concentrating  No dizziness No fatigue  No feelings of losing control No insomnia  No irritable No palpitations  No panic attacks No racing thoughts  No shortness of breath No sweating  No tremors/shakes    GAD-7 Results GAD-7 Generalized Anxiety Disorder Screening Tool 11/04/2019 06/19/2019  1. Feeling Nervous, Anxious, or on Edge 2 3  2. Not Being Able to Stop or Control Worrying 1 2  3. Worrying Too Much About Different Things 1 2  4. Trouble Relaxing 1 2  5. Being So Restless it's Hard To Sit Still 0 1  6. Becoming Easily Annoyed or Irritable 0 1  7. Feeling Afraid As If Something Awful Might Happen 0 1  Total GAD-7 Score  5 12  Difficulty At Work, Home, or Getting  Along With Others? Not difficult at all Very difficult    PHQ-9 Scores PHQ9 SCORE ONLY 11/04/2019 06/19/2019 04/25/2019  PHQ-9 Total Score 9 20 9     --------------------------------------------------------------------  Depression, Follow-up  She  was last seen for this 3 months ago. Changes made at last visit include; PHQ-9 on next visit.   She reports good compliance with treatment. She is not having side effects. none  She reports good tolerance of treatment. Current symptoms include: n/a She feels she is Improved since last visit.  Depression screen [facility] 2/9 11/04/2019 06/19/2019 04/25/2019  Decreased Interest 1 3 0  Down, Depressed, Hopeless 1 3 1   PHQ - 2 Score 2 6 1   Altered sleeping 2 3 2   Tired, decreased energy 3 3 3   Change in appetite 1 2 1   Feeling bad or failure about yourself  0 1 1  Trouble concentrating 1 3 1   Moving slowly or fidgety/restless 0 2 0  Suicidal thoughts 0 0 0  PHQ-9 Score 9 20 9   Difficult doing work/chores Somewhat difficult Very difficult Somewhat difficult    --------------------------------------------------------------------  Chronic pain syndrome From 08/01/2019-filled oxycodone.  Drug screen on next visit.      Medications: Outpatient Medications Prior to Visit  Medication Sig  . albuterol (VENTOLIN HFA) 108 (90 Base) MCG/ACT inhaler Inhale 2 puffs into the lungs every 4 (four) hours  as needed for wheezing or shortness of breath.  Marland Kitchen aspirin 81 MG EC tablet Take by mouth.  . busPIRone (BUSPAR) 10 MG tablet TAKE 1 TABLET BY MOUTH THREE TIMES A DAY  . clonazePAM (KLONOPIN) 0.5 MG tablet Take 1 tablet (0.5 mg total) by mouth at bedtime as needed.  . cyclobenzaprine (FLEXERIL) 5 MG tablet Take 1 tablet (5 mg total) by mouth 3 (three) times daily as needed for muscle spasms.  . fluticasone (FLONASE) 50 MCG/ACT nasal spray Place 2 sprays into both nostrils daily.  Chilton Si Tea, Camellia sinensis,  (GREEN TEA PO) Take by mouth.  . levothyroxine (SYNTHROID) 25 MCG tablet Take 1 tablet (25 mcg total) by mouth daily.  Marland Kitchen loratadine (CLARITIN) 10 MG tablet Take 1 tablet (10 mg total) by mouth daily.  . methylphenidate (RITALIN) 10 MG tablet Take 1 tablet (10 mg total) by mouth 2 (two) times daily. 07/19/16  . oxyCODONE (ROXICODONE) 15 MG immediate release tablet Take 1 tablet (15 mg total) by mouth every 6 (six) hours as needed for pain.  Marland Kitchen vortioxetine HBr (TRINTELLIX) 20 MG TABS tablet Take 1 tablet (20 mg total) by mouth daily.  . Oxycodone HCl 10 MG TABS Take 1 tablet (10 mg total) by mouth 4 (four) times daily as needed. (Patient not taking: Reported on 11/04/2019)  . SUMAtriptan (IMITREX) 50 MG tablet Take 1 tablet (50 mg total) by mouth once for 1 dose. May repeat in 2 hours if headache persists or recurs.   No facility-administered medications prior to visit.    Review of Systems  Constitutional: Negative for appetite change, chills, fatigue and fever.  Respiratory: Negative for chest tightness and shortness of breath.   Cardiovascular: Negative for chest pain and palpitations.  Gastrointestinal: Negative for abdominal pain, nausea and vomiting.  Neurological: Negative for dizziness and weakness.       Objective    BP 106/67 (BP Location: Left Arm, Patient Position: Sitting, Cuff Size: Normal)   Pulse 80   Temp 98.6 F (37 C) (Oral)   Resp 18   Ht 5\' 2"  (1.575 m)   Wt 175 lb (79.4 kg)   SpO2 97%   BMI 32.01 kg/m     Physical Exam Vitals reviewed.  Constitutional:      General: She is in acute distress.     Appearance: Normal appearance. She is well-developed. She is not diaphoretic.  HENT:     Head: Normocephalic and atraumatic.  Eyes:     General: No scleral icterus.    Conjunctiva/sclera: Conjunctivae normal.  Neck:     Thyroid: No thyromegaly.  Cardiovascular:     Rate and Rhythm: Normal rate and regular rhythm.     Pulses: Normal pulses.     Heart sounds:  Normal heart sounds. No murmur heard.   Pulmonary:     Effort: Pulmonary effort is normal. No respiratory distress.     Breath sounds: Normal breath sounds. No wheezing, rhonchi or rales.  Musculoskeletal:     Cervical back: Neck supple.     Right lower leg: No edema.     Left lower leg: No edema.  Lymphadenopathy:     Cervical: No cervical adenopathy.  Skin:    General: Skin is warm and dry.     Findings: No rash.  Neurological:     Mental Status: She is alert and oriented to person, place, and time. Mental status is at baseline.  Psychiatric:        Mood and Affect:  Mood normal.        Behavior: Behavior normal.        Thought Content: Thought content normal.        Judgment: Judgment normal.       Results for orders placed or performed in visit on 11/04/19  POCT urinalysis dipstick  Result Value Ref Range   Color, UA Yellow    Clarity, UA Cloudy    Glucose, UA Negative Negative   Bilirubin, UA Negative    Ketones, UA Negative    Spec Grav, UA 1.020 1.010 - 1.025   Blood, UA Negative    pH, UA 6.0 5.0 - 8.0   Protein, UA Positive (A) Negative   Urobilinogen, UA 0.2 0.2 or 1.0 E.U./dL   Nitrite, UA Negative    Leukocytes, UA Moderate (2+) (A) Negative   Appearance Normal    Odor Normal     Assessment & Plan      1. Chronic pain syndrome  - Pain Mgt Scrn (14 Drugs), Ur  2. Chronic pelvic pain in female Followed by urogynecology - Pain Mgt Scrn (14 Drugs), Ur  3. Cervical nerve root disorder  - Pain Mgt Scrn (14 Drugs), Ur  4. Nocturia Try Flomax. Weight is normal. - POCT urinalysis dipstick - tamsulosin (FLOMAX) 0.4 MG CAPS capsule; Take 1 capsule (0.4 mg total) by mouth daily.  Dispense: 30 capsule; Refill: 12  5. Acute cystitis without hematuria  - CULTURE, URINE COMPREHENSIVE  6. Current episode of major depressive disorder without prior episode, unspecified depression episode severity Clinically stable at this time.  7.  Hypercholesteremia   No follow-ups on file.      I, Quantavious Eggert, CMA, have reviewed all documentation for this visit. The documentation on 11/06/19 for the exam, diagnosis, procedures, and orders are all accurate and complete.    Richard Wendelyn Breslow, MD  [facility] [phone] (phone) [phone] (fax)  [facility]

## 2019-11-04 NOTE — Patient Instructions (Addendum)
Try gabapentin one at bedtime. Reduce magnesium Oxide to 200 mg daily.

## 2019-11-06 LAB — POCT URINALYSIS DIPSTICK
Appearance: NORMAL
Bilirubin, UA: NEGATIVE
Blood, UA: NEGATIVE
Glucose, UA: NEGATIVE
Ketones, UA: NEGATIVE
Nitrite, UA: NEGATIVE
Odor: NORMAL
Protein, UA: POSITIVE — AB
Spec Grav, UA: 1.02 (ref 1.010–1.025)
Urobilinogen, UA: 0.2 E.U./dL
pH, UA: 6 (ref 5.0–8.0)

## 2019-11-07 ENCOUNTER — Telehealth: Payer: Self-pay | Admitting: *Deleted

## 2019-11-07 NOTE — Telephone Encounter (Signed)
Copied from CRM [id number]. Topic: General - Other >> Nov 06, 2019  4:03 PM Dalphine Handing A wrote: Patient would like a callback today in regards to her lab results. Please advise

## 2019-11-07 NOTE — Telephone Encounter (Signed)
Will give patient a call when labs have been resulted by pcp.

## 2019-11-08 LAB — CULTURE, URINE COMPREHENSIVE

## 2019-11-08 NOTE — Telephone Encounter (Signed)
Will call patient when it has been resulted by pcp.

## 2019-11-08 NOTE — Telephone Encounter (Signed)
Fyi. KW 

## 2019-11-08 NOTE — Telephone Encounter (Signed)
Pt. Checking on urine culture. Instructed PCP will notify her when they are reviewed.

## 2019-11-12 NOTE — Telephone Encounter (Signed)
-----   Message from Maple Hudson., MD sent at 11/11/2019  8:08 AM EDT ----- Urine okay.

## 2019-11-12 NOTE — Telephone Encounter (Signed)
Advised pt of results.

## 2019-11-15 ENCOUNTER — Other Ambulatory Visit: Payer: Self-pay | Admitting: Family Medicine

## 2019-11-15 DIAGNOSIS — F329 Major depressive disorder, single episode, unspecified: Secondary | ICD-10-CM

## 2019-11-15 NOTE — Telephone Encounter (Signed)
Medication Refill - Medication: methylphenidate (RITALIN) 10 MG tablet    Preferred Pharmacy (with phone number or street name):  [facility] [address] - [place], [place] - [address] Phone:  [phone]  Fax:  [phone]       Agent: Please be advised that RX refills may take up to 3 business days. We ask that you follow-up with your pharmacy.

## 2019-11-15 NOTE — Telephone Encounter (Signed)
Requested medication (s) are due for refill today: yes  Requested medication (s) are on the active medication list: yes  Last refill:  09/18/19 #60 0 refills   Future visit scheduled: yes  Notes to clinic:  not delegated per protocol     Requested Prescriptions  Pending Prescriptions Disp Refills   methylphenidate (RITALIN) 10 MG tablet 60 tablet 0    Sig: Take 1 tablet (10 mg total) by mouth 2 (two) times daily. 07/19/16      Not Delegated - Psychiatry:  Stimulants/ADHD Failed - 11/15/2019 11:13 AM      Failed - This refill cannot be delegated      Failed - Urine Drug Screen completed in last 360 days.      Passed - Valid encounter within last 3 months    Recent Outpatient Visits           1 week ago Chronic pain syndrome   [facility] Maple Hudson., MD   3 months ago Anxiety   [facility] Maple Hudson., MD   4 months ago Anxiety   [facility] Maple Hudson., MD   6 months ago Annual physical exam   [facility] Maple Hudson., MD   7 months ago Flank pain, acute   [facility] Maple Hudson., MD       Future Appointments             In 1 month Maple Hudson., MD [facility]

## 2019-11-18 MED ORDER — METHYLPHENIDATE HCL 10 MG PO TABS: 10.0000 mg | ORAL_TABLET | Freq: Two times a day (BID) | ORAL | 0 refills | Status: DC

## 2019-12-13 LAB — FECAL OCCULT BLOOD, GUAIAC: Fecal Occult Blood: NEGATIVE

## 2019-12-30 ENCOUNTER — Telehealth: Payer: Self-pay | Admitting: Family Medicine

## 2019-12-30 DIAGNOSIS — G894 Chronic pain syndrome: Secondary | ICD-10-CM

## 2019-12-30 DIAGNOSIS — F329 Major depressive disorder, single episode, unspecified: Secondary | ICD-10-CM

## 2019-12-30 MED ORDER — OXYCODONE HCL 15 MG PO TABS: 15.0000 mg | ORAL_TABLET | Freq: Four times a day (QID) | ORAL | 0 refills | Status: DC | PRN

## 2019-12-30 MED ORDER — METHYLPHENIDATE HCL 10 MG PO TABS: 10.0000 mg | ORAL_TABLET | Freq: Two times a day (BID) | ORAL | 0 refills | Status: DC

## 2019-12-30 NOTE — Telephone Encounter (Signed)
PT need a refill  oxyCODONE (ROXICODONE) 15 MG immediate release tablet [[id number]]  methylphenidate (RITALIN) 10 MG tablet [[id number]]  [facility] [address] [place], [place] - [address]  [address], [place] [address]  Phone:  [phone] Fax:  [phone]

## 2019-12-30 NOTE — Telephone Encounter (Signed)
Requested medication (s) are due for refill today: yes  Requested medication (s) are on the active medication list: yes  Last refill:  10/22/2019  Future visit scheduled: yes  Notes to clinic:  this refill cannot be delegated    Requested Prescriptions  Pending Prescriptions Disp Refills   methylphenidate (RITALIN) 10 MG tablet 60 tablet 0    Sig: Take 1 tablet (10 mg total) by mouth 2 (two) times daily. 07/19/16      Not Delegated - Psychiatry:  Stimulants/ADHD Failed - 12/30/2019  9:03 AM      Failed - This refill cannot be delegated      Failed - Urine Drug Screen completed in last 360 days      Passed - Valid encounter within last 3 months    Recent Outpatient Visits           1 month ago Chronic pain syndrome   [facility] Maple Hudson., MD   5 months ago Anxiety   [facility] Maple Hudson., MD   6 months ago Anxiety   [facility] Maple Hudson., MD   8 months ago Annual physical exam   [facility] Maple Hudson., MD   9 months ago Flank pain, acute   [facility] Hudson., MD       Future Appointments             In 1 week Maple Hudson., MD [facility]              oxyCODONE (ROXICODONE) 15 MG immediate release tablet 85 tablet 0    Sig: Take 1 tablet (15 mg total) by mouth every 6 (six) hours as needed for pain.      Not Delegated - Analgesics:  Opioid Agonists Failed - 12/30/2019  9:03 AM      Failed - This refill cannot be delegated      Failed - Urine Drug Screen completed in last 360 days      Passed - Valid encounter within last 6 months    Recent Outpatient Visits           1 month ago Chronic pain syndrome   [facility] Maple Hudson., MD   5 months ago Anxiety   [facility] Maple Hudson., MD   6 months ago Anxiety   [facility]  Maple Hudson., MD   8 months ago Annual physical exam   [facility] Maple Hudson., MD   9 months ago Flank pain, acute   [facility] Maple Hudson., MD       Future Appointments             In 1 week Maple Hudson., MD [facility]

## 2019-12-31 MED ORDER — OXYCODONE HCL 15 MG PO TABS: 15.0000 mg | ORAL_TABLET | Freq: Four times a day (QID) | ORAL | 0 refills | Status: DC | PRN

## 2019-12-31 NOTE — Telephone Encounter (Signed)
Please review. Thanks!  

## 2019-12-31 NOTE — Addendum Note (Signed)
Addended by: Mila Merry E on: 12/31/2019 10:15 AM   Modules accepted: Orders

## 2019-12-31 NOTE — Telephone Encounter (Signed)
Pt calling stating that the she has taken her last pill of the oxycodone and is needing to have this refilled sooner than 01/25/20. Pt states that she has had to be more active due to having to watch and help her mother, raking leaves, etc.  Please advise.

## 2020-01-01 NOTE — Progress Notes (Signed)
Established patient visit   Patient: Hannah Vance   DOB: 06-Jan-1956   64 y.o. Female  MRN: [id number] Visit Date: 01/06/2020  Today's healthcare provider: [name], MD   Chief Complaint  Patient presents with  . Depression  . Pain   Subjective    HPI  Patient is now living with continued care of her mother.  Her mother had a CVA. For her anxiety patient states she takes her BuSpar as needed instead of as prescribed. She has chronic pain that has worsened since she has had to take care of her mother. Her new complaint is 1 of some urinary dribbling recently.  No other symptoms. Chronic pain syndrome From 11/04/2019-Pain Mgt Scrn (14 Drugs)---patient could not give urine.  Chronic pelvic pain in female From 11/04/2019-Followed by urogynecology.   Nocturia From 11/04/2019-Try Flomax. Weight is normal.  Current episode of major depressive disorder without prior episode, unspecified depression episode severity From 11/04/2019-Clinically stable at this time. Patient reports that she only uses Buspar as needed due it giving her diarrhea.   Depression screen [facility] 2/9 01/06/2020 11/04/2019 06/19/2019  Decreased Interest 0 1 3  Down, Depressed, Hopeless 1 1 3   PHQ - 2 Score 1 2 6   Altered sleeping 1 2 3   Tired, decreased energy 1 3 3   Change in appetite 1 1 2   Feeling bad or failure about yourself  2 0 1  Trouble concentrating 1 1 3   Moving slowly or fidgety/restless 0 0 2  Suicidal thoughts 0 0 0  PHQ-9 Score 7 9 20   Difficult doing work/chores Not difficult at all Somewhat difficult Very difficult   GAD 7 : Generalized Anxiety Score 01/06/2020 11/04/2019 06/19/2019  Nervous, Anxious, on Edge 1 2 3   Control/stop worrying 0 1 2  Worry too much - different things 1 1 2   Trouble relaxing 1 1 2   Restless 1 0 1  Easily annoyed or irritable 0 0 1  Afraid - awful might happen 1 0 1  Total GAD 7 Score 5 5 12   Anxiety Difficulty Somewhat difficult Not difficult at all  Very difficult          Medications: Outpatient Medications Prior to Visit  Medication Sig  . albuterol (VENTOLIN HFA) 108 (90 Base) MCG/ACT inhaler Inhale 2 puffs into the lungs every 4 (four) hours as needed for wheezing or shortness of breath.  aspirin 81 MG EC tablet Take by mouth.  . busPIRone (BUSPAR) 10 MG tablet TAKE 1 TABLET BY MOUTH THREE TIMES A DAY  . clonazePAM (KLONOPIN) 0.5 MG tablet Take 1 tablet (0.5 mg total) by mouth at bedtime as needed.  . cyclobenzaprine (FLEXERIL) 5 MG tablet Take 1 tablet (5 mg total) by mouth 3 (three) times daily as needed for muscle spasms.  . fluticasone (FLONASE) 50 MCG/ACT nasal spray Place 2 sprays into both nostrils daily.  Tea, Camellia sinensis, (GREEN TEA PO) Take by mouth.  . levothyroxine (SYNTHROID) 25 MCG tablet Take 1 tablet (25 mcg total) by mouth daily.  loratadine (CLARITIN) 10 MG tablet Take 1 tablet (10 mg total) by mouth daily.  . methylphenidate (RITALIN) 10 MG tablet Take 1 tablet (10 mg total) by mouth 2 (two) times daily. 07/19/16  . oxyCODONE (ROXICODONE) 15 MG immediate release tablet Take 1 tablet (15 mg total) by mouth every 6 (six) hours as needed for pain.  . tamsulosin (FLOMAX) 0.4 MG CAPS capsule Take 1 capsule (0.4 mg total)  by mouth daily.  Marland Kitchen vortioxetine HBr (TRINTELLIX) 20 MG TABS tablet Take 1 tablet (20 mg total) by mouth daily.  . Oxycodone HCl 10 MG TABS Take 1 tablet (10 mg total) by mouth 4 (four) times daily as needed. (Patient not taking: Reported on 11/04/2019)  . SUMAtriptan (IMITREX) 50 MG tablet Take 1 tablet (50 mg total) by mouth once for 1 dose. May repeat in 2 hours if headache persists or recurs.   No facility-administered medications prior to visit.    Review of Systems  Constitutional: Negative for appetite change, chills, fatigue and fever.  Respiratory: Negative for chest tightness and shortness of breath.   Cardiovascular: Negative for chest pain and palpitations.    Gastrointestinal: Negative for abdominal pain, nausea and vomiting.  Neurological: Negative for dizziness and weakness.       Objective    BP 120/78   Pulse 87   Resp 16   Wt 179 lb (81.2 kg)   BMI 32.74 kg/m  BP Readings from Last 3 Encounters:  01/06/20 120/78  11/04/19 106/67  08/01/19 118/75   Wt Readings from Last 3 Encounters:  01/06/20 179 lb (81.2 kg)  11/04/19 175 lb (79.4 kg)  08/01/19 176 lb 3.2 oz (79.9 kg)      Physical Exam Vitals reviewed.  Constitutional:      Appearance: Normal appearance. She is well-developed.  HENT:     Head: Normocephalic and atraumatic.     Right Ear: External ear normal.     Left Ear: External ear normal.  Eyes:     General: No scleral icterus.    Conjunctiva/sclera: Conjunctivae normal.  Neck:     Thyroid: No thyromegaly.  Cardiovascular:     Rate and Rhythm: Normal rate and regular rhythm.     Pulses: Normal pulses.     Heart sounds: Normal heart sounds. No murmur heard.   Pulmonary:     Effort: Pulmonary effort is normal. No respiratory distress.     Breath sounds: Normal breath sounds. No wheezing, rhonchi or rales.  Abdominal:     Palpations: Abdomen is soft.  Musculoskeletal:     Cervical back: Neck supple.     Right lower leg: No edema.     Left lower leg: No edema.  Lymphadenopathy:     Cervical: No cervical adenopathy.  Skin:    General: Skin is warm.     Findings: No rash.  Neurological:     Mental Status: She is alert and oriented to person, place, and time. Mental status is at baseline.  Psychiatric:        Mood and Affect: Mood normal.        Behavior: Behavior normal.       No results found for any visits on 01/06/20.  Assessment & Plan     1. Need for influenza vaccination Patient refuses Covid vaccine despite my recommendation - Flu Vaccine QUAD 6+ mos PF IM (Fluarix Quad PF)  2. Hypercholesteremia  - Lipid panel  3. Current episode of major depressive disorder without prior  episode, unspecified depression episode severity Clinically stable.  PHQ-9 is actually good  4. Chronic pain syndrome Advised patient to take narcotics as little as possible.  Drug screen done today.  May need referral to pain clinic in the future - Pain Mgt Scrn (14 Drugs), Ur  5. Nocturia May need referral but I do not think this is going to be an issue. - Urine Culture  6. Adult hypothyroidism  -  TSH  7. Anxiety Chronic issue.  Recommended using the BuSpar twice a day. - CBC with Differential/Platelet  8. Frequent headaches Not really an issue ongoing for this patient. - Comprehensive metabolic panel   No follow-ups on file.      I, [name], MD, have reviewed all documentation for this visit. The documentation on 01/09/20 for the exam, diagnosis, procedures, and orders are all accurate and complete.    Shakinah Navis Wendelyn Breslow, MD  [facility] [phone] (phone) [phone] (fax)  [facility]

## 2020-01-06 ENCOUNTER — Encounter: Payer: Self-pay | Admitting: Family Medicine

## 2020-01-06 ENCOUNTER — Other Ambulatory Visit: Payer: Self-pay

## 2020-01-06 ENCOUNTER — Ambulatory Visit (INDEPENDENT_AMBULATORY_CARE_PROVIDER_SITE_OTHER): Payer: Medicare HMO | Admitting: Family Medicine

## 2020-01-06 VITALS — BP 120/78 | HR 87 | Resp 16 | Wt 179.0 lb

## 2020-01-06 DIAGNOSIS — Z23 Encounter for immunization: Secondary | ICD-10-CM

## 2020-01-06 DIAGNOSIS — E7800 Pure hypercholesterolemia, unspecified: Secondary | ICD-10-CM | POA: Diagnosis not present

## 2020-01-06 DIAGNOSIS — G894 Chronic pain syndrome: Secondary | ICD-10-CM | POA: Diagnosis not present

## 2020-01-06 DIAGNOSIS — R519 Headache, unspecified: Secondary | ICD-10-CM

## 2020-01-06 DIAGNOSIS — R69 Illness, unspecified: Secondary | ICD-10-CM | POA: Diagnosis not present

## 2020-01-06 DIAGNOSIS — E039 Hypothyroidism, unspecified: Secondary | ICD-10-CM

## 2020-01-06 DIAGNOSIS — F329 Major depressive disorder, single episode, unspecified: Secondary | ICD-10-CM

## 2020-01-06 DIAGNOSIS — R351 Nocturia: Secondary | ICD-10-CM | POA: Diagnosis not present

## 2020-01-06 DIAGNOSIS — F419 Anxiety disorder, unspecified: Secondary | ICD-10-CM

## 2020-01-08 ENCOUNTER — Telehealth: Payer: Self-pay

## 2020-01-08 ENCOUNTER — Encounter: Payer: Self-pay | Admitting: Family Medicine

## 2020-01-08 LAB — URINE CULTURE: Organism ID, Bacteria: NO GROWTH

## 2020-01-08 NOTE — Telephone Encounter (Signed)
-----   Message from Maple Hudson., MD sent at 01/08/2020  8:07 AM EST ----- Normal urine.

## 2020-01-08 NOTE — Telephone Encounter (Signed)
Pt advised.   Thanks,   -Chris Narasimhan  

## 2020-01-09 LAB — PAIN MGT SCRN (14 DRUGS), UR
Amphetamine Scrn, Ur: NEGATIVE ng/mL
BARBITURATE SCREEN URINE: NEGATIVE ng/mL
BENZODIAZEPINE SCREEN, URINE: NEGATIVE ng/mL
Buprenorphine, Urine: NEGATIVE ng/mL
CANNABINOIDS UR QL SCN: NEGATIVE ng/mL
Cocaine (Metab) Scrn, Ur: NEGATIVE ng/mL
Creatinine(Crt), U: 36.3 mg/dL (ref 20.0–300.0)
Fentanyl, Urine: NEGATIVE pg/mL
Meperidine Screen, Urine: NEGATIVE ng/mL
Methadone Screen, Urine: NEGATIVE ng/mL
OXYCODONE+OXYMORPHONE UR QL SCN: POSITIVE ng/mL — AB
Opiate Scrn, Ur: NEGATIVE ng/mL
Ph of Urine: 6.1 (ref 4.5–8.9)
Phencyclidine Qn, Ur: NEGATIVE ng/mL
Propoxyphene Scrn, Ur: NEGATIVE ng/mL
Tramadol Screen, Urine: NEGATIVE ng/mL

## 2020-01-22 ENCOUNTER — Ambulatory Visit (INDEPENDENT_AMBULATORY_CARE_PROVIDER_SITE_OTHER): Payer: Medicare HMO | Admitting: Family Medicine

## 2020-01-22 DIAGNOSIS — R059 Cough, unspecified: Secondary | ICD-10-CM

## 2020-01-22 DIAGNOSIS — J069 Acute upper respiratory infection, unspecified: Secondary | ICD-10-CM

## 2020-01-22 DIAGNOSIS — J0140 Acute pansinusitis, unspecified: Secondary | ICD-10-CM

## 2020-01-22 MED ORDER — AZITHROMYCIN 250 MG PO TABS: ORAL_TABLET | ORAL | 0 refills | Status: DC

## 2020-01-22 NOTE — Progress Notes (Signed)
Virtual telephone visit    Virtual Visit via Telephone Note   This visit type was conducted due to national recommendations for restrictions regarding the COVID-19 Pandemic (e.g. social distancing) in an effort to limit this patient's exposure and mitigate transmission in our community. Due to her co-morbid illnesses, this patient is at least at moderate risk for complications without adequate follow up. This format is felt to be most appropriate for this patient at this time. The patient did not have access to video technology or had technical difficulties with video requiring transitioning to audio format only (telephone). Physical exam was limited to content and character of the telephone converstion.    Patient location: Home Provider location: Office  I discussed the limitations of evaluation and management by telemedicine and the availability of in person appointments. The patient expressed understanding and agreed to proceed.   Visit Date: 01/22/2020  Today's healthcare provider: [name], MD   Chief Complaint  Patient presents with  . Sinusitis  . Cough   Subjective    Cough This is a recurrent problem. The current episode started 1 to 4 weeks ago. The problem has been gradually worsening (Especially in the last 6 days. ). The cough is productive of sputum. Associated symptoms include headaches, postnasal drip, rhinorrhea, a sore throat and shortness of breath. Pertinent negatives include no ear pain, hemoptysis or wheezing.    Unvaccinated for Covid patient does telephone call visit for 2 weeks of sinus drainage and sinus pain and than 5 days of cough that is mildly productive.  The cough is very croupy.  Been using just regular Robitussin and that is fine.  She is using Tylenol for the headache.  No fevers dyspnea or myalgias.    Patient Active Problem List   Diagnosis Date Noted  . Neuropathy 07/06/2016  . Fatigue 07/06/2016  . Pelvic floor dysfunction  03/24/2016  . Chronic pain syndrome 10/08/2015  . Chronic pelvic pain in female 07/01/2014  . Anxiety 06/23/2014  . Pain in joint 06/23/2014  . 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 Clinical depression 06/23/2014  . Hypercholesteremia 06/23/2014  . Cannot sleep 06/23/2014  . Billowing mitral valve 06/23/2014  . Anancastic neurosis 06/23/2014  . Avitaminosis D 06/23/2014  . Hypothyroidism (acquired) 07/09/2012   No past medical history on file. Social History   Tobacco Use  . Smoking status: Never Smoker  . Smokeless tobacco: Never Used  Substance Use Topics  . Alcohol use: No    Alcohol/week: 0.0 standard drinks  . Drug use: No   Allergies  Allergen Reactions  . Amoxicillin Other (See Comments)  . Morphine Other (See Comments)    Trouble walking, heavy legs  . Quetiapine Fumarate Other (See Comments)    Mouth and tongue numbness   . Septra  [Sulfamethoxazole-Trimethoprim]   . Sulfa Antibiotics Other (See Comments)    burning  . Methadone Nausea And Vomiting      Medications: Outpatient Medications Prior to Visit  Medication Sig  . albuterol (VENTOLIN HFA) 108 (90 Base) MCG/ACT inhaler Inhale 2 puffs into the lungs every 4 (four) hours as needed for wheezing or shortness of breath.  . busPIRone (BUSPAR) 10 MG tablet TAKE 1 TABLET BY MOUTH THREE TIMES A DAY  . clonazePAM (KLONOPIN) 0.5 MG tablet Take 1 tablet (0.5 mg total) by mouth at bedtime as needed.  . cyclobenzaprine (FLEXERIL) 5 MG tablet Take 1 tablet (5 mg total) by mouth 3 (three) times daily as needed  for muscle spasms.  . fluticasone (FLONASE) 50 MCG/ACT nasal spray Place 2 sprays into both nostrils daily.  Chilton Si Tea, Camellia sinensis, (GREEN TEA PO) Take by mouth.  . levothyroxine (SYNTHROID) 25 MCG tablet Take 1 tablet (25 mcg total) by mouth daily.  Marland Kitchen loratadine (CLARITIN) 10 MG tablet Take 1 tablet (10 mg total) by mouth daily.  . methylphenidate (RITALIN) 10 MG tablet Take 1 tablet (10 mg  total) by mouth 2 (two) times daily. 07/19/16  . oxyCODONE (ROXICODONE) 15 MG immediate release tablet Take 1 tablet (15 mg total) by mouth every 6 (six) hours as needed for pain.  . tamsulosin (FLOMAX) 0.4 MG CAPS capsule Take 1 capsule (0.4 mg total) by mouth daily.  Marland Kitchen vortioxetine HBr (TRINTELLIX) 20 MG TABS tablet Take 1 tablet (20 mg total) by mouth daily.  Marland Kitchen aspirin 81 MG EC tablet Take by mouth. (Patient not taking: Reported on 01/22/2020)  . Oxycodone HCl 10 MG TABS Take 1 tablet (10 mg total) by mouth 4 (four) times daily as needed. (Patient not taking: Reported on 01/22/2020)  . SUMAtriptan (IMITREX) 50 MG tablet Take 1 tablet (50 mg total) by mouth once for 1 dose. May repeat in 2 hours if headache persists or recurs.   No facility-administered medications prior to visit.    Review of Systems  Constitutional: Positive for fatigue.  HENT: Positive for congestion, postnasal drip, rhinorrhea, sinus pressure, sinus pain and sore throat. Negative for ear discharge, ear pain, hearing loss, sneezing and trouble swallowing.   Respiratory: Positive for cough, chest tightness and shortness of breath. Negative for hemoptysis, choking and wheezing.   Gastrointestinal: Positive for nausea. Negative for abdominal distention, abdominal pain, anal bleeding, blood in stool, constipation, diarrhea, rectal pain and vomiting.  Neurological: Positive for headaches. Negative for dizziness and light-headedness.     Objective    There were no vitals taken for this visit.  She has a croupy cough throughout the telephone visit but is in no respiratory distress.  Mild laryngitis noted.  She is not wheezing she is alert and oriented.   Assessment & Plan     1. Cough Covid test ordered which patient will do tomorrow if she does not have a ride today.  Again, she is unvaccinated. - Novel Coronavirus, NAA ([facility])  2. Viral upper respiratory tract infection If she worsens he is to let us know.  3.  Subacute pansinusitis  - azithromycin (ZITHROMAX) 250 MG tablet; UAD  Dispense: 6 tablet; Refill: 0   No follow-ups on file.    I discussed the assessment and treatment plan with the patient. The patient was provided an opportunity to ask questions and all were answered. The patient agreed with the plan and demonstrated an understanding of the instructions.   The patient was advised to call back or seek an in-person evaluation if the symptoms worsen or if the condition fails to improve as anticipated.  I provided 12 minutes of non-face-to-face time during this encounter.    Anaiyah Anglemyer Wendelyn Breslow, MD [facility] [phone] (phone) [phone] (fax)  [facility]

## 2020-01-23 DIAGNOSIS — R059 Cough, unspecified: Secondary | ICD-10-CM | POA: Diagnosis not present

## 2020-01-24 LAB — SARS-COV-2, NAA 2 DAY TAT

## 2020-01-24 LAB — NOVEL CORONAVIRUS, NAA: SARS-CoV-2, NAA: NOT DETECTED

## 2020-01-27 ENCOUNTER — Telehealth: Payer: Self-pay

## 2020-01-27 NOTE — Telephone Encounter (Signed)
Patient given results as noted by Dr. Sullivan Lone on 01/24/20, patient verbalized understanding. She is still coughing while talking. She asks how long will the cough last. She says she's almost done with the antibiotic. I asked is she taking OTC cough medicine. She says she's taken Mucinex. I advised the cough, because it's noted to be viral in the OV notes, could last for weeks after the antibiotic, but it should be better. She says she can't afford for any more medicine at this time, so she will wait to see how it does over the next few days and call back it it's not better.     Richard Hulen Shouts., MD  01/24/2020 2:30 PM EST      No covid.

## 2020-02-27 ENCOUNTER — Other Ambulatory Visit: Payer: Self-pay | Admitting: Family Medicine

## 2020-02-27 DIAGNOSIS — F329 Major depressive disorder, single episode, unspecified: Secondary | ICD-10-CM

## 2020-02-27 NOTE — Telephone Encounter (Signed)
Medication Refill - Medication: generic ritalin 10 mg  Has the patient contacted their pharmacy? Yes. Pt was told to call md office (Preferred Pharmacy (with phone number or street name): [facility] [address] in [place]. Phone [phone]  Agent: Please be advised that RX refills may take up to 3 business days. We ask that you follow-up with your pharmacy.

## 2020-02-27 NOTE — Telephone Encounter (Signed)
Requested medications are due for refill today.  yes  Requested medications are on the active medications list.  yes  Last refill. 11/22/202  Future visit scheduled.   Yes 2 months  Notes to clinic.  No delegated.

## 2020-02-28 MED ORDER — METHYLPHENIDATE HCL 10 MG PO TABS: 10.0000 mg | ORAL_TABLET | Freq: Two times a day (BID) | ORAL | 0 refills | Status: DC

## 2020-03-11 ENCOUNTER — Other Ambulatory Visit: Payer: Self-pay | Admitting: Family Medicine

## 2020-03-11 DIAGNOSIS — G894 Chronic pain syndrome: Secondary | ICD-10-CM

## 2020-03-11 MED ORDER — OXYCODONE HCL 15 MG PO TABS: 15.0000 mg | ORAL_TABLET | Freq: Three times a day (TID) | ORAL | 0 refills | Status: DC | PRN

## 2020-03-11 NOTE — Telephone Encounter (Signed)
Requested medication (s) are due for refill today: yes  Requested medication (s) are on the active medication list: yes  Last refill:  12/31/19  Future visit scheduled: yes  Notes to clinic:  not delegated   Requested Prescriptions  Pending Prescriptions Disp Refills   oxyCODONE (ROXICODONE) 15 MG immediate release tablet 85 tablet 0    Sig: Take 1 tablet (15 mg total) by mouth every 6 (six) hours as needed for pain.      Not Delegated - Analgesics:  Opioid Agonists Failed - 03/11/2020  8:46 AM      Failed - This refill cannot be delegated      Failed - Urine Drug Screen completed in last 360 days      Passed - Valid encounter within last 6 months    Recent Outpatient Visits           1 month ago Cough   [facility] Maple Hudson., MD   2 months ago Need for influenza vaccination   [facility] Maple Hudson., MD   4 months ago Chronic pain syndrome   [facility] Maple Hudson., MD   7 months ago Anxiety   [facility] Maple Hudson., MD   8 months ago Anxiety   [facility] Maple Hudson., MD       Future Appointments             In 1 month Maple Hudson., MD [facility]

## 2020-03-11 NOTE — Telephone Encounter (Signed)
Medication Refill - Medication: oxyCODONE (ROXICODONE) 15 MG immediate release tablet Medication Date: 12/31/2019 Department: [facility] Practice Ordering/Authorizing: Malva Limes, MD    Order Providers    Has the patient contacted their pharmacy? Yes (Agent: If no, request that the patient contact the pharmacy for the refill.) (Agent: If yes, when and what did the pharmacy advise?) call dr  Preferred Pharmacy (with phone number or street name): [facility] [address] - [place], [place] - [address] Phone:  [phone]       Agent: Please be advised that RX refills may take up to 3 business days. We ask that you follow-up with your pharmacy.

## 2020-03-25 DIAGNOSIS — H532 Diplopia: Secondary | ICD-10-CM | POA: Diagnosis not present

## 2020-03-25 DIAGNOSIS — H5051 Esophoria: Secondary | ICD-10-CM | POA: Diagnosis not present

## 2020-04-10 ENCOUNTER — Telehealth: Payer: Self-pay | Admitting: Family Medicine

## 2020-04-10 DIAGNOSIS — F330 Major depressive disorder, recurrent, mild: Secondary | ICD-10-CM

## 2020-04-10 MED ORDER — VORTIOXETINE HBR 20 MG PO TABS: 20.0000 mg | ORAL_TABLET | Freq: Every day | ORAL | 12 refills | Status: DC

## 2020-04-10 NOTE — Telephone Encounter (Signed)
Pt was advised to call Dr.Gilbert and have him give the Rx for vortioxetine HBr (TRINTELLIX) 20 MG TABS tablet   over the phone to [facility] [phone]/ they will refill this Rx until she completes the assistance application / please advise

## 2020-04-17 DIAGNOSIS — Z0100 Encounter for examination of eyes and vision without abnormal findings: Secondary | ICD-10-CM | POA: Diagnosis not present

## 2020-04-29 ENCOUNTER — Other Ambulatory Visit: Payer: Self-pay | Admitting: Family Medicine

## 2020-04-29 DIAGNOSIS — F329 Major depressive disorder, single episode, unspecified: Secondary | ICD-10-CM

## 2020-04-29 MED ORDER — METHYLPHENIDATE HCL 10 MG PO TABS: 10.0000 mg | ORAL_TABLET | Freq: Two times a day (BID) | ORAL | 0 refills | Status: DC

## 2020-04-29 NOTE — Telephone Encounter (Signed)
Requested medication (s) are due for refill today: yes  Requested medication (s) are on the active medication list: yes  Last refill: 02/28/2020  Future visit scheduled: yes  Notes to clinic:  this refill cannot be delegated    Requested Prescriptions  Pending Prescriptions Disp Refills   methylphenidate (RITALIN) 10 MG tablet 60 tablet 0    Sig: Take 1 tablet (10 mg total) by mouth 2 (two) times daily. 07/19/16      There is no refill protocol information for this order

## 2020-04-29 NOTE — Telephone Encounter (Signed)
Medication Refill - Medication: methylphenidate 10 mg  Has the patient contacted their pharmacy?yes told to call office   Preferred Pharmacy (with phone number or street name): [facility] [address] phone number [phone] Agent: Please be advised that RX refills may take up to 3 business days. We ask that you follow-up with your pharmacy.

## 2020-04-29 NOTE — Telephone Encounter (Signed)
Requested medication (s) are due for refill today: yes  Requested medication (s) are on the active medication list: yes  Last refill:  02/09/20  Future visit scheduled: yes  Notes to clinic:  not delegated    Requested Prescriptions  Pending Prescriptions Disp Refills   clonazePAM (KLONOPIN) 0.5 MG tablet [Pharmacy Med Name: clonazePAM 0.5 MG TABLET] 30 tablet     Sig: TAKE ONE TABLET BY MOUTH AT BEDTIME AS NEEDED      Not Delegated - Psychiatry:  Anxiolytics/Hypnotics Failed - 04/29/2020  8:15 AM      Failed - This refill cannot be delegated      Failed - Urine Drug Screen completed in last 360 days      Passed - Valid encounter within last 6 months    Recent Outpatient Visits           3 months ago Cough   [facility] Maple Hudson., MD   3 months ago Need for influenza vaccination   [facility] Maple Hudson., MD   5 months ago Chronic pain syndrome   [facility] Maple Hudson., MD   9 months ago Anxiety   [facility] Maple Hudson., MD   10 months ago Anxiety   [facility] Maple Hudson., MD       Future Appointments             In 2 months Maple Hudson., MD [facility]

## 2020-05-06 ENCOUNTER — Encounter: Payer: Self-pay | Admitting: Family Medicine

## 2020-05-08 ENCOUNTER — Ambulatory Visit: Payer: Medicare HMO | Admitting: Adult Health

## 2020-05-11 ENCOUNTER — Other Ambulatory Visit: Payer: Self-pay | Admitting: Family Medicine

## 2020-05-11 DIAGNOSIS — G894 Chronic pain syndrome: Secondary | ICD-10-CM

## 2020-05-11 MED ORDER — OXYCODONE HCL 15 MG PO TABS
15.0000 mg | ORAL_TABLET | Freq: Three times a day (TID) | ORAL | 0 refills | Status: DC | PRN
Start: 2020-05-11 — End: 2020-07-08

## 2020-05-11 NOTE — Telephone Encounter (Signed)
Medication: oxyCODONE (ROXICODONE) 15 MG immediate release tablet  Has the pt contacted their pharmacy? No  Preferred pharmacy: [facility] [place], [place] - [address]  Please be advised refills may take up to 3 business days.  We ask that you follow up with your pharmacy.

## 2020-05-11 NOTE — Telephone Encounter (Signed)
Requested medication (s) are due for refill today: yes  Requested medication (s) are on the active medication list: yes  Last refill: 03/11/2020  Future visit scheduled:yes  Notes to clinic:  this refill cannot be delegated    Requested Prescriptions  Pending Prescriptions Disp Refills   oxyCODONE (ROXICODONE) 15 MG immediate release tablet 80 tablet 0    Sig: Take 1 tablet (15 mg total) by mouth every 8 (eight) hours as needed for pain.      Not Delegated - Analgesics:  Opioid Agonists Failed - 05/11/2020 10:03 AM      Failed - This refill cannot be delegated      Failed - Urine Drug Screen completed in last 360 days      Passed - Valid encounter within last 6 months    Recent Outpatient Visits           3 months ago Cough   [facility] Maple Hudson., MD   4 months ago Need for influenza vaccination   [facility] Maple Hudson., MD   6 months ago Chronic pain syndrome   [facility] Maple Hudson., MD   9 months ago Anxiety   [facility] Maple Hudson., MD   10 months ago Anxiety   [facility] Maple Hudson., MD       Future Appointments             In 1 month Maple Hudson., MD [facility]   In 2 months Maple Hudson., MD [facility], [facility]

## 2020-06-10 ENCOUNTER — Ambulatory Visit: Payer: Medicare HMO | Admitting: Family Medicine

## 2020-06-12 ENCOUNTER — Other Ambulatory Visit: Payer: Self-pay | Admitting: Family Medicine

## 2020-06-12 DIAGNOSIS — F329 Major depressive disorder, single episode, unspecified: Secondary | ICD-10-CM

## 2020-06-12 NOTE — Telephone Encounter (Signed)
Medication Refill - Medication: Generic Ritalin 10 mg  Has the patient contacted their pharmacy? Yes.   (Agent: If no, request that the patient contact the pharmacy for the refill.) (Agent: If yes, when and what did the pharmacy advise?) Preferred Pharmacy (with phone number or street name): [facility]  Agent: Please be advised that RX refills may take up to 3 business days. We ask that you follow-up with your pharmacy.

## 2020-06-12 NOTE — Telephone Encounter (Signed)
Requested medication (s) are due for refill today: yes  Requested medication (s) are on the active medication list: yes    Future visit scheduled: yes  Notes to clinic: this refill cannot be delegated    Requested Prescriptions  Pending Prescriptions Disp Refills   methylphenidate (RITALIN) 10 MG tablet 60 tablet 0    Sig: Take 1 tablet (10 mg total) by mouth 2 (two) times daily. 07/19/16      Not Delegated - Psychiatry:  Stimulants/ADHD Failed - 06/12/2020  9:43 AM      Failed - This refill cannot be delegated      Failed - Urine Drug Screen completed in last 360 days      Failed - Valid encounter within last 3 months    Recent Outpatient Visits           4 months ago Cough   [facility] Maple Hudson., MD   5 months ago Need for influenza vaccination   [facility] Maple Hudson., MD   7 months ago Chronic pain syndrome   [facility] Maple Hudson., MD   10 months ago Anxiety   [facility] Maple Hudson., MD   11 months ago Anxiety   [facility] Maple Hudson., MD       Future Appointments             In 1 month Maple Hudson., MD [facility]

## 2020-06-12 NOTE — Telephone Encounter (Signed)
   Notes to clinic: Patient has appt on 07/23/2020 Review for refill until appt    Requested Prescriptions  Pending Prescriptions Disp Refills   levothyroxine (SYNTHROID) 25 MCG tablet [Pharmacy Med Name: LEVOTHYROXINE 25 MCG TABLET] 90 tablet 1    Sig: TAKE ONE TABLET BY MOUTH DAILY      Endocrinology:  Hypothyroid Agents Failed - 06/12/2020  9:32 AM      Failed - TSH needs to be rechecked within 3 months after an abnormal result. Refill until TSH is due.      Failed - TSH in normal range and within 360 days    TSH  Date Value Ref Range Status  12/28/2018 1.960 0.450 - 4.500 uIU/mL Final          Passed - Valid encounter within last 12 months    Recent Outpatient Visits           4 months ago Cough   [facility] Maple Hudson., MD   5 months ago Need for influenza vaccination   [facility] Maple Hudson., MD   7 months ago Chronic pain syndrome   [facility] Maple Hudson., MD   10 months ago Anxiety   [facility] Maple Hudson., MD   11 months ago Anxiety   [facility] Maple Hudson., MD       Future Appointments             In 1 month Maple Hudson., MD [facility]

## 2020-06-15 DIAGNOSIS — H2513 Age-related nuclear cataract, bilateral: Secondary | ICD-10-CM | POA: Diagnosis not present

## 2020-06-16 MED ORDER — METHYLPHENIDATE HCL 10 MG PO TABS: 10.0000 mg | ORAL_TABLET | Freq: Two times a day (BID) | ORAL | 0 refills | Status: DC

## 2020-06-23 ENCOUNTER — Telehealth: Payer: Self-pay | Admitting: Family Medicine

## 2020-06-23 ENCOUNTER — Ambulatory Visit: Payer: Self-pay

## 2020-06-23 DIAGNOSIS — F330 Major depressive disorder, recurrent, mild: Secondary | ICD-10-CM

## 2020-06-23 NOTE — Telephone Encounter (Signed)
Please advise form. 

## 2020-06-23 NOTE — Progress Notes (Signed)
[employer] Patient Assistance form for Trintellix completed by patient and faxed for review on 06/23/20

## 2020-06-23 NOTE — Telephone Encounter (Signed)
Pt following up on the application for her  vortioxetine HBr (TRINTELLIX) 20 MG TABS tablet.  Pt states [employer] advised her they have NOT received the application or anything from Dr Sullivan Lone.  Pt is getting concerned because she does not want to run out of medication. Pt would appreciate a call when this has been faxed so she will not worry. Ok to leave message. Pt states Okey Regal took her application.

## 2020-06-23 NOTE — Telephone Encounter (Signed)
It has been signed.  Could be on [employer].

## 2020-06-24 NOTE — Telephone Encounter (Signed)
Form was faxed by Trinna Post. Copy of form sent to scan.

## 2020-06-24 NOTE — Telephone Encounter (Signed)
Hannah Vance does have the form. He is going to bring it back to [place] this morning.

## 2020-07-02 ENCOUNTER — Ambulatory Visit: Payer: Self-pay | Admitting: *Deleted

## 2020-07-02 NOTE — Telephone Encounter (Signed)
Pt called in concerned because she took a bite of peanut butter from a jar that has been recalled due to salmonella.   See notes below.  She is not having any symptoms outside of her normal as a result.     I instructed her to observe for diarrhea, vomiting, abd cramps out of her ordinary and give [place] a call if they occur other wise to return the recalled jars to the store for a refund.  She thanked me for my help.   "I have so many health problems I just wanted to be sure I was doing the right things".  Reason for Disposition . MILD-MODERATE diarrhea (e.g., 1-6 times / day more than normal)  Answer Assessment - Initial Assessment Questions 1. DIARRHEA SEVERITY: "How bad is the diarrhea?" "How many more stools have you had in the past 24 hours than normal?"    - NO DIARRHEA (SCALE 0)   - MILD (SCALE 1-3): Few loose or mushy BMs; increase of 1-3 stools over normal daily number of stools; mild increase in ostomy output.   -  MODERATE (SCALE 4-7): Increase of 4-6 stools daily over normal; moderate increase in ostomy output. * SEVERE (SCALE 8-10; OR 'WORST POSSIBLE'): Increase of 7 or more stools daily over normal; moderate increase in ostomy output; incontinence.     I just found out there is a peanut butter recall.   The jar I have is one of the lot numbers.   What do I do? I had a hysterectomy several years ago and it damaged my rectum.   So I have a hard time going to the bathroom.   I have inconsistent BMs due to the spasms.   I have injections for it.  I have double vision so I stay nauseated all the time.    The jar of peanut butter didn't have the normal taste so I threw it out.   It was found to have salmonella.   I just took a bite out of it. 2. ONSET: "When did the diarrhea begin?"      She has diarrhea normally due to damage to her rectum from a hysterectomy years ago. 3. BM CONSISTENCY: "How loose or watery is the diarrhea?"      I had diarrhea a few times during the night last week but  not now. 4. VOMITING: "Are you also vomiting?" If Yes, ask: "How many times in the past 24 hours?"      No  I stay nauseated all the time due to double vision.   I see a neurologist for that.   They don't know why I have double vision. 5. ABDOMINAL PAIN: "Are you having any abdominal pain?" If Yes, ask: "What does it feel like?" (e.g., crampy, dull, intermittent, constant)      No 6. ABDOMINAL PAIN SEVERITY: If present, ask: "How bad is the pain?"  (e.g., Scale 1-10; mild, moderate, or severe)   - MILD (1-3): doesn't interfere with normal activities, abdomen soft and not tender to touch    - MODERATE (4-7): interferes with normal activities or awakens from sleep, abdomen tender to touch    - SEVERE (8-10): excruciating pain, doubled over, unable to do any normal activities       No 7. ORAL INTAKE: If vomiting, "Have you been able to drink liquids?" "How much liquids have you had in the past 24 hours?"     I'm doing fine with my eating and drinking. 8. HYDRATION: "Any  signs of dehydration?" (e.g., dry mouth [not just dry lips], too weak to stand, dizziness, new weight loss) "When did you last urinate?"     No dizziness or weakness as a result of lack of fluids or food. 9. EXPOSURE: "Have you traveled to a foreign country recently?" "Have you been exposed to anyone with diarrhea?" "Could you have eaten any food that was spoiled?"     Ate a bite of peanut butter from a jar that has been recalled due to salmonella.   My jar is one of the lot numbers.   I threw it out anyway because it didn't taste right.   I had another jar too so I'm going to return it to the store for a refund. 10. ANTIBIOTIC USE: "Are you taking antibiotics now or have you taken antibiotics in the past 2 months?"       Not asked 11. OTHER SYMPTOMS: "Do you have any other symptoms?" (e.g., fever, blood in stool)       No 12. PREGNANCY: "Is there any chance you are pregnant?" "When was your last menstrual period?"       NA due to  age  Protocols used: DIARRHEA-A-AH

## 2020-07-08 ENCOUNTER — Other Ambulatory Visit: Payer: Self-pay | Admitting: Family Medicine

## 2020-07-08 DIAGNOSIS — G894 Chronic pain syndrome: Secondary | ICD-10-CM

## 2020-07-08 MED ORDER — OXYCODONE HCL 15 MG PO TABS: 15.0000 mg | ORAL_TABLET | Freq: Three times a day (TID) | ORAL | 0 refills | Status: DC | PRN

## 2020-07-08 NOTE — Telephone Encounter (Signed)
Requested medication (s) are due for refill today: yes  Requested medication (s) are on the active medication list: yes  Last refill:  05/11/2020  Future visit scheduled: yes   Notes to clinic:  this refill cannot be delegated    Requested Prescriptions  Pending Prescriptions Disp Refills   oxyCODONE (ROXICODONE) 15 MG immediate release tablet 80 tablet 0    Sig: Take 1 tablet (15 mg total) by mouth every 8 (eight) hours as needed for pain.      There is no refill protocol information for this order

## 2020-07-08 NOTE — Telephone Encounter (Signed)
Medication Refill - Medication: oxyCODONE (ROXICODONE) 15 MG immediate release tablet    Has the patient contacted their pharmacy? No. (Agent: If no, request that the patient contact the pharmacy for the refill.) (Agent: If yes, when and what did the pharmacy advise?)  Preferred Pharmacy (with phone number or street name):  [facility] [facility] [place], [place] - [address]  [address], [place] [address]  Phone:  [phone] Fax:  [phone]   Agent: Please be advised that RX refills may take up to 3 business days. We ask that you follow-up with your pharmacy. 

## 2020-07-09 DIAGNOSIS — G43109 Migraine with aura, not intractable, without status migrainosus: Secondary | ICD-10-CM | POA: Diagnosis not present

## 2020-07-23 ENCOUNTER — Telehealth: Payer: Self-pay

## 2020-07-23 ENCOUNTER — Encounter: Payer: Self-pay | Admitting: Family Medicine

## 2020-07-23 NOTE — Telephone Encounter (Signed)
Copied from CRM [id number]. Topic: General - Other >> Jul 23, 2020  1:07 PM Jaquita Rector A wrote: Reason for CRM: Patient called in to inform Dr Sullivan Lone that she is very sorry to have to cancel her appointment but she is having a migraine and is seeing spots and bright light. Wanted to let Dr Sullivan Lone also know she saw the Neurologist but will call back to reschedule when feeling better. Please be advised

## 2020-08-13 ENCOUNTER — Other Ambulatory Visit: Payer: Self-pay | Admitting: Family Medicine

## 2020-08-13 DIAGNOSIS — F329 Major depressive disorder, single episode, unspecified: Secondary | ICD-10-CM

## 2020-08-13 NOTE — Telephone Encounter (Signed)
  Last refill:  04/29/2020  Future visit scheduled: yes  Notes to clinic:  refills cannot be delegated  Patient states that Sumatriptan has been increased    Requested Prescriptions  Pending Prescriptions Disp Refills   methylphenidate (RITALIN) 10 MG tablet 60 tablet 0    Sig: Take 1 tablet (10 mg total) by mouth 2 (two) times daily. 07/19/16      Not Delegated - Psychiatry:  Stimulants/ADHD Failed - 08/13/2020  9:26 AM      Failed - This refill cannot be delegated      Failed - Urine Drug Screen completed in last 360 days      Failed - Valid encounter within last 3 months    Recent Outpatient Visits           6 months ago Cough   [facility] Maple Hudson., MD   7 months ago Need for influenza vaccination   [facility] Maple Hudson., MD   9 months ago Chronic pain syndrome   [facility] Maple Hudson., MD   1 year ago Anxiety   [facility] Maple Hudson., MD   1 year ago Anxiety   [facility] Maple Hudson., MD       Future Appointments             In 2 weeks Maple Hudson., MD [facility]   In 4 months Maple Hudson., MD [facility]               clonazePAM (KLONOPIN) 0.5 MG tablet 30 tablet 1    Sig: Take 1 tablet (0.5 mg total) by mouth at bedtime as needed.      Not Delegated - Psychiatry:  Anxiolytics/Hypnotics Failed - 08/13/2020  9:26 AM      Failed - This refill cannot be delegated      Failed - Urine Drug Screen completed in last 360 days      Failed - Valid encounter within last 6 months    Recent Outpatient Visits           6 months ago Cough   [facility] Maple Hudson., MD   7 months ago Need for influenza vaccination   [facility] Maple Hudson., MD   9 months ago Chronic pain syndrome   [facility] Maple Hudson., MD   1 year ago Anxiety   [facility] Maple Hudson., MD   1 year ago Anxiety   [facility] Maple Hudson., MD       Future Appointments             In 2 weeks Maple Hudson., MD [facility]   In 4 months Maple Hudson., MD [facility]

## 2020-08-13 NOTE — Telephone Encounter (Signed)
Medication Refill - Medication: Methylphenidate, Clonazepam, Sumatriptan   Has the patient contacted their pharmacy? No. Pt states that she always has to call in these medications. Pt states that her neurologist has changed her Sumatriptan to 100 mg. Pt states that she has an appt on 08/27/20. Please advise.   (Agent: If no, request that the patient contact the pharmacy for the refill.) (Agent: If yes, when and what did the pharmacy advise?)  Preferred Pharmacy (with phone number or street name):  [id number] [place], [place] - [address] [place] [address]  Phone: [phone] Fax: [phone]  Hours: Not open 24 hours    Agent: Please be advised that RX refills may take up to 3 business days. We ask that you follow-up with your pharmacy.

## 2020-08-14 DIAGNOSIS — R198 Other specified symptoms and signs involving the digestive system and abdomen: Secondary | ICD-10-CM | POA: Diagnosis not present

## 2020-08-15 MED ORDER — METHYLPHENIDATE HCL 10 MG PO TABS: 10.0000 mg | ORAL_TABLET | Freq: Two times a day (BID) | ORAL | 0 refills | Status: DC

## 2020-08-15 MED ORDER — CLONAZEPAM 0.5 MG PO TABS: 0.5000 mg | ORAL_TABLET | Freq: Every evening | ORAL | 1 refills | Status: DC | PRN

## 2020-08-25 ENCOUNTER — Telehealth: Payer: Self-pay

## 2020-08-25 NOTE — Telephone Encounter (Signed)
There are already labs pending from last visit that patient did not complete.

## 2020-08-25 NOTE — Telephone Encounter (Signed)
Copied from CRM [id number]. Topic: General - Other >> Aug 25, 2020 10:20 AM Jaquita Rector A wrote: Reason for CRM: Patient called in to ask Dr Sullivan Lone if he will have her doing blood work at her visit she would like to come in at least an hour before her visit to pick up the lab requisition form go have her blood work done and come back. Please call patient with an answer  Ph# [phone]

## 2020-08-27 ENCOUNTER — Other Ambulatory Visit: Payer: Self-pay

## 2020-08-27 ENCOUNTER — Encounter: Payer: Self-pay | Admitting: Family Medicine

## 2020-08-27 ENCOUNTER — Telehealth: Payer: Self-pay

## 2020-08-27 ENCOUNTER — Ambulatory Visit (INDEPENDENT_AMBULATORY_CARE_PROVIDER_SITE_OTHER): Payer: Medicare HMO | Admitting: Family Medicine

## 2020-08-27 VITALS — BP 158/94 | HR 117 | Temp 99.4°F | Resp 20 | Ht 62.0 in | Wt 166.0 lb

## 2020-08-27 DIAGNOSIS — F419 Anxiety disorder, unspecified: Secondary | ICD-10-CM | POA: Diagnosis not present

## 2020-08-27 DIAGNOSIS — M6289 Other specified disorders of muscle: Secondary | ICD-10-CM

## 2020-08-27 DIAGNOSIS — G894 Chronic pain syndrome: Secondary | ICD-10-CM | POA: Diagnosis not present

## 2020-08-27 DIAGNOSIS — E039 Hypothyroidism, unspecified: Secondary | ICD-10-CM | POA: Diagnosis not present

## 2020-08-27 DIAGNOSIS — G8929 Other chronic pain: Secondary | ICD-10-CM

## 2020-08-27 DIAGNOSIS — M5432 Sciatica, left side: Secondary | ICD-10-CM | POA: Diagnosis not present

## 2020-08-27 DIAGNOSIS — G629 Polyneuropathy, unspecified: Secondary | ICD-10-CM

## 2020-08-27 DIAGNOSIS — F331 Major depressive disorder, recurrent, moderate: Secondary | ICD-10-CM

## 2020-08-27 DIAGNOSIS — R69 Illness, unspecified: Secondary | ICD-10-CM | POA: Diagnosis not present

## 2020-08-27 DIAGNOSIS — M25561 Pain in right knee: Secondary | ICD-10-CM

## 2020-08-27 MED ORDER — BUSPIRONE HCL 10 MG PO TABS: 10.0000 mg | ORAL_TABLET | Freq: Three times a day (TID) | ORAL | 2 refills | Status: DC

## 2020-08-27 MED ORDER — PREDNISONE 5 MG (21) PO TBPK: ORAL_TABLET | ORAL | 0 refills | Status: DC

## 2020-08-27 NOTE — Telephone Encounter (Signed)
Copied from CRM [id number]. Topic: General - Other >> Aug 27, 2020  3:15 PM Wyonia Hough E wrote: Reason for CRM: Pt called to let Dr. Sullivan Lone know that she would like him to schedule the knee Xray/ she told him she would think about it and call him back to let him know if she wanted to schedule for the Xray / please advise

## 2020-08-27 NOTE — Progress Notes (Signed)
Established patient visit   Patient: Hannah Vance   DOB: [birth year]   65 y.o. Female  MRN: [id number] Visit Date: 08/27/2020  Today's healthcare provider: [name], MD   Chief Complaint  Patient presents with   Back Pain   Subjective    Back Pain The current episode started in the past 7 days. The problem occurs constantly. The problem has been gradually worsening since onset. The pain is present in the lumbar spine. The quality of the pain is described as aching, burning, shooting and stabbing. The pain is severe. The pain is The same all the time. The symptoms are aggravated by standing and bending. Associated symptoms include headaches, leg pain, numbness, paresthesias, pelvic pain, tingling and weakness. She has tried ice, muscle relaxant, heat and bed rest for the symptoms. The treatment provided mild relief.   Patient is having difficulty bearing weight on her left leg because of ongoing leg pain.  All of the is related to the stress of caring for her mother who is in the home dying.  She has severe anxiety.  She has not been taking her BuSpar for the anxiety. The pain in her legs seems to be above and below the knee more with weightbearing than anything else.  This is never been investigated and when I suggested getting an x-ray she says she cannot afford it.      Medications: Outpatient Medications Prior to Visit  Medication Sig   albuterol (VENTOLIN HFA) 108 (90 Base) MCG/ACT inhaler Inhale 2 puffs into the lungs every 4 (four) hours as needed for wheezing or shortness of breath.   azithromycin (ZITHROMAX) 250 MG tablet UAD   busPIRone (BUSPAR) 10 MG tablet TAKE 1 TABLET BY MOUTH THREE TIMES A DAY   clonazePAM (KLONOPIN) 0.5 MG tablet Take 1 tablet (0.5 mg total) by mouth at bedtime as needed.   cyclobenzaprine (FLEXERIL) 5 MG tablet Take 1 tablet (5 mg total) by mouth 3 (three) times daily as needed for muscle spasms.   fluticasone (FLONASE) 50 MCG/ACT nasal  spray Place 2 sprays into both nostrils daily.   Green Tea, Camellia sinensis, (GREEN TEA PO) Take by mouth.   levothyroxine (SYNTHROID) 25 MCG tablet TAKE ONE TABLET BY MOUTH DAILY   loratadine (CLARITIN) 10 MG tablet Take 1 tablet (10 mg total) by mouth daily.   methylphenidate (RITALIN) 10 MG tablet Take 1 tablet (10 mg total) by mouth 2 (two) times daily. 07/19/16   oxyCODONE (ROXICODONE) 15 MG immediate release tablet Take 1 tablet (15 mg total) by mouth every 8 (eight) hours as needed for pain.   aspirin 81 MG EC tablet Take by mouth. (Patient not taking: No sig reported)   Oxycodone HCl 10 MG TABS Take 1 tablet (10 mg total) by mouth 4 (four) times daily as needed. (Patient not taking: Reported on 01/22/2020)   SUMAtriptan (IMITREX) 50 MG tablet Take 1 tablet (50 mg total) by mouth once for 1 dose. May repeat in 2 hours if headache persists or recurs.   tamsulosin (FLOMAX) 0.4 MG CAPS capsule Take 1 capsule (0.4 mg total) by mouth daily.   vortioxetine HBr (TRINTELLIX) 20 MG TABS tablet Take 1 tablet (20 mg total) by mouth daily.   No facility-administered medications prior to visit.    Review of Systems  Genitourinary:  Positive for pelvic pain.  Musculoskeletal:  Positive for back pain.  Neurological:  Positive for tingling, weakness, numbness, headaches and paresthesias.  Objective    BP (!) 158/94   Pulse (!) 117   Temp 99.4 F (37.4 C)   Resp 20   Ht 5\' 2"  (1.575 m)   Wt 166 lb (75.3 kg)   BMI 30.36 kg/m  BP Readings from Last 3 Encounters:  08/27/20 (!) 158/94  01/06/20 120/78  11/04/19 106/67   Wt Readings from Last 3 Encounters:  08/27/20 166 lb (75.3 kg)  01/06/20 179 lb (81.2 kg)  11/04/19 175 lb (79.4 kg)       Physical Exam Vitals reviewed.  Constitutional:      General: She is in acute distress.     Appearance: Normal appearance. She is well-developed. She is not diaphoretic.  HENT:     Head: Normocephalic and atraumatic.  Eyes:      General: No scleral icterus.    Conjunctiva/sclera: Conjunctivae normal.  Neck:     Thyroid: No thyromegaly.  Cardiovascular:     Rate and Rhythm: Normal rate and regular rhythm.     Pulses: Normal pulses.     Heart sounds: Normal heart sounds. No murmur heard. Pulmonary:     Effort: Pulmonary effort is normal. No respiratory distress.     Breath sounds: Normal breath sounds. No wheezing, rhonchi or rales.  Musculoskeletal:     Cervical back: Neck supple.     Right lower leg: No edema.     Left lower leg: No edema.  Lymphadenopathy:     Cervical: No cervical adenopathy.  Skin:    General: Skin is warm and dry.     Findings: No rash.  Neurological:     Mental Status: She is alert and oriented to person, place, and time. Mental status is at baseline.  Psychiatric:        Mood and Affect: Mood normal.        Behavior: Behavior normal.        Thought Content: Thought content normal.        Judgment: Judgment normal.      No results found for any visits on 08/27/20.  Assessment & Plan     1. Anxiety Restart BuSpar at 10 mg 3 times daily which is helped in the past. - busPIRone (BUSPAR) 10 MG tablet; Take 1 tablet (10 mg total) by mouth 3 (three) times daily.  Dispense: 270 tablet; Refill: 2  2. Chronic pain syndrome I will not increase in the dose of her pain medication.  I am not comfortable with that.  She is followed by [facility] for this who are treating her pelvic pain.  She may need referral to the formal pain clinic.  3. Sciatica of left side Will treat with prednisone 10 mg 6-day taper.  Hopefully this will help.  I think part of her pain may be knee pain.  Recommended x-rays of the left knee. - predniSONE (STERAPRED UNI-PAK 21 TAB) 5 MG (21) TBPK tablet; Taper as directed.  Dispense: 21 tablet; Refill: 0  4. Hypothyroidism (acquired)   5. Neuropathy Consider gabapentin or Lyrica.  6. Pelvic floor dysfunction Followed at [facility]  7. Moderate episode of recurrent  major depressive disorder (HCC) Continue Trintellix and Ritalin.  I think she needs to see psychiatry again in the future. At the very least she would benefit from counseling.  She is incredibly anxious.   No follow-ups on file.      I, [facility], MD, have reviewed all documentation for this visit. The documentation on 09/01/20 for the exam,  diagnosis, procedures, and orders are all accurate and complete.    Marten Iles Cranford Mon, MD  [facility] [phone] (phone) [phone] (fax)  [place]

## 2020-08-27 NOTE — Telephone Encounter (Signed)
Please advise. Thanks.  

## 2020-08-28 NOTE — Telephone Encounter (Signed)
Ordered. Please sign order. Thanks!

## 2020-08-28 NOTE — Addendum Note (Signed)
Addended by: Anson [place] on: 08/28/2020 10:38 AM   Modules accepted: Orders

## 2020-09-03 ENCOUNTER — Other Ambulatory Visit: Payer: Self-pay | Admitting: *Deleted

## 2020-09-03 ENCOUNTER — Ambulatory Visit
Admission: RE | Admit: 2020-09-03 | Discharge: 2020-09-03 | Disposition: A | Discharge status: Home / Self Care | Payer: Medicare HMO | Source: Ambulatory Visit | Attending: Family Medicine | Admitting: Family Medicine

## 2020-09-03 ENCOUNTER — Ambulatory Visit
Admission: RE | Admit: 2020-09-03 | Discharge: 2020-09-03 | Disposition: A | Discharge status: Home / Self Care | Payer: Medicare HMO | Attending: Family Medicine | Admitting: Family Medicine

## 2020-09-03 DIAGNOSIS — G8929 Other chronic pain: Secondary | ICD-10-CM | POA: Insufficient documentation

## 2020-09-03 DIAGNOSIS — M25562 Pain in left knee: Secondary | ICD-10-CM | POA: Diagnosis not present

## 2020-09-03 DIAGNOSIS — M1712 Unilateral primary osteoarthritis, left knee: Secondary | ICD-10-CM | POA: Insufficient documentation

## 2020-09-08 ENCOUNTER — Telehealth: Payer: Self-pay | Admitting: Family Medicine

## 2020-09-08 NOTE — Telephone Encounter (Signed)
LMOVM for pt to return call. Okay for [facility] triage to give patient results.

## 2020-09-08 NOTE — Telephone Encounter (Signed)
Please review for Dr. Gilbert  Thanks,   -Laresa Oshiro  

## 2020-09-08 NOTE — Telephone Encounter (Signed)
Xray shows moderate arthritis of the knee. Can refer to orthopedics if she doesn't already have one.

## 2020-09-08 NOTE — Telephone Encounter (Signed)
Patient inquiring about 09/03/2020 imaging results and would like a call back, please leave a detail message at [phone]

## 2020-09-08 NOTE — Telephone Encounter (Signed)
Pt given xray results per notes of Dr. Sherrie Mustache on 09/08/20. Pt verbalized understanding. Pt does not want a orthopedic referral at this time.  Routing back to [facility].

## 2020-09-11 ENCOUNTER — Other Ambulatory Visit: Payer: Self-pay | Admitting: Family Medicine

## 2020-09-11 DIAGNOSIS — E039 Hypothyroidism, unspecified: Secondary | ICD-10-CM

## 2020-09-11 DIAGNOSIS — G894 Chronic pain syndrome: Secondary | ICD-10-CM

## 2020-09-11 NOTE — Telephone Encounter (Signed)
Copied from CRM [id number]. Topic: Quick Communication - Rx Refill/Question >> Sep 11, 2020  4:17 PM Izora Ribas, Everette A wrote: Medication: oxyCODONE (ROXICODONE) 15 MG immediate release tablet   levothyroxine (SYNTHROID) 25 MCG tablet   Has the patient contacted their pharmacy? No. (Agent: If no, request that the patient contact the pharmacy for the refill.) (Agent: If yes, when and what did the pharmacy advise?)  Preferred Pharmacy (with phone number or street name): [id number] [place], [place] - [address]  Phone:  [phone] Fax:  [phone]  Agent: Please be advised that RX refills may take up to 3 business days. We ask that you follow-up with your pharmacy.

## 2020-09-11 NOTE — Telephone Encounter (Signed)
Requested medications are due for refill today yes  Requested medications are on the active medication list yes  Last visit 08/27/20  Future visit scheduled 12/15/20  Notes to clinic Synthroid failed protocol due to labs more than 13 days old, 12/2018. Oxycodone not delegated.

## 2020-09-16 ENCOUNTER — Other Ambulatory Visit: Payer: Self-pay | Admitting: *Deleted

## 2020-09-16 DIAGNOSIS — G8929 Other chronic pain: Secondary | ICD-10-CM

## 2020-09-16 DIAGNOSIS — M25562 Pain in left knee: Secondary | ICD-10-CM

## 2020-09-16 MED ORDER — LEVOTHYROXINE SODIUM 25 MCG PO TABS: 25.0000 ug | ORAL_TABLET | Freq: Every day | ORAL | 1 refills | Status: DC

## 2020-09-16 MED ORDER — OXYCODONE HCL 15 MG PO TABS: 15.0000 mg | ORAL_TABLET | Freq: Three times a day (TID) | ORAL | 0 refills | Status: DC | PRN

## 2020-09-16 NOTE — Telephone Encounter (Signed)
Pt calling in regarding these medications. She states that she only has one of these left. Pt is also requesting to have her clonazepam refilled. Please advise.

## 2020-09-17 ENCOUNTER — Telehealth: Payer: Self-pay

## 2020-09-17 NOTE — Telephone Encounter (Signed)
Copied from CRM [id number]. Topic: General - Other >> Sep 17, 2020  9:43 AM Gwenlyn Fudge wrote: Reason for CRM: Pt called and is requesting to have referral to orthopedic doctor sent to [facility] clinic with Dr. Ernest Pine. She states that she receives financial assistance with [facility] and not with [facility]. Please advise.

## 2020-09-17 NOTE — Telephone Encounter (Signed)
Please advise 

## 2020-09-17 NOTE — Addendum Note (Signed)
Addended by: Marlene Lard on: 09/17/2020 01:58 PM   Modules accepted: Orders

## 2020-09-17 NOTE — Telephone Encounter (Signed)
Pt called and is requesting to have her clonazepam refilled as well. Please advise.

## 2020-09-21 MED ORDER — CLONAZEPAM 0.5 MG PO TABS: 0.5000 mg | ORAL_TABLET | Freq: Every evening | ORAL | 1 refills | Status: DC | PRN

## 2020-09-25 NOTE — Telephone Encounter (Signed)
Pt has been advised that referral has been sent but [facility] states they do not accept [facility] financial assistance. I have explained this to pt.I asked her to call their office and talk to them concerning this

## 2020-09-28 ENCOUNTER — Other Ambulatory Visit: Payer: Self-pay | Admitting: Family Medicine

## 2020-09-28 DIAGNOSIS — F329 Major depressive disorder, single episode, unspecified: Secondary | ICD-10-CM

## 2020-09-28 NOTE — Telephone Encounter (Signed)
Copied from CRM [id number]. Topic: Quick Communication - Rx Refill/Question >> Sep 28, 2020 12:47 PM Marylen Ponto wrote: Medication: methylphenidate (RITALIN) 10 MG tablet  Has the patient contacted their pharmacy? Yes.   (Agent: If no, request that the patient contact the pharmacy for the refill.) (Agent: If yes, when and what did the pharmacy advise?)  Preferred Pharmacy (with phone number or street name): [facility] [id number] [place], [place] - [address] Phone: [phone]  Fax: [phone]  Agent: Please be advised that RX refills may take up to 3 business days. We ask that you follow-up with your pharmacy.

## 2020-10-01 MED ORDER — METHYLPHENIDATE HCL 10 MG PO TABS: 10.0000 mg | ORAL_TABLET | Freq: Two times a day (BID) | ORAL | 0 refills | Status: DC

## 2020-10-06 DIAGNOSIS — S86912A Strain of unspecified muscle(s) and tendon(s) at lower leg level, left leg, initial encounter: Secondary | ICD-10-CM | POA: Diagnosis not present

## 2020-10-14 DIAGNOSIS — S86912A Strain of unspecified muscle(s) and tendon(s) at lower leg level, left leg, initial encounter: Secondary | ICD-10-CM | POA: Diagnosis not present

## 2020-10-26 DIAGNOSIS — M1712 Unilateral primary osteoarthritis, left knee: Secondary | ICD-10-CM | POA: Diagnosis not present

## 2020-11-18 ENCOUNTER — Other Ambulatory Visit: Payer: Self-pay | Admitting: Family Medicine

## 2020-11-18 DIAGNOSIS — G894 Chronic pain syndrome: Secondary | ICD-10-CM

## 2020-11-18 NOTE — Telephone Encounter (Signed)
Requested medications are due for refill today.  yes  Requested medications are on the active medications list.  yes  Last refill. 09/16/2020  Future visit scheduled.   yes  Notes to clinic.  Medication not delegated.

## 2020-11-18 NOTE — Telephone Encounter (Signed)
Copied from CRM [id number]. Topic: Quick Communication - Rx Refill/Question >> Nov 18, 2020  9:39 AM Marylen Ponto wrote: Medication: oxyCODONE (ROXICODONE) 15 MG immediate release tablet  Has the patient contacted their pharmacy? Yes.  Pt told to contact provider (Agent: If no, request that the patient contact the pharmacy for the refill.) (Agent: If yes, when and what did the pharmacy advise?)  Preferred Pharmacy (with phone number or street name): [facility] [address] [facility] - [place], [place] - [address]  Phone: [phone]   Fax: [phone]  Has the patient been seen for an appointment in the last year OR does the patient have an upcoming appointment? Yes.    Agent: Please be advised that RX refills may take up to 3 business days. We ask that you follow-up with your pharmacy.

## 2020-11-20 NOTE — Telephone Encounter (Signed)
Pt called stating that she is almost out. She states that she only has 2 more pills left and is requesting to have these sent in before the weekend. Please advise.

## 2020-11-23 MED ORDER — OXYCODONE HCL 15 MG PO TABS: 15.0000 mg | ORAL_TABLET | Freq: Three times a day (TID) | ORAL | 0 refills | Status: DC | PRN

## 2020-11-23 NOTE — Telephone Encounter (Signed)
Pt called she needs the oxycodone really bad.  She said she called Friday and Wednesday.   CB#  [phone]  She uses [facility]

## 2020-12-07 ENCOUNTER — Other Ambulatory Visit: Payer: Self-pay | Admitting: Family Medicine

## 2020-12-07 DIAGNOSIS — F329 Major depressive disorder, single episode, unspecified: Secondary | ICD-10-CM

## 2020-12-07 NOTE — Telephone Encounter (Signed)
Requested medication (s) are due for refill today - yes  Requested medication (s) are on the active medication list- yes  Future visit scheduled -yes  Last refill: 10/01/20 #60  Notes to clinic: Request RF: non delegated Rx  Requested Prescriptions  Pending Prescriptions Disp Refills   methylphenidate (RITALIN) 10 MG tablet 60 tablet 0    Sig: Take 1 tablet (10 mg total) by mouth 2 (two) times daily. 07/19/16     Not Delegated - Psychiatry:  Stimulants/ADHD Failed - 12/07/2020 10:07 AM      Failed - This refill cannot be delegated      Failed - Urine Drug Screen completed in last 360 days      Failed - Valid encounter within last 3 months    Recent Outpatient Visits           3 months ago Chronic pain syndrome   [facility] Maple Hudson., MD   10 months ago Cough   [facility] Maple Hudson., MD   11 months ago Need for influenza vaccination   [facility] Maple Hudson., MD   1 year ago Chronic pain syndrome   [facility] Maple Hudson., MD   1 year ago Anxiety   [facility] Maple Hudson., MD       Future Appointments             In 1 week Maple Hudson., MD [facility]               Requested Prescriptions  Pending Prescriptions Disp Refills   methylphenidate (RITALIN) 10 MG tablet 60 tablet 0    Sig: Take 1 tablet (10 mg total) by mouth 2 (two) times daily. 07/19/16     Not Delegated - Psychiatry:  Stimulants/ADHD Failed - 12/07/2020 10:07 AM      Failed - This refill cannot be delegated      Failed - Urine Drug Screen completed in last 360 days      Failed - Valid encounter within last 3 months    Recent Outpatient Visits           3 months ago Chronic pain syndrome   [facility] Maple Hudson., MD   10 months ago Cough   [facility] Maple Hudson., MD   11  months ago Need for influenza vaccination   [facility] Maple Hudson., MD   1 year ago Chronic pain syndrome   [facility] Maple Hudson., MD   1 year ago Anxiety   [facility] Maple Hudson., MD       Future Appointments             In 1 week Maple Hudson., MD [facility]

## 2020-12-07 NOTE — Telephone Encounter (Signed)
Copied from CRM [id number]. Topic: Quick Communication - Rx Refill/Question >> Dec 07, 2020  8:19 AM Jaquita Rector A wrote: Medication: methylphenidate (RITALIN) 10 MG tablet   Has the patient contacted their pharmacy? No. Due to previously being informed to contact office with controlled substance she call straight  (Agent: If no, request that the patient contact the pharmacy for the refill. If patient does not wish to contact the pharmacy document the reason why and proceed with request.) (Agent: If yes, when and what did the pharmacy advise?)  Preferred Pharmacy (with phone number or street name): [facility] [address] [facility] - [place], [place] - [address]  Phone:  [phone] Fax:  [phone]    Has the patient been seen for an appointment in the last year OR does the patient have an upcoming appointment? Yes.    Agent: Please be advised that RX refills may take up to 3 business days. We ask that you follow-up with your pharmacy.

## 2020-12-08 MED ORDER — METHYLPHENIDATE HCL 10 MG PO TABS: 10.0000 mg | ORAL_TABLET | Freq: Two times a day (BID) | ORAL | 0 refills | Status: DC

## 2020-12-15 ENCOUNTER — Other Ambulatory Visit: Payer: Self-pay

## 2020-12-15 ENCOUNTER — Ambulatory Visit (INDEPENDENT_AMBULATORY_CARE_PROVIDER_SITE_OTHER): Payer: Medicare HMO | Admitting: Family Medicine

## 2020-12-15 ENCOUNTER — Encounter: Payer: Self-pay | Admitting: Family Medicine

## 2020-12-15 VITALS — BP 119/79 | HR 86 | Temp 98.6°F | Resp 16 | Ht 62.0 in | Wt 164.0 lb

## 2020-12-15 DIAGNOSIS — E7800 Pure hypercholesterolemia, unspecified: Secondary | ICD-10-CM

## 2020-12-15 DIAGNOSIS — M25562 Pain in left knee: Secondary | ICD-10-CM | POA: Diagnosis not present

## 2020-12-15 DIAGNOSIS — Z0000 Encounter for general adult medical examination without abnormal findings: Secondary | ICD-10-CM

## 2020-12-15 DIAGNOSIS — G629 Polyneuropathy, unspecified: Secondary | ICD-10-CM | POA: Diagnosis not present

## 2020-12-15 DIAGNOSIS — N189 Chronic kidney disease, unspecified: Secondary | ICD-10-CM | POA: Diagnosis not present

## 2020-12-15 DIAGNOSIS — E039 Hypothyroidism, unspecified: Secondary | ICD-10-CM | POA: Diagnosis not present

## 2020-12-15 DIAGNOSIS — M25561 Pain in right knee: Secondary | ICD-10-CM

## 2020-12-15 DIAGNOSIS — R69 Illness, unspecified: Secondary | ICD-10-CM | POA: Diagnosis not present

## 2020-12-15 DIAGNOSIS — G8929 Other chronic pain: Secondary | ICD-10-CM

## 2020-12-15 DIAGNOSIS — Z23 Encounter for immunization: Secondary | ICD-10-CM

## 2020-12-15 DIAGNOSIS — K219 Gastro-esophageal reflux disease without esophagitis: Secondary | ICD-10-CM | POA: Diagnosis not present

## 2020-12-15 DIAGNOSIS — F331 Major depressive disorder, recurrent, moderate: Secondary | ICD-10-CM

## 2020-12-15 MED ORDER — MELOXICAM 7.5 MG PO TABS: 7.5000 mg | ORAL_TABLET | Freq: Two times a day (BID) | ORAL | 1 refills | Status: DC | PRN

## 2020-12-15 NOTE — Progress Notes (Signed)
I,Hannah Vance,acting as a scribe for Wilhemena Durie, MD.,have documented all relevant documentation on the behalf of Wilhemena Durie, MD,as directed by  Wilhemena Durie, MD while in the presence of Wilhemena Durie, MD.   Complete physical exam   Patient: Hannah Vance   DOB: [birth year]   65 y.o. Female  MRN: [id number] Visit Date: 12/15/2020  Today's healthcare provider: Wilhemena Durie, MD   Chief Complaint  Patient presents with   Annual Exam   Subjective    Hannah Vance is a 65 y.o. female who presents today for a complete physical exam.  She reports consuming a general diet. The patient does not participate in regular exercise at present. She generally feels fairly well. She reports sleeping poorly. She does not have additional problems to discuss today.  Her mother passed away 6 weeks ago and this is been a relief from her daily duties.  She is obviously mourning the loss of her mother. HPI  She is followed at [facility] for well woman care Failed gabapentin for chronic pain. Wishes to try Mobic again and she is also taking oxycodone 10 twice daily as needed. We have discussed  trying to avoid narcotics when at all possible  History reviewed. No pertinent past medical history. Past Surgical History:  Procedure Laterality Date   ABDOMINAL HYSTERECTOMY     CERVICAL DISCECTOMY     C5-C6-fusion and plating in the neck   TONSILLECTOMY     Social History   Socioeconomic History   Marital status: Divorced    Spouse name: Not on file   Number of children: Not on file   Years of education: Not on file   Highest education level: Not on file  Occupational History   Not on file  Tobacco Use   Smoking status: Never   Smokeless tobacco: Never  Substance and Sexual Activity   Alcohol use: No    Alcohol/week: 0.0 standard drinks   Drug use: No   Sexual activity: Never  Other Topics Concern   Not on file  Social History Narrative   Not on file   Social  Determinants of Health   Financial Resource Strain: Not on file  Food Insecurity: Not on file  Transportation Needs: Not on file  Physical Activity: Not on file  Stress: Not on file  Social Connections: Not on file  Intimate Partner Violence: Not on file   Family Status  Relation Name Status   Mother  Alive   Father  Alive   Sister  Alive   Brother  Alive   Sister  Alive   Family History  Problem Relation Age of Onset   Arthritis Mother    Stroke Mother    Polycystic kidney disease Mother    Heart attack Father    Dementia Father    Hypertension Father    Aneurysm Father    Allergies  Allergen Reactions   Amoxicillin Other (See Comments)   Morphine Other (See Comments)    Trouble walking, heavy legs   Quetiapine Fumarate Other (See Comments)    Mouth and tongue numbness    Septra  [Sulfamethoxazole-Trimethoprim]    Sulfa Antibiotics Other (See Comments)    burning   Methadone Nausea And Vomiting    Patient Care Team: Jerrol Banana., MD as PCP - General (Family Medicine)   Medications: Outpatient Medications Prior to Visit  Medication Sig   clonazePAM (KLONOPIN) 0.5 MG tablet Take 1 tablet (0.5  mg total) by mouth at bedtime as needed.   cyclobenzaprine (FLEXERIL) 5 MG tablet Take 1 tablet (5 mg total) by mouth 3 (three) times daily as needed for muscle spasms.   fluticasone (FLONASE) 50 MCG/ACT nasal spray Place 2 sprays into both nostrils daily.   Green Tea, Camellia sinensis, (GREEN TEA PO) Take by mouth.   levothyroxine (SYNTHROID) 25 MCG tablet Take 1 tablet (25 mcg total) by mouth daily.   loratadine (CLARITIN) 10 MG tablet Take 1 tablet (10 mg total) by mouth daily.   methylphenidate (RITALIN) 10 MG tablet Take 1 tablet (10 mg total) by mouth 2 (two) times daily. 07/19/16   oxyCODONE (ROXICODONE) 15 MG immediate release tablet Take 1 tablet (15 mg total) by mouth every 8 (eight) hours as needed for pain.   SUMAtriptan (IMITREX) 50 MG tablet Take 1  tablet (50 mg total) by mouth once for 1 dose. May repeat in 2 hours if headache persists or recurs.   vortioxetine HBr (TRINTELLIX) 20 MG TABS tablet Take 1 tablet (20 mg total) by mouth daily.   albuterol (VENTOLIN HFA) 108 (90 Base) MCG/ACT inhaler Inhale 2 puffs into the lungs every 4 (four) hours as needed for wheezing or shortness of breath. (Patient not taking: Reported on 12/15/2020)   busPIRone (BUSPAR) 10 MG tablet Take 1 tablet (10 mg total) by mouth 3 (three) times daily. (Patient not taking: Reported on 12/15/2020)   tamsulosin (FLOMAX) 0.4 MG CAPS capsule Take 1 capsule (0.4 mg total) by mouth daily. (Patient not taking: Reported on 12/15/2020)   [DISCONTINUED] aspirin 81 MG EC tablet Take by mouth. (Patient not taking: No sig reported)   [DISCONTINUED] azithromycin (ZITHROMAX) 250 MG tablet UAD (Patient not taking: Reported on 12/15/2020)   [DISCONTINUED] Oxycodone HCl 10 MG TABS Take 1 tablet (10 mg total) by mouth 4 (four) times daily as needed. (Patient not taking: No sig reported)   [DISCONTINUED] predniSONE (STERAPRED UNI-PAK 21 TAB) 5 MG (21) TBPK tablet Taper as directed. (Patient not taking: Reported on 12/15/2020)   No facility-administered medications prior to visit.    Review of Systems  Constitutional:  Positive for fatigue.  HENT:  Positive for tinnitus.   Eyes:  Positive for photophobia and visual disturbance.  Gastrointestinal:  Positive for nausea.  Genitourinary:  Positive for difficulty urinating and dysuria.  Musculoskeletal:  Positive for arthralgias, back pain, myalgias and neck pain.  Allergic/Immunologic: Positive for environmental allergies.  Neurological:  Positive for dizziness, numbness and headaches.  Psychiatric/Behavioral:  Positive for sleep disturbance. The patient is nervous/anxious.   All other systems reviewed and are negative.    Objective    BP 119/79 (BP Location: Left Arm, Patient Position: Sitting, Cuff Size: Normal)   Pulse 86   Temp  98.6 F (37 C) (Temporal)   Resp 16   Ht 5\' 2"  (1.575 m)   Wt 164 lb (74.4 kg)   SpO2 99%   BMI 30.00 kg/m    Physical Exam Constitutional:      Appearance: Normal appearance. She is normal weight.  HENT:     Head: Normocephalic and atraumatic.     Right Ear: Tympanic membrane, ear canal and external ear normal.     Left Ear: Tympanic membrane, ear canal and external ear normal.     Nose: Nose normal.     Mouth/Throat:     Mouth: Mucous membranes are moist.     Pharynx: Oropharynx is clear.  Eyes:     Extraocular Movements: Extraocular movements  intact.     Conjunctiva/sclera: Conjunctivae normal.     Pupils: Pupils are equal, round, and reactive to light.  Cardiovascular:     Rate and Rhythm: Normal rate and regular rhythm.     Pulses: Normal pulses.     Heart sounds: Normal heart sounds.  Pulmonary:     Effort: Pulmonary effort is normal.     Breath sounds: Normal breath sounds.  Abdominal:     General: Bowel sounds are normal.     Palpations: Abdomen is soft.  Musculoskeletal:     Cervical back: Neck supple.  Skin:    General: Skin is warm and dry.  Neurological:     General: No focal deficit present.     Mental Status: She is alert and oriented to person, place, and time. Mental status is at baseline.  Psychiatric:        Mood and Affect: Mood normal.        Behavior: Behavior normal.        Thought Content: Thought content normal.        Judgment: Judgment normal.      Last depression screening scores PHQ 2/9 Scores 12/15/2020 01/06/2020 11/04/2019  PHQ - 2 Score 2 1 2   PHQ- 9 Score 10 7 9    Last fall risk screening Fall Risk  12/15/2020  Falls in the past year? 0  Number falls in past yr: 0  Injury with Fall? 0  Risk for fall due to : No Fall Risks  Follow up Falls evaluation completed   Last Audit-C alcohol use screening Alcohol Use Disorder Test (AUDIT) 12/15/2020  1. How often do you have a drink containing alcohol? 0  2. How many drinks  containing alcohol do you have on a typical day when you are drinking? 0  3. How often do you have six or more drinks on one occasion? 0  AUDIT-C Score 0   A score of 3 or more in women, and 4 or more in men indicates increased risk for alcohol abuse, EXCEPT if all of the points are from question 1   No results found for any visits on 12/15/20.  Assessment & Plan    Routine Health Maintenance and Physical Exam  Exercise Activities and Dietary recommendations  Goals   None     Immunization History  Administered Date(s) Administered   Influenza,inj,Quad PF,6+ Mos 11/03/2014, 11/30/2015, 12/26/2018, 01/06/2020, 12/15/2020   Tdap 08/15/2016    Health Maintenance  Topic Date Due   COVID-19 Vaccine (1) Never done   Pneumococcal Vaccine 91-65 Years old (1 - PCV) Never done   FOOT EXAM  Never done   OPHTHALMOLOGY EXAM  Never done   URINE MICROALBUMIN  Never done   HIV Screening  Never done   Hepatitis C Screening  Never done   COLONOSCOPY (Pts 45-53yrs Insurance coverage will need to be confirmed)  Never done   Zoster Vaccines- Shingrix (1 of 2) Never done   MAMMOGRAM  07/22/2016   HEMOGLOBIN A1C  01/06/2017   COLON CANCER SCREENING ANNUAL FOBT  12/12/2020   TETANUS/TDAP  08/16/2026   INFLUENZA VACCINE  Completed   HPV VACCINES  Aged Out    Discussed health benefits of physical activity, and encouraged her to engage in regular exercise appropriate for her age and condition.  1. Annual physical exam Woman care.  [place] - CBC with Differential/Platelet - Comprehensive metabolic panel - Lipid Panel With LDL/HDL Ratio - TSH  2. Need for influenza  vaccination  - Flu Vaccine QUAD 6+ mos PF IM (Fluarix Quad PF)  3. Neuropathy  - CBC with Differential/Platelet - Comprehensive metabolic panel - Lipid Panel With LDL/HDL Ratio - TSH  4. Hypercholesteremia  - CBC with Differential/Platelet - Comprehensive metabolic panel - Lipid Panel With LDL/HDL Ratio -  TSH  5. Adult hypothyroidism  - CBC with Differential/Platelet - Comprehensive metabolic panel - Lipid Panel With LDL/HDL Ratio - TSH  6. Chronic kidney disease, unspecified CKD stage  - CBC with Differential/Platelet - Comprehensive metabolic panel - Lipid Panel With LDL/HDL Ratio - TSH  7. Gastroesophageal reflux disease without esophagitis  - CBC with Differential/Platelet - Comprehensive metabolic panel - Lipid Panel With LDL/HDL Ratio - TSH  8. Moderate episode of recurrent major depressive disorder (HCC)  BuSpar Trintellix Ritalin and Klonopin for her anxiety and depression - CBC with Differential/Platelet - Comprehensive metabolic panel - Lipid Panel With LDL/HDL Ratio - TSH  9. Chronic pain of left knee Meloxicam and follow-up with orthopedic - meloxicam (MOBIC) 7.5 MG tablet; Take 1 tablet (7.5 mg total) by mouth 2 (two) times daily as needed for pain.  Dispense: 60 tablet; Refill: 1  10. Chronic pain of right knee  - meloxicam (MOBIC) 7.5 MG tablet; Take 1 tablet (7.5 mg total) by mouth 2 (two) times daily as needed for pain.  Dispense: 60 tablet; Refill: 1   No follow-ups on file.     I, [name], MD, have reviewed all documentation for this visit. The documentation on 12/19/20 for the exam, diagnosis, procedures, and orders are all accurate and complete.    Luisana Lutzke Wendelyn Breslow, MD  [facility] [phone] (phone) [phone] (fax)  [facility]

## 2020-12-16 DIAGNOSIS — R102 Pelvic and perineal pain: Secondary | ICD-10-CM | POA: Diagnosis not present

## 2020-12-16 DIAGNOSIS — R198 Other specified symptoms and signs involving the digestive system and abdomen: Secondary | ICD-10-CM | POA: Diagnosis not present

## 2020-12-16 DIAGNOSIS — G8929 Other chronic pain: Secondary | ICD-10-CM | POA: Diagnosis not present

## 2020-12-28 ENCOUNTER — Telehealth: Payer: Self-pay

## 2020-12-28 DIAGNOSIS — Z1211 Encounter for screening for malignant neoplasm of colon: Secondary | ICD-10-CM

## 2020-12-28 NOTE — Telephone Encounter (Signed)
Patient aware that cologuard test kit was ordered 

## 2020-12-28 NOTE — Telephone Encounter (Signed)
Copied from [place] [id number]. Topic: General - Inquiry >> Dec 28, 2020 10:22 AM Loma [place] wrote: Reason for CRM: Pt states Dr. Darnell Level had told her to do the at home colonoscopy kit that she had received when mom was ill, she cannot find it. The one she had was an [employer] Well" Could you reordered and send another. She is very sorry.

## 2021-01-20 ENCOUNTER — Other Ambulatory Visit: Payer: Self-pay | Admitting: Family Medicine

## 2021-01-20 DIAGNOSIS — F329 Major depressive disorder, single episode, unspecified: Secondary | ICD-10-CM

## 2021-01-20 DIAGNOSIS — G894 Chronic pain syndrome: Secondary | ICD-10-CM

## 2021-01-20 NOTE — Telephone Encounter (Signed)
Requested medication (s) are due for refill today: No  Requested medication (s) are on the active medication list: Yes  Last refill:  Oxycodone 11/23/20 #80/0; Ritilan 12/08/20 #60/0  Future visit scheduled: yes  Notes to clinic:  Unable to refill per protocol, cannot delegate., Unable to refill per protocol due to failed labs, no updated results.    Requested Prescriptions  Pending Prescriptions Disp Refills   oxyCODONE (ROXICODONE) 15 MG immediate release tablet 80 tablet 0    Sig: Take 1 tablet (15 mg total) by mouth every 8 (eight) hours as needed for pain.     Not Delegated - Analgesics:  Opioid Agonists Failed - 01/20/2021 10:16 AM      Failed - This refill cannot be delegated      Failed - Urine Drug Screen completed in last 360 days      Passed - Valid encounter within last 6 months    Recent Outpatient Visits           1 month ago Chronic pain of right knee   [facility] Maple Hudson., MD   4 months ago Chronic pain syndrome   [facility] Maple Hudson., MD   12 months ago Cough   [facility] Maple Hudson., MD   1 year ago Need for influenza vaccination   [facility] Maple Hudson., MD   1 year ago Chronic pain syndrome   [facility] Maple Hudson., MD       Future Appointments             In 4 months Maple Hudson., MD [facility]             methylphenidate (RITALIN) 10 MG tablet 60 tablet 0    Sig: Take 1 tablet (10 mg total) by mouth 2 (two) times daily. 07/19/16     Not Delegated - Psychiatry:  Stimulants/ADHD Failed - 01/20/2021 10:16 AM      Failed - This refill cannot be delegated      Failed - Urine Drug Screen completed in last 360 days      Passed - Valid encounter within last 3 months    Recent Outpatient Visits           1 month ago Chronic pain of right knee   [facility]  Maple Hudson., MD   4 months ago Chronic pain syndrome   [facility] Maple Hudson., MD   12 months ago Cough   [facility] Maple Hudson., MD   1 year ago Need for influenza vaccination   [facility] Maple Hudson., MD   1 year ago Chronic pain syndrome   [facility] Maple Hudson., MD       Future Appointments             In 4 months Maple Hudson., MD [facility]

## 2021-01-20 NOTE — Telephone Encounter (Signed)
LOV: 12/15/2020  NOV: 06/15/2021  Thanks,   -Vernona Rieger

## 2021-01-20 NOTE — Telephone Encounter (Signed)
Medication Refill - Medication:  methylphenidate (RITALIN) 10 MG tablet  oxyCODONE (ROXICODONE) 15 MG immediate release tablet   Has the patient contacted their pharmacy? Yes.   Contact PCP  Preferred Pharmacy (with phone number or street name):  [facility] [address] [facility] - [place], [place] - [address]  [address] [place], [place] [address]  Phone:  [phone]  Fax:  [phone]   Has the patient been seen for an appointment in the last year OR does the patient have an upcoming appointment? Yes.    Agent: Please be advised that RX refills may take up to 3 business days. We ask that you follow-up with your pharmacy.

## 2021-01-21 MED ORDER — METHYLPHENIDATE HCL 10 MG PO TABS
10.0000 mg | ORAL_TABLET | Freq: Two times a day (BID) | ORAL | 0 refills | Status: DC
Start: 2021-01-21 — End: 2021-03-26

## 2021-01-21 MED ORDER — OXYCODONE HCL 15 MG PO TABS: 15.0000 mg | ORAL_TABLET | Freq: Three times a day (TID) | ORAL | 0 refills | Status: DC | PRN

## 2021-02-03 DIAGNOSIS — Z1211 Encounter for screening for malignant neoplasm of colon: Secondary | ICD-10-CM | POA: Diagnosis not present

## 2021-02-05 DIAGNOSIS — N189 Chronic kidney disease, unspecified: Secondary | ICD-10-CM | POA: Diagnosis not present

## 2021-02-05 DIAGNOSIS — Z0000 Encounter for general adult medical examination without abnormal findings: Secondary | ICD-10-CM | POA: Diagnosis not present

## 2021-02-05 DIAGNOSIS — G629 Polyneuropathy, unspecified: Secondary | ICD-10-CM | POA: Diagnosis not present

## 2021-02-05 DIAGNOSIS — E7800 Pure hypercholesterolemia, unspecified: Secondary | ICD-10-CM | POA: Diagnosis not present

## 2021-02-05 DIAGNOSIS — E039 Hypothyroidism, unspecified: Secondary | ICD-10-CM | POA: Diagnosis not present

## 2021-02-05 DIAGNOSIS — K219 Gastro-esophageal reflux disease without esophagitis: Secondary | ICD-10-CM | POA: Diagnosis not present

## 2021-02-05 DIAGNOSIS — R69 Illness, unspecified: Secondary | ICD-10-CM | POA: Diagnosis not present

## 2021-02-05 DIAGNOSIS — F331 Major depressive disorder, recurrent, moderate: Secondary | ICD-10-CM | POA: Diagnosis not present

## 2021-02-06 LAB — COMPREHENSIVE METABOLIC PANEL
ALT: 17 IU/L (ref 0–32)
AST: 19 IU/L (ref 0–40)
Albumin/Globulin Ratio: 2.1 (ref 1.2–2.2)
Albumin: 4.4 g/dL (ref 3.8–4.8)
Alkaline Phosphatase: 87 IU/L (ref 44–121)
BUN/Creatinine Ratio: 7 — ABNORMAL LOW (ref 12–28)
BUN: 8 mg/dL (ref 8–27)
Bilirubin Total: 0.6 mg/dL (ref 0.0–1.2)
CO2: 22 mmol/L (ref 20–29)
Calcium: 9.6 mg/dL (ref 8.7–10.3)
Chloride: 99 mmol/L (ref 96–106)
Creatinine, Ser: 1.13 mg/dL — ABNORMAL HIGH (ref 0.57–1.00)
Globulin, Total: 2.1 g/dL (ref 1.5–4.5)
Glucose: 106 mg/dL — ABNORMAL HIGH (ref 70–99)
Potassium: 4 mmol/L (ref 3.5–5.2)
Sodium: 137 mmol/L (ref 134–144)
Total Protein: 6.5 g/dL (ref 6.0–8.5)
eGFR: 54 mL/min/{1.73_m2} — ABNORMAL LOW (ref >59)

## 2021-02-06 LAB — CBC WITH DIFFERENTIAL/PLATELET
Basophils Absolute: 0.1 10*3/uL (ref 0.0–0.2)
Basos: 1 %
EOS (ABSOLUTE): 0.1 10*3/uL (ref 0.0–0.4)
Eos: 1 %
Hematocrit: 41.6 % (ref 34.0–46.6)
Hemoglobin: 14.6 g/dL (ref 11.1–15.9)
Immature Grans (Abs): 0 10*3/uL (ref 0.0–0.1)
Immature Granulocytes: 0 %
Lymphocytes Absolute: 2.2 10*3/uL (ref 0.7–3.1)
Lymphs: 30 %
MCH: 30.9 pg (ref 26.6–33.0)
MCHC: 35.1 g/dL (ref 31.5–35.7)
MCV: 88 fL (ref 79–97)
Monocytes Absolute: 0.5 10*3/uL (ref 0.1–0.9)
Monocytes: 6 %
Neutrophils Absolute: 4.6 10*3/uL (ref 1.4–7.0)
Neutrophils: 62 %
Platelets: 255 10*3/uL (ref 150–450)
RBC: 4.73 x10E6/uL (ref 3.77–5.28)
RDW: 12.3 % (ref 11.7–15.4)
WBC: 7.4 10*3/uL (ref 3.4–10.8)

## 2021-02-06 LAB — LIPID PANEL WITH LDL/HDL RATIO
Cholesterol, Total: 278 mg/dL — ABNORMAL HIGH (ref 100–199)
HDL: 59 mg/dL (ref >39)
LDL Chol Calc (NIH): 202 mg/dL — ABNORMAL HIGH (ref 0–99)
LDL/HDL Ratio: 3.4 ratio — ABNORMAL HIGH (ref 0.0–3.2)
Triglycerides: 97 mg/dL (ref 0–149)
VLDL Cholesterol Cal: 17 mg/dL (ref 5–40)

## 2021-02-06 LAB — TSH: TSH: 3.14 u[IU]/mL (ref 0.450–4.500)

## 2021-02-09 LAB — COLOGUARD: COLOGUARD: NEGATIVE

## 2021-02-10 ENCOUNTER — Ambulatory Visit: Payer: Self-pay | Admitting: *Deleted

## 2021-02-10 NOTE — Telephone Encounter (Signed)
Pt called in and was given her lab result message from Dr. Sherrie Mustache dated 02/09/2021 at 5:08 PM.  She does not want to start the atorvastatin now.   She is not eating well from having taken care of her mother who passed recently.   Her son is ordering her healthy meals she can microwave so she wants to try eating better before starting more medication.  She is seeing a neurologist at [facility] on [address].   Dr. Mordecai Maes.   He is needing a copy of her records.

## 2021-02-16 NOTE — Telephone Encounter (Signed)
Pt. Is requesting a diet for her cholesterol and her decreased kidney function. Asking if something could be mailed or a dietician she could talk to. Also, wants to know if she should take a baby asa daily. Please advise pt.

## 2021-02-17 NOTE — Telephone Encounter (Signed)
Pt. Given information from Dr. Sherrie Mustache. Verbalizes understanding.

## 2021-02-17 NOTE — Telephone Encounter (Signed)
Return call. Okay for pec triage to advise patient message below.

## 2021-02-17 NOTE — Telephone Encounter (Signed)
She needs to avoid saturated fat, which is mostly red meat and pork. She needs to limit saturated fat to no more than 10 gram a day. For her kidney sshe needs to limit sodium to no more than 1.5grams a day and avoid OTC NSAIDS  (e.g. no ibuprofen, naprosyn, ketoprofen, and no more than 81mg  aspirin a day) I don't think we have printouts for that's.

## 2021-03-02 ENCOUNTER — Other Ambulatory Visit: Payer: Self-pay | Admitting: Family Medicine

## 2021-03-02 NOTE — Telephone Encounter (Signed)
Requested medication (s) are due for refill today: yes  Requested medication (s) are on the active medication list: yes  Last refill:  09/21/20 #30 1 RF  Future visit scheduled: yes  Notes to clinic:  med not delegated to NT to RF   Requested Prescriptions  Pending Prescriptions Disp Refills   clonazePAM (KLONOPIN) 0.5 MG tablet [Pharmacy Med Name: CLONAZEPAM 0.5 MG TAB[!]] 30 tablet 0    Sig: TAKE ONE TABLET BY MOUTH AT BEDTIME AS NEEDED     Not Delegated - Psychiatry:  Anxiolytics/Hypnotics Failed - 03/02/2021 12:21 PM      Failed - This refill cannot be delegated      Failed - Urine Drug Screen completed in last 360 days      Passed - Valid encounter within last 6 months    Recent Outpatient Visits           2 months ago Chronic pain of right knee   [facility] Jerrol Banana., MD   6 months ago Chronic pain syndrome   [facility] Jerrol Banana., MD   1 year ago Cough   [facility] Jerrol Banana., MD   1 year ago Need for influenza vaccination   [facility] Jerrol Banana., MD   1 year ago Chronic pain syndrome   [facility] Jerrol Banana., MD       Future Appointments             In 3 months Jerrol Banana., MD [facility] [place]

## 2021-03-03 ENCOUNTER — Other Ambulatory Visit: Payer: Self-pay | Admitting: Family Medicine

## 2021-03-03 DIAGNOSIS — E039 Hypothyroidism, unspecified: Secondary | ICD-10-CM

## 2021-03-26 ENCOUNTER — Other Ambulatory Visit: Payer: Self-pay | Admitting: Family Medicine

## 2021-03-26 DIAGNOSIS — G894 Chronic pain syndrome: Secondary | ICD-10-CM

## 2021-03-26 DIAGNOSIS — F329 Major depressive disorder, single episode, unspecified: Secondary | ICD-10-CM

## 2021-03-26 NOTE — Telephone Encounter (Signed)
Requested medication (s) are due for refill today: yes  Requested medication (s) are on the active medication list: yes  Last refill:  Oxy 01/21/21 #80 with 0 RF, Ritalin 01/21/21 # 60 with 0 RF  Future visit scheduled: 06/15/21  Notes to clinic:  This medication can not be delegated, please assess.   Requested Prescriptions  Pending Prescriptions Disp Refills   oxyCODONE (ROXICODONE) 15 MG immediate release tablet 80 tablet 0    Sig: Take 1 tablet (15 mg total) by mouth every 8 (eight) hours as needed for pain.     Not Delegated - Analgesics:  Opioid Agonists Failed - 03/26/2021 12:53 PM      Failed - This refill cannot be delegated      Failed - Urine Drug Screen completed in last 360 days      Failed - Valid encounter within last 3 months    Recent Outpatient Visits           3 months ago Chronic pain of right knee   [facility] Maple Hudson., MD   7 months ago Chronic pain syndrome   [facility] Maple Hudson., MD   1 year ago Cough   [facility] Maple Hudson., MD   1 year ago Need for influenza vaccination   [facility] Maple Hudson., MD   1 year ago Chronic pain syndrome   [facility] Maple Hudson., MD       Future Appointments             In 2 months Maple Hudson., MD [facility]             methylphenidate (RITALIN) 10 MG tablet 60 tablet 0    Sig: Take 1 tablet (10 mg total) by mouth 2 (two) times daily. 07/19/16     Not Delegated - Psychiatry:  Stimulants/ADHD Failed - 03/26/2021 12:53 PM      Failed - This refill cannot be delegated      Failed - Urine Drug Screen completed in last 360 days      Passed - Last BP in normal range    BP Readings from Last 1 Encounters:  12/15/20 119/79          Passed - Last Heart Rate in normal range    Pulse Readings from Last 1 Encounters:  12/15/20 86           Passed - Valid encounter within last 6 months    Recent Outpatient Visits           3 months ago Chronic pain of right knee   [facility] Maple Hudson., MD   7 months ago Chronic pain syndrome   [facility] Maple Hudson., MD   1 year ago Cough   [facility] Maple Hudson., MD   1 year ago Need for influenza vaccination   [facility] Maple Hudson., MD   1 year ago Chronic pain syndrome   [facility] Maple Hudson., MD       Future Appointments             In 2 months Maple Hudson., MD [facility]

## 2021-03-26 NOTE — Telephone Encounter (Signed)
Medication Refill - Medication:  methylphenidate (RITALIN) 10 MG tablet  oxyCODONE (ROXICODONE) 15 MG immediate release tablet   Has the patient contacted their pharmacy? Yes.   Contact PCP  Preferred Pharmacy (with phone number or street name):  [facility] [address] [facility] - [place], [place] - [address]  [address] [place], [place] [address]  Phone:  [phone]  Fax:  [phone]   Has the patient been seen for an appointment in the last year OR does the patient have an upcoming appointment? Yes.    Agent: Please be advised that RX refills may take up to 3 business days. We ask that you follow-up with your pharmacy.

## 2021-03-29 MED ORDER — OXYCODONE HCL 15 MG PO TABS: 15.0000 mg | ORAL_TABLET | Freq: Three times a day (TID) | ORAL | 0 refills | Status: DC | PRN

## 2021-03-29 MED ORDER — METHYLPHENIDATE HCL 10 MG PO TABS: 10.0000 mg | ORAL_TABLET | Freq: Two times a day (BID) | ORAL | 0 refills | Status: DC

## 2021-04-06 DIAGNOSIS — R69 Illness, unspecified: Secondary | ICD-10-CM | POA: Diagnosis not present

## 2021-04-06 DIAGNOSIS — F411 Generalized anxiety disorder: Secondary | ICD-10-CM | POA: Diagnosis not present

## 2021-04-07 ENCOUNTER — Telehealth: Payer: Self-pay

## 2021-04-07 NOTE — Telephone Encounter (Signed)
Copied from [place] [id number]. Topic: General - Other ?>> Apr 07, 2021  9:25 AM McGill, Nelva Bush wrote: ?Reason for CRM: Pt stated she spoke with Lesly Dukes from [facility] outpatient was asked to contact PCP and have PCP fax anything related to mental health including from Dr. Bridgett Larsson. Pt is asking to have this information by April 5th. ? ?Fax- [phone] ? ?Pt is requesting a call back from [place]. ?

## 2021-04-12 NOTE — Telephone Encounter (Signed)
Please advise 

## 2021-04-12 NOTE — Telephone Encounter (Addendum)
Pt was seen at [facility] and that provider doesn't want to change meds until getting info and med reds from [facility] / please advise asap / pt would like a call about this soon  ? ?Pt also mention having a appt with [facility] for knee replacement and wanted to make sure she doesn't needs a referral / she already has the appt made / pt has [facility] financial assistance /please advise  ?

## 2021-04-20 NOTE — Telephone Encounter (Signed)
LMOVM for pt to return call. Patient needs to sign a consent form before we can sent information. Okay for pec to advise patient. Thanks. ?

## 2021-05-04 ENCOUNTER — Other Ambulatory Visit: Payer: Self-pay | Admitting: Family Medicine

## 2021-05-04 DIAGNOSIS — F329 Major depressive disorder, single episode, unspecified: Secondary | ICD-10-CM

## 2021-05-06 DIAGNOSIS — H2513 Age-related nuclear cataract, bilateral: Secondary | ICD-10-CM | POA: Diagnosis not present

## 2021-05-11 NOTE — Telephone Encounter (Signed)
Patient was unaware she had to sign a release form and will try to make it in the office tomorrow. Patient states her appointment with [facility] is tomorrow and if they receive the records they will allow her to do virtual. Patient states her nephew set up her phone to were if the person calling is not programmed in her phone the phone will ring in silent.  ?

## 2021-05-11 NOTE — Telephone Encounter (Signed)
Noted. Left form at front desk for patient to sign. ?

## 2021-05-15 ENCOUNTER — Other Ambulatory Visit: Payer: Self-pay | Admitting: Family Medicine

## 2021-05-15 DIAGNOSIS — G894 Chronic pain syndrome: Secondary | ICD-10-CM

## 2021-06-15 ENCOUNTER — Encounter: Payer: Self-pay | Admitting: Family Medicine

## 2021-06-15 ENCOUNTER — Ambulatory Visit (INDEPENDENT_AMBULATORY_CARE_PROVIDER_SITE_OTHER): Payer: Medicare HMO | Admitting: Family Medicine

## 2021-06-15 VITALS — BP 125/84 | HR 71 | Resp 16 | Wt 153.0 lb

## 2021-06-15 DIAGNOSIS — K219 Gastro-esophageal reflux disease without esophagitis: Secondary | ICD-10-CM

## 2021-06-15 DIAGNOSIS — E538 Deficiency of other specified B group vitamins: Secondary | ICD-10-CM

## 2021-06-15 DIAGNOSIS — E7800 Pure hypercholesterolemia, unspecified: Secondary | ICD-10-CM | POA: Diagnosis not present

## 2021-06-15 DIAGNOSIS — G894 Chronic pain syndrome: Secondary | ICD-10-CM

## 2021-06-15 DIAGNOSIS — F330 Major depressive disorder, recurrent, mild: Secondary | ICD-10-CM | POA: Diagnosis not present

## 2021-06-15 DIAGNOSIS — M6289 Other specified disorders of muscle: Secondary | ICD-10-CM

## 2021-06-15 DIAGNOSIS — R69 Illness, unspecified: Secondary | ICD-10-CM | POA: Diagnosis not present

## 2021-06-15 DIAGNOSIS — N189 Chronic kidney disease, unspecified: Secondary | ICD-10-CM | POA: Diagnosis not present

## 2021-06-15 DIAGNOSIS — G629 Polyneuropathy, unspecified: Secondary | ICD-10-CM | POA: Diagnosis not present

## 2021-06-15 DIAGNOSIS — R1084 Generalized abdominal pain: Secondary | ICD-10-CM | POA: Diagnosis not present

## 2021-06-15 MED ORDER — CYANOCOBALAMIN 1000 MCG/ML IJ SOLN
1000.0000 ug | Freq: Once | INTRAMUSCULAR | Status: AC
  Administered 2021-06-15: 1000 ug via INTRAMUSCULAR

## 2021-06-15 MED ORDER — ROSUVASTATIN CALCIUM 10 MG PO TABS: 10.0000 mg | ORAL_TABLET | Freq: Every day | ORAL | 1 refills | Status: DC

## 2021-06-15 MED ORDER — OMEPRAZOLE 20 MG PO CPDR: 20.0000 mg | DELAYED_RELEASE_CAPSULE | Freq: Every day | ORAL | 1 refills | Status: DC

## 2021-06-15 NOTE — Progress Notes (Signed)
?  ? ? ?Established patient visit ? ?I,[name],acting as a scribe for Wilhemena Durie, MD.,have documented all relevant documentation on the behalf of Wilhemena Durie, MD,as directed by  Wilhemena Durie, MD while in the presence of Wilhemena Durie, MD. ? ? ?Patient: Hannah Vance   DOB: 1955/04/12   66 y.o. Female  MRN: [id number] ?Visit Date: 06/15/2021 ? ?Today's healthcare provider: Wilhemena Durie, MD  ? ?Chief Complaint  ?Patient presents with  ? Follow-up  ? Hyperlipidemia  ? ?Subjective  ?  ?HPI  ?Patient comes in today for follow-up of her chronic medical problems.  She continues to have diffuse abdominal pain which she has had for years.  It seems to be worse recently. ?Complains of chronic pain and is struggling emotionally.  She is estranged from most of her family. ?She has depression that is stable.  He has chronic fatigue.  Patient requests a B12 shot today. ?Lipid/Cholesterol, Follow-up ? ?Last lipid panel Other pertinent labs  ?Lab Results  ?Component Value Date  ? CHOL 278 (H) 02/05/2021  ? HDL 59 02/05/2021  ? [place] 202 (H) 02/05/2021  ? TRIG 97 02/05/2021  ? CHOLHDL 4.3 12/28/2018  ? Lab Results  ?Component Value Date  ? ALT 17 02/05/2021  ? AST 19 02/05/2021  ? PLT 255 02/05/2021  ? TSH 3.140 02/05/2021  ?  ? ?She was last seen for this 5 months ago.  ?Management since that visit includes; labs checked showing-Cholesterol is VERY high, kidney functions are stable. Need to start atorvastatin 20mg  and schedule follow up with Dr. Rosanna Randy in 10-12 weeks. Patient declined starting atorvastatin and wants to work on diet and exercise. ? ?The 10-year ASCVD risk score (Arnett DK, et al., 2019) is: 19.2% ? ?--------------------------------------------------------------------------------------------------- ? ? ? ?Medications: ?Outpatient Medications Prior to Visit  ?Medication Sig  ? clonazePAM (KLONOPIN) 0.5 MG tablet TAKE ONE TABLET BY MOUTH AT BEDTIME AS NEEDED  ? cyclobenzaprine  (FLEXERIL) 5 MG tablet Take 1 tablet (5 mg total) by mouth 3 (three) times daily as needed for muscle spasms.  ? fluticasone (FLONASE) 50 MCG/ACT nasal spray Place 2 sprays into both nostrils daily.  ? Green Tea, Camellia sinensis, (GREEN TEA PO) Take by mouth.  ? levothyroxine (SYNTHROID) 25 MCG tablet TAKE ONE TABLET BY MOUTH ONE TIME DAILY  ? loratadine (CLARITIN) 10 MG tablet Take 1 tablet (10 mg total) by mouth daily.  ? methylphenidate (RITALIN) 10 MG tablet TAKE ONE TABLET BY MOUTH TWICE A DAY  ? oxyCODONE (ROXICODONE) 15 MG immediate release tablet TAKE ONE TABLET BY MOUTH EVERY 8 HOURS AS NEEDED FOR PAIN  ? vortioxetine HBr (TRINTELLIX) 20 MG TABS tablet Take 1 tablet (20 mg total) by mouth daily.  ? SUMAtriptan (IMITREX) 50 MG tablet Take 1 tablet (50 mg total) by mouth once for 1 dose. May repeat in 2 hours if headache persists or recurs.  ? [DISCONTINUED] albuterol (VENTOLIN HFA) 108 (90 Base) MCG/ACT inhaler Inhale 2 puffs into the lungs every 4 (four) hours as needed for wheezing or shortness of breath. (Patient not taking: Reported on 12/15/2020)  ? [DISCONTINUED] busPIRone (BUSPAR) 10 MG tablet Take 1 tablet (10 mg total) by mouth 3 (three) times daily. (Patient not taking: Reported on 12/15/2020)  ? [DISCONTINUED] meloxicam (MOBIC) 7.5 MG tablet Take 1 tablet (7.5 mg total) by mouth 2 (two) times daily as needed for pain. (Patient not taking: Reported on 06/15/2021)  ? [DISCONTINUED] tamsulosin (FLOMAX) 0.4 MG CAPS capsule  Take 1 capsule (0.4 mg total) by mouth daily. (Patient not taking: Reported on 12/15/2020)  ? ?No facility-administered medications prior to visit.  ? ? ?Review of Systems  ?Constitutional:  Negative for appetite change, chills, fatigue and fever.  ?Respiratory:  Negative for chest tightness and shortness of breath.   ?Cardiovascular:  Negative for chest pain and palpitations.  ?Gastrointestinal:  Negative for abdominal pain, nausea and vomiting.  ?Neurological:  Negative for dizziness  and weakness.  ? ?Last vitamin B12 and Folate ?Lab Results  ?Component Value Date  ? [id number] 07/06/2016  ? ?  ?  Objective  ?  ?BP 125/84 (BP Location: Left Arm, Patient Position: Sitting, Cuff Size: Normal)   Pulse 71   Resp 16   Wt 153 lb (69.4 kg)   SpO2 98%   BMI 27.98 kg/m?  ?BP Readings from Last 3 Encounters:  ?06/15/21 125/84  ?12/15/20 119/79  ?08/27/20 (!) 158/94  ? ?Wt Readings from Last 3 Encounters:  ?06/15/21 153 lb (69.4 kg)  ?12/15/20 164 lb (74.4 kg)  ?08/27/20 166 lb (75.3 kg)  ? ?  ? ?Physical Exam ?Vitals reviewed.  ?Constitutional:   ?   Appearance: Normal appearance. She is normal weight.  ?HENT:  ?   Head: Normocephalic and atraumatic.  ?   Right Ear: Tympanic membrane, ear canal and external ear normal.  ?   Left Ear: Tympanic membrane, ear canal and external ear normal.  ?   Nose: Nose normal.  ?   Mouth/Throat:  ?   Mouth: Mucous membranes are moist.  ?   Pharynx: Oropharynx is clear.  ?Eyes:  ?   Extraocular Movements: Extraocular movements intact.  ?   Conjunctiva/sclera: Conjunctivae normal.  ?   Pupils: Pupils are equal, round, and reactive to light.  ?Cardiovascular:  ?   Rate and Rhythm: Normal rate and regular rhythm.  ?   Pulses: Normal pulses.  ?   Heart sounds: Normal heart sounds.  ?Pulmonary:  ?   Effort: Pulmonary effort is normal.  ?   Breath sounds: Normal breath sounds.  ?Abdominal:  ?   General: Bowel sounds are normal.  ?   Palpations: Abdomen is soft.  ?Musculoskeletal:  ?   Cervical back: Neck supple.  ?Skin: ?   General: Skin is warm and dry.  ?Neurological:  ?   General: No focal deficit present.  ?   Mental Status: She is alert and oriented to person, place, and time. Mental status is at baseline.  ?Psychiatric:     ?   Mood and Affect: Mood normal.     ?   Behavior: Behavior normal.     ?   Thought Content: Thought content normal.     ?   Judgment: Judgment normal.  ?  ? ? ?No results found for any visits on 06/15/21. ? Assessment & Plan  ?  ? ?1. Diffuse  abdominal pain ?Patient states this is worse than her normal baseline level abdominal pain.  Pain ultrasound and start omeprazole daily.  Need referral back to her urogynecologist. ?- US Abdomen Complete ? ?2. Chronic pain syndrome ?Followed by urogynecology for pain from mainly pelvic floor.  She also has chronic back pain ? ?3. MDD (major depressive disorder), recurrent episode, mild ([place]) ?Chronic treatment. ? ?4. Hypercholesteremia ?Crestor 10 mg daily ?- rosuvastatin (CRESTOR) 10 MG tablet; Take 1 tablet (10 mg total) by mouth daily.  Dispense: 90 tablet; Refill: 1 ? ?5. Gastroesophageal reflux disease without esophagitis ? ?-  omeprazole (PRILOSEC) 20 MG capsule; Take 1 capsule (20 mg total) by mouth daily.  Dispense: 90 capsule; Refill: 1 ? ?6. B12 deficiency ? ?- cyanocobalamin ((VITAMIN B-12)) injection 1,000 mcg ? ?7. Neuropathy ? ? ?8. Chronic kidney disease, unspecified CKD stage ?Avoid nonsteroidal ? ?9. Pelvic floor dysfunction ?More than 50% 25 minute visit spent in counseling or coordination of care ? ? ? ?Return in about 5 weeks (around 07/21/2021).  ?   ? ?I, Wilhemena Durie, MD, have reviewed all documentation for this visit. The documentation on 06/20/21 for the exam, diagnosis, procedures, and orders are all accurate and complete. ? ? ? ?[facility] [place] Mon, MD  ?[facility] ?[phone] (phone) ?[phone] (fax) ? ?[facility] ?

## 2021-06-15 NOTE — Patient Instructions (Signed)

## 2021-06-23 ENCOUNTER — Ambulatory Visit
Admission: RE | Admit: 2021-06-23 | Discharge: 2021-06-23 | Disposition: A | Discharge status: Home / Self Care | Payer: Medicare HMO | Source: Ambulatory Visit | Attending: Family Medicine | Admitting: Family Medicine

## 2021-06-23 ENCOUNTER — Other Ambulatory Visit: Payer: Self-pay

## 2021-06-23 DIAGNOSIS — R1084 Generalized abdominal pain: Secondary | ICD-10-CM | POA: Diagnosis not present

## 2021-06-23 DIAGNOSIS — R109 Unspecified abdominal pain: Secondary | ICD-10-CM | POA: Diagnosis not present

## 2021-06-23 DIAGNOSIS — N281 Cyst of kidney, acquired: Secondary | ICD-10-CM | POA: Diagnosis not present

## 2021-06-24 DIAGNOSIS — Z79899 Other long term (current) drug therapy: Secondary | ICD-10-CM | POA: Diagnosis not present

## 2021-06-24 DIAGNOSIS — G43109 Migraine with aura, not intractable, without status migrainosus: Secondary | ICD-10-CM | POA: Diagnosis not present

## 2021-06-24 DIAGNOSIS — R69 Illness, unspecified: Secondary | ICD-10-CM | POA: Diagnosis not present

## 2021-06-25 ENCOUNTER — Telehealth: Payer: Self-pay

## 2021-06-25 DIAGNOSIS — K838 Other specified diseases of biliary tract: Secondary | ICD-10-CM

## 2021-06-25 NOTE — Telephone Encounter (Signed)
-----   Message from Jerrol Banana., MD sent at 06/25/2021  8:29 AM EDT ----- Mildly enlarged common bile duct.  Consider GI referral at this time..   please advise patient.

## 2021-06-25 NOTE — Telephone Encounter (Signed)
Referral placed.  Patient aware and verbalized understanding.  

## 2021-07-06 ENCOUNTER — Other Ambulatory Visit: Payer: Self-pay | Admitting: Family Medicine

## 2021-07-06 DIAGNOSIS — G894 Chronic pain syndrome: Secondary | ICD-10-CM

## 2021-07-06 DIAGNOSIS — F329 Major depressive disorder, single episode, unspecified: Secondary | ICD-10-CM

## 2021-07-20 ENCOUNTER — Encounter: Payer: Self-pay | Admitting: Family Medicine

## 2021-07-20 NOTE — Progress Notes (Signed)
Established patient visit  I,Hannah Vance,acting as a scribe for Wilhemena Durie, MD.,have documented all relevant documentation on the behalf of Wilhemena Durie, MD,as directed by  Wilhemena Durie, MD while in the presence of Wilhemena Durie, MD.   Patient: Hannah Vance   DOB: November 30, 1955   66 y.o. Female  MRN: [id number] Visit Date: 07/21/2021  Today's healthcare provider: Wilhemena Durie, MD   Chief Complaint  Patient presents with   Follow-up   Abdominal Pain   Subjective    HPI  Patient says her abdominal symptoms are much better since starting omeprazole.  She feels less pain and less bloating.  Follow up for abdominal pain  The patient was last seen for this 4-5 weeks ago. Changes made at last visit include ultrasound ordered and start omeprazole daily.  Referral placed to urogynecologist.  She reports good compliance with treatment. She feels that condition is Improved. She is not having side effects. not  -----------------------------------------------------------------------------------------  Patient was also started on atorvastatin 20mg  once every day on 01/26/2021 patient is due to have labs repeat.  Medications: Outpatient Medications Prior to Visit  Medication Sig   clonazePAM (KLONOPIN) 0.5 MG tablet TAKE ONE TABLET BY MOUTH AT BEDTIME AS NEEDED   cyclobenzaprine (FLEXERIL) 5 MG tablet Take 1 tablet (5 mg total) by mouth 3 (three) times daily as needed for muscle spasms.   fluticasone (FLONASE) 50 MCG/ACT nasal spray Place 2 sprays into both nostrils daily.   Green Tea, Camellia sinensis, (GREEN TEA PO) Take by mouth.   levothyroxine (SYNTHROID) 25 MCG tablet TAKE ONE TABLET BY MOUTH ONE TIME DAILY   loratadine (CLARITIN) 10 MG tablet Take 1 tablet (10 mg total) by mouth daily.   methylphenidate (RITALIN) 10 MG tablet TAKE ONE TABLET BY MOUTH TWICE A DAY   omeprazole (PRILOSEC) 20 MG capsule Take 1 capsule (20 mg total) by mouth daily.    oxyCODONE (ROXICODONE) 15 MG immediate release tablet TAKE ONE TABLET BY MOUTH EVERY 8 HOURS AS NEEDED FOR PAIN   rosuvastatin (CRESTOR) 10 MG tablet Take 1 tablet (10 mg total) by mouth daily.   SUMAtriptan (IMITREX) 100 MG tablet Take 1 tablet at onset of migraine. May repeat in 2 hrs. Max 2/day and 2-3 days/week.   vortioxetine HBr (TRINTELLIX) 20 MG TABS tablet Take 1 tablet (20 mg total) by mouth daily.   [DISCONTINUED] SUMAtriptan (IMITREX) 50 MG tablet Take 1 tablet (50 mg total) by mouth once for 1 dose. May repeat in 2 hours if headache persists or recurs.   No facility-administered medications prior to visit.    Review of Systems  Last lipids Lab Results  Component Value Date   CHOL 120 07/22/2021   HDL 52 07/22/2021   LDLCALC 53 07/22/2021   TRIG 75 07/22/2021   CHOLHDL 2.3 07/22/2021       Objective    BP (!) 145/82 (BP Location: Right Arm, Patient Position: Sitting, Cuff Size: Normal)   Pulse 79   Resp 16   Wt 151 lb (68.5 kg)   SpO2 98%   BMI 27.62 kg/m  BP Readings from Last 3 Encounters:  07/21/21 (!) 145/82  06/15/21 125/84  12/15/20 119/79   Wt Readings from Last 3 Encounters:  07/21/21 151 lb (68.5 kg)  06/15/21 153 lb (69.4 kg)  12/15/20 164 lb (74.4 kg)      Physical Exam Vitals reviewed.  Constitutional:      Appearance: Normal appearance. She  is normal weight.  HENT:     Head: Normocephalic and atraumatic.     Right Ear: Tympanic membrane, ear canal and external ear normal.     Left Ear: Tympanic membrane, ear canal and external ear normal.     Nose: Nose normal.     Mouth/Throat:     Mouth: Mucous membranes are moist.     Pharynx: Oropharynx is clear.  Eyes:     Extraocular Movements: Extraocular movements intact.     Conjunctiva/sclera: Conjunctivae normal.     Pupils: Pupils are equal, round, and reactive to light.  Cardiovascular:     Rate and Rhythm: Normal rate and regular rhythm.     Pulses: Normal pulses.     Heart sounds:  Normal heart sounds.  Pulmonary:     Effort: Pulmonary effort is normal.     Breath sounds: Normal breath sounds.  Abdominal:     General: Bowel sounds are normal.     Palpations: Abdomen is soft.  Musculoskeletal:     Cervical back: Neck supple.  Skin:    General: Skin is warm and dry.  Neurological:     General: No focal deficit present.     Mental Status: She is alert and oriented to person, place, and time. Mental status is at baseline.  Psychiatric:        Mood and Affect: Mood normal.        Behavior: Behavior normal.        Thought Content: Thought content normal.        Judgment: Judgment normal.       No results found for any visits on 07/21/21.  Assessment & Plan    Return in about 6 months (around 01/20/2022).      1. Diffuse abdominal pain Improved on Prilosec.  2. Hypercholesteremia Now on statin.  Follow-up lipids - Lipid panel - Home sleep test  3. B12 deficiency Patient request a B12 shot. - cyanocobalamin ((VITAMIN B-12)) injection 1,000 mcg  4. Leukocytes in urine  - POCT urinalysis dipstick--Abnormal - CULTURE, URINE COMPREHENSIVE  5. Common bile duct dilatation Has GI appointment for follow-up.  6. Chronic pain syndrome Followed at [facility]  7. Gastroesophageal reflux disease without esophagitis   8. Chronic kidney disease, unspecified CKD stage Avoid nonsteroidals  9. Moderate episode of recurrent major depressive disorder (HCC) On Trintellix and methylphenidate for depression that was started by psychiatry years ago.  10. Pelvic floor dysfunction Followed at [place], [place]  [facility] [phone] (phone) [phone] (fax)  [place]

## 2021-07-21 ENCOUNTER — Ambulatory Visit (INDEPENDENT_AMBULATORY_CARE_PROVIDER_SITE_OTHER): Payer: Medicare HMO | Admitting: Family Medicine

## 2021-07-21 ENCOUNTER — Encounter: Payer: Self-pay | Admitting: Family Medicine

## 2021-07-21 VITALS — BP 145/82 | HR 79 | Resp 16 | Wt 151.0 lb

## 2021-07-21 DIAGNOSIS — R1084 Generalized abdominal pain: Secondary | ICD-10-CM | POA: Diagnosis not present

## 2021-07-21 DIAGNOSIS — E7800 Pure hypercholesterolemia, unspecified: Secondary | ICD-10-CM | POA: Diagnosis not present

## 2021-07-21 DIAGNOSIS — R82998 Other abnormal findings in urine: Secondary | ICD-10-CM

## 2021-07-21 DIAGNOSIS — F331 Major depressive disorder, recurrent, moderate: Secondary | ICD-10-CM

## 2021-07-21 DIAGNOSIS — E538 Deficiency of other specified B group vitamins: Secondary | ICD-10-CM

## 2021-07-21 DIAGNOSIS — N189 Chronic kidney disease, unspecified: Secondary | ICD-10-CM | POA: Diagnosis not present

## 2021-07-21 DIAGNOSIS — K838 Other specified diseases of biliary tract: Secondary | ICD-10-CM

## 2021-07-21 DIAGNOSIS — M6289 Other specified disorders of muscle: Secondary | ICD-10-CM | POA: Diagnosis not present

## 2021-07-21 DIAGNOSIS — K219 Gastro-esophageal reflux disease without esophagitis: Secondary | ICD-10-CM

## 2021-07-21 DIAGNOSIS — R69 Illness, unspecified: Secondary | ICD-10-CM | POA: Diagnosis not present

## 2021-07-21 DIAGNOSIS — G894 Chronic pain syndrome: Secondary | ICD-10-CM

## 2021-07-21 MED ORDER — CYANOCOBALAMIN 1000 MCG/ML IJ SOLN
1000.0000 ug | Freq: Once | INTRAMUSCULAR | Status: AC
  Administered 2021-07-21: 1000 ug via INTRAMUSCULAR

## 2021-07-22 DIAGNOSIS — E7800 Pure hypercholesterolemia, unspecified: Secondary | ICD-10-CM | POA: Diagnosis not present

## 2021-07-23 LAB — LIPID PANEL
Chol/HDL Ratio: 2.3 ratio (ref 0.0–4.4)
Cholesterol, Total: 120 mg/dL (ref 100–199)
HDL: 52 mg/dL (ref >39)
LDL Chol Calc (NIH): 53 mg/dL (ref 0–99)
Triglycerides: 75 mg/dL (ref 0–149)
VLDL Cholesterol Cal: 15 mg/dL (ref 5–40)

## 2021-07-24 LAB — CULTURE, URINE COMPREHENSIVE

## 2021-07-27 LAB — POCT URINALYSIS DIPSTICK
Bilirubin, UA: NEGATIVE
Blood, UA: POSITIVE
Glucose, UA: NEGATIVE
Ketones, UA: NEGATIVE
Nitrite, UA: NEGATIVE
Protein, UA: NEGATIVE
Spec Grav, UA: 1.01 (ref 1.010–1.025)
Urobilinogen, UA: 0.2 E.U./dL
pH, UA: 6 (ref 5.0–8.0)

## 2021-08-20 DIAGNOSIS — G8929 Other chronic pain: Secondary | ICD-10-CM | POA: Diagnosis not present

## 2021-08-20 DIAGNOSIS — M62838 Other muscle spasm: Secondary | ICD-10-CM | POA: Diagnosis not present

## 2021-08-20 DIAGNOSIS — R102 Pelvic and perineal pain: Secondary | ICD-10-CM | POA: Diagnosis not present

## 2021-08-20 DIAGNOSIS — N952 Postmenopausal atrophic vaginitis: Secondary | ICD-10-CM | POA: Diagnosis not present

## 2021-08-20 DIAGNOSIS — R1032 Left lower quadrant pain: Secondary | ICD-10-CM | POA: Diagnosis not present

## 2021-08-24 ENCOUNTER — Other Ambulatory Visit: Payer: Self-pay | Admitting: Family Medicine

## 2021-08-24 DIAGNOSIS — G894 Chronic pain syndrome: Secondary | ICD-10-CM

## 2021-08-24 DIAGNOSIS — F329 Major depressive disorder, single episode, unspecified: Secondary | ICD-10-CM

## 2021-08-25 DIAGNOSIS — M1712 Unilateral primary osteoarthritis, left knee: Secondary | ICD-10-CM | POA: Diagnosis not present

## 2021-08-25 DIAGNOSIS — R69 Illness, unspecified: Secondary | ICD-10-CM | POA: Diagnosis not present

## 2021-09-01 ENCOUNTER — Telehealth: Payer: Self-pay | Admitting: Family Medicine

## 2021-09-01 ENCOUNTER — Ambulatory Visit: Payer: Self-pay | Admitting: *Deleted

## 2021-09-01 NOTE — Telephone Encounter (Signed)
  Chief Complaint: nausea back requesting appt Symptoms: nausea, dizziness, chills, weakness, dry mouth abdominal swelling , bloating  Frequency: 3 weeks  Pertinent Negatives: Patient denies chest pain difficulty breathing  Disposition: [x] ED /[] Urgent Care (no appt availability in office) / [] Appointment(In office/virtual)/ []  [facility]/ [] Home Care/ [x] Refused Recommended Disposition /[]  Mobile Bus/ []  Follow-up with PCP Additional Notes:   Requesting to see PCP . Requesting referral for GI can go to [place] due to [facility] is there that she can see prior to October as previous GI can not see her until October . Was better feeling worse now. No transportation . Recommended ED would like to wait .    Reason for Disposition  [1] Drinking very little AND [2] dehydration suspected (e.g., no urine > 12 hours, very dry mouth, very lightheaded)  Answer Assessment - Initial Assessment Questions 1. NAUSEA SEVERITY: "How bad is the nausea?" (e.g., mild, moderate, severe; dehydration, weight loss)   - MILD: loss of appetite without change in eating habits   - MODERATE: decreased oral intake without significant weight loss, dehydration, or malnutrition   - SEVERE: inadequate caloric or fluid intake, significant weight loss, symptoms of dehydration     Moderate  2. ONSET: "When did the nausea begin?"     3 weeks ago  3. VOMITING: "Any vomiting?" If Yes, ask: "How many times today?"     no 4. RECURRENT SYMPTOM: "Have you had nausea before?" If Yes, ask: "When was the last time?" "What happened that time?"     Yes seen by PCP 5. CAUSE: "What do you think is causing the nausea?"     Not sure 6. PREGNANCY: "Is there any chance you are pregnant?" (e.g., unprotected intercourse, missed birth control pill, broken condom)     na  Protocols used: Nausea-A-AH

## 2021-09-01 NOTE — Telephone Encounter (Signed)
Pt called in for assistance. Pt says that she was referred to a GI specialist but is unable to be seen until October. Pt would like to know if possible could provider refer her to [facility] instead? Pt says that she sees that their providers came from [facility] and feels that they could help.    Pt would like further assistance.

## 2021-09-02 ENCOUNTER — Ambulatory Visit: Payer: Medicare HMO | Admitting: Family Medicine

## 2021-09-02 ENCOUNTER — Other Ambulatory Visit: Payer: Self-pay | Admitting: Family Medicine

## 2021-09-02 DIAGNOSIS — E039 Hypothyroidism, unspecified: Secondary | ICD-10-CM

## 2021-09-02 NOTE — Telephone Encounter (Signed)
Referral sent to [facility] GI department 09/02/21

## 2021-09-02 NOTE — Telephone Encounter (Signed)
Thanks. Noted.

## 2021-09-02 NOTE — Telephone Encounter (Signed)
Left message on VM for her to come in 09/02/21 at 3:40 with [facility].

## 2021-09-02 NOTE — Telephone Encounter (Signed)
LM letting patient know.  °

## 2021-09-03 NOTE — Telephone Encounter (Signed)
Requested medication (s) are due for refill today:   Provider to review  Requested medication (s) are on the active medication list:   Yes for both  Future visit scheduled:   Yes   Last ordered: Klonopin 07/07/2021 #30, 0 refill - non delegated refill;   Synthroid 03/03/2021 #90, 1 refill  Returned due to non delegated refill   Requested Prescriptions  Pending Prescriptions Disp Refills   clonazePAM (KLONOPIN) 0.5 MG tablet [Pharmacy Med Name: CLONAZEPAM 0.5 MG TAB[!]] 30 tablet 0    Sig: TAKE ONE TABLET BY MOUTH AT BEDTIME AS NEEDED     Not Delegated - Psychiatry: Anxiolytics/Hypnotics 2 Failed - 09/02/2021  3:21 PM      Failed - This refill cannot be delegated      Failed - Urine Drug Screen completed in last 360 days      Passed - Patient is not pregnant      Passed - Valid encounter within last 6 months    Recent Outpatient Visits           1 month ago Diffuse abdominal pain   [facility] Maple Hudson., MD   2 months ago Diffuse abdominal pain   [facility] Maple Hudson., MD   8 months ago Chronic pain of right knee   [facility] Maple Hudson., MD   1 year ago Chronic pain syndrome   [facility] Maple Hudson., MD   1 year ago Cough   [facility] Maple Hudson., MD       Future Appointments             In 2 weeks Maple Hudson., MD [facility]   In 4 months Maple Hudson., MD [facility]             levothyroxine (SYNTHROID) 25 MCG tablet [Pharmacy Med Name: LEVOTHYROXINE 25 MCG TAB[*]] 90 tablet 0    Sig: TAKE ONE TABLET BY MOUTH ONE TIME DAILY     Endocrinology:  Hypothyroid Agents Passed - 09/02/2021  3:21 PM      Passed - TSH in normal range and within 360 days    TSH  Date Value Ref Range Status  02/05/2021 3.140 0.450 - 4.500 uIU/mL Final         Passed - Valid encounter within  last 12 months    Recent Outpatient Visits           1 month ago Diffuse abdominal pain   [facility] Maple Hudson., MD   2 months ago Diffuse abdominal pain   [facility] Maple Hudson., MD   8 months ago Chronic pain of right knee   [facility] Maple Hudson., MD   1 year ago Chronic pain syndrome   [facility] Maple Hudson., MD   1 year ago Cough   [facility] Maple Hudson., MD       Future Appointments             In 2 weeks Maple Hudson., MD [facility]   In 4 months Maple Hudson., MD [facility]

## 2021-09-03 NOTE — Telephone Encounter (Signed)
Patient states she stopped the magnesium- due to kidney disease- patient has restarted the magnesium ( GYN advised restart) and thinks that may be part of pain? Patient advised can be side effect of magnesium. Patient also wants to know if there is an oral medication she can use it the place of power Miralax. Patient states she used laxative and it did help to get bowels to move and pain did ease some. Patient states she will call EMS if needed.  Can patient be rescheduled- she missed appointment due to sleeping late and not getting message. Patient has been scheduled with provider 09/22/21.

## 2021-09-22 ENCOUNTER — Ambulatory Visit: Payer: Medicare HMO | Admitting: Family Medicine

## 2021-09-22 NOTE — Progress Notes (Deleted)
      Established patient visit   Patient: Hannah Vance   DOB: 01-18-1956   66 y.o. Female  MRN: [id number] Visit Date: 09/22/2021  Today's healthcare provider: [name], MD   No chief complaint on file.  Subjective    HPI  Patient is here for follow up on chronic problems.  Medications: Outpatient Medications Prior to Visit  Medication Sig   clonazePAM (KLONOPIN) 0.5 MG tablet TAKE ONE TABLET BY MOUTH AT BEDTIME AS NEEDED   cyclobenzaprine (FLEXERIL) 5 MG tablet Take 1 tablet (5 mg total) by mouth 3 (three) times daily as needed for muscle spasms.   fluticasone (FLONASE) 50 MCG/ACT nasal spray Place 2 sprays into both nostrils daily.   Green Tea, Camellia sinensis, (GREEN TEA PO) Take by mouth.   levothyroxine (SYNTHROID) 25 MCG tablet TAKE ONE TABLET BY MOUTH ONE TIME DAILY   loratadine (CLARITIN) 10 MG tablet Take 1 tablet (10 mg total) by mouth daily.   methylphenidate (RITALIN) 10 MG tablet TAKE ONE TABLET BY MOUTH TWICE A DAY   omeprazole (PRILOSEC) 20 MG capsule Take 1 capsule (20 mg total) by mouth daily.   oxyCODONE (ROXICODONE) 15 MG immediate release tablet TAKE ONE TABLET BY MOUTH EVERY 8 HOURS AS NEEDED FOR PAIN   rosuvastatin (CRESTOR) 10 MG tablet Take 1 tablet (10 mg total) by mouth daily.   SUMAtriptan (IMITREX) 100 MG tablet Take 1 tablet at onset of migraine. May repeat in 2 hrs. Max 2/day and 2-3 days/week.   vortioxetine HBr (TRINTELLIX) 20 MG TABS tablet Take 1 tablet (20 mg total) by mouth daily.   No facility-administered medications prior to visit.    Review of Systems  Constitutional:  Negative for appetite change, chills, fatigue and fever.  Respiratory:  Negative for chest tightness and shortness of breath.   Cardiovascular:  Negative for chest pain and palpitations.  Gastrointestinal:  Negative for abdominal pain, nausea and vomiting.  Neurological:  Negative for dizziness and weakness.    {Labs  Heme  Chem  Endocrine  Serology   Results Review (optional):23779}   Objective    There were no vitals taken for this visit. {Show previous vital signs (optional):23777}  Physical Exam  ***  No results found for any visits on 09/22/21.  Assessment & Plan     ***  No follow-ups on file.      {provider attestation***:1}   [name], MD  [facility] [phone] (phone) [phone] (fax)  [facility]

## 2021-09-23 ENCOUNTER — Ambulatory Visit: Payer: Medicare HMO | Admitting: Family Medicine

## 2021-09-23 NOTE — Progress Notes (Deleted)
      Established patient visit   Patient: Hannah Vance   DOB: Oct 20, 1955   66 y.o. Female  MRN: [id number] Visit Date: 09/23/2021  Today's healthcare provider: [name], MD   No chief complaint on file.  Subjective    HPI  Depression, Follow-up  She  was last seen for this 2 months ago. Changes made at last visit include; On Trintellix and methylphenidate for depression that was started by psychiatry years ago..      12/15/2020   11:27 AM 01/06/2020    4:18 PM 11/04/2019    4:00 PM  Depression screen PHQ 2/9  Decreased Interest 1 0 1  Down, Depressed, Hopeless 1 1 1   PHQ - 2 Score 2 1 2   Altered sleeping 2 1 2   Tired, decreased energy 1 1 3   Change in appetite 2 1 1   Feeling bad or failure about yourself  1 2 0  Trouble concentrating 2 1 1   Moving slowly or fidgety/restless 0 0 0  Suicidal thoughts 0 0 0  PHQ-9 Score 10 7 9   Difficult doing work/chores Somewhat difficult Not difficult at all Somewhat difficult    -----------------------------------------------------------------------------------------   Medications: Outpatient Medications Prior to Visit  Medication Sig   clonazePAM (KLONOPIN) 0.5 MG tablet TAKE ONE TABLET BY MOUTH AT BEDTIME AS NEEDED   cyclobenzaprine (FLEXERIL) 5 MG tablet Take 1 tablet (5 mg total) by mouth 3 (three) times daily as needed for muscle spasms.   fluticasone (FLONASE) 50 MCG/ACT nasal spray Place 2 sprays into both nostrils daily.   Green Tea, Camellia sinensis, (GREEN TEA PO) Take by mouth.   levothyroxine (SYNTHROID) 25 MCG tablet TAKE ONE TABLET BY MOUTH ONE TIME DAILY   loratadine (CLARITIN) 10 MG tablet Take 1 tablet (10 mg total) by mouth daily.   methylphenidate (RITALIN) 10 MG tablet TAKE ONE TABLET BY MOUTH TWICE A DAY   omeprazole (PRILOSEC) 20 MG capsule Take 1 capsule (20 mg total) by mouth daily.   oxyCODONE (ROXICODONE) 15 MG immediate release tablet TAKE ONE TABLET BY MOUTH EVERY 8 HOURS AS NEEDED FOR  PAIN   rosuvastatin (CRESTOR) 10 MG tablet Take 1 tablet (10 mg total) by mouth daily.   SUMAtriptan (IMITREX) 100 MG tablet Take 1 tablet at onset of migraine. May repeat in 2 hrs. Max 2/day and 2-3 days/week.   vortioxetine HBr (TRINTELLIX) 20 MG TABS tablet Take 1 tablet (20 mg total) by mouth daily.   No facility-administered medications prior to visit.    Review of Systems  Constitutional:  Negative for appetite change, chills, fatigue and fever.  Respiratory:  Negative for chest tightness and shortness of breath.   Cardiovascular:  Negative for chest pain and palpitations.  Gastrointestinal:  Negative for abdominal pain, nausea and vomiting.  Neurological:  Negative for dizziness and weakness.    {Labs  Heme  Chem  Endocrine  Serology  Results Review (optional):23779}   Objective    There were no vitals taken for this visit. {Show previous vital signs (optional):23777}  Physical Exam  ***  No results found for any visits on 09/23/21.  Assessment & Plan     ***  No follow-ups on file.      {provider attestation***:1}   , MD  [facility] [phone] (phone) [phone] (fax)  [facility]

## 2021-09-28 NOTE — Progress Notes (Deleted)
      Established patient visit   Patient: Hannah Vance   DOB: [birth year]   66 y.o. Female  MRN: [id number] Visit Date: 09/29/2021  Today's healthcare provider: [name], MD   No chief complaint on file.  Subjective    HPI  Patient is here to follow up on chronic problems.  Medications: Outpatient Medications Prior to Visit  Medication Sig   clonazePAM (KLONOPIN) 0.5 MG tablet TAKE ONE TABLET BY MOUTH AT BEDTIME AS NEEDED   cyclobenzaprine (FLEXERIL) 5 MG tablet Take 1 tablet (5 mg total) by mouth 3 (three) times daily as needed for muscle spasms.   fluticasone (FLONASE) 50 MCG/ACT nasal spray Place 2 sprays into both nostrils daily.   Green Tea, Camellia sinensis, (GREEN TEA PO) Take by mouth.   levothyroxine (SYNTHROID) 25 MCG tablet TAKE ONE TABLET BY MOUTH ONE TIME DAILY   loratadine (CLARITIN) 10 MG tablet Take 1 tablet (10 mg total) by mouth daily.   methylphenidate (RITALIN) 10 MG tablet TAKE ONE TABLET BY MOUTH TWICE A DAY   omeprazole (PRILOSEC) 20 MG capsule Take 1 capsule (20 mg total) by mouth daily.   oxyCODONE (ROXICODONE) 15 MG immediate release tablet TAKE ONE TABLET BY MOUTH EVERY 8 HOURS AS NEEDED FOR PAIN   rosuvastatin (CRESTOR) 10 MG tablet Take 1 tablet (10 mg total) by mouth daily.   SUMAtriptan (IMITREX) 100 MG tablet Take 1 tablet at onset of migraine. May repeat in 2 hrs. Max 2/day and 2-3 days/week.   vortioxetine HBr (TRINTELLIX) 20 MG TABS tablet Take 1 tablet (20 mg total) by mouth daily.   No facility-administered medications prior to visit.    Review of Systems  Constitutional:  Negative for appetite change, chills, fatigue and fever.  Respiratory:  Negative for chest tightness and shortness of breath.   Cardiovascular:  Negative for chest pain and palpitations.  Gastrointestinal:  Negative for abdominal pain, nausea and vomiting.  Neurological:  Negative for dizziness and weakness.   {Labs  Heme  Chem  Endocrine  Serology   Results Review (optional):23779}   Objective    There were no vitals taken for this visit. {Show previous vital signs (optional):23777}  Physical Exam  ***  No results found for any visits on 09/29/21.  Assessment & Plan     ***  No follow-ups on file.      {provider attestation***:1}   [name], MD  [facility] [phone] (phone) [phone] (fax)  [facility]

## 2021-09-29 ENCOUNTER — Ambulatory Visit: Payer: Medicare HMO | Admitting: Family Medicine

## 2021-10-05 ENCOUNTER — Ambulatory Visit: Payer: Medicare HMO | Admitting: Family Medicine

## 2021-10-12 ENCOUNTER — Other Ambulatory Visit: Payer: Self-pay | Admitting: Family Medicine

## 2021-10-12 DIAGNOSIS — F329 Major depressive disorder, single episode, unspecified: Secondary | ICD-10-CM

## 2021-10-12 DIAGNOSIS — G894 Chronic pain syndrome: Secondary | ICD-10-CM

## 2021-10-14 ENCOUNTER — Telehealth: Payer: Self-pay

## 2021-10-14 NOTE — Telephone Encounter (Signed)
Copied from CRM [id number]. Topic: Appointment Scheduling - Scheduling Inquiry for Clinic >> Oct 14, 2021  9:09 AM Hannah Vance wrote: Pt requesting an appt w/ Dr. Sullivan Lone only  Advised pt provider does not have any availability  Pt requesting to be fit in provider's schedule and inquiring if provider has any recommendations on another MD that will manage her illness  Please fu w/ pt

## 2021-11-12 DIAGNOSIS — R1084 Generalized abdominal pain: Secondary | ICD-10-CM | POA: Diagnosis not present

## 2021-11-12 DIAGNOSIS — K5903 Drug induced constipation: Secondary | ICD-10-CM | POA: Diagnosis not present

## 2021-11-12 DIAGNOSIS — Z1211 Encounter for screening for malignant neoplasm of colon: Secondary | ICD-10-CM | POA: Diagnosis not present

## 2021-11-12 DIAGNOSIS — K219 Gastro-esophageal reflux disease without esophagitis: Secondary | ICD-10-CM | POA: Diagnosis not present

## 2021-11-24 ENCOUNTER — Ambulatory Visit: Payer: Medicare HMO | Admitting: Family Medicine

## 2021-11-29 ENCOUNTER — Other Ambulatory Visit: Payer: Self-pay | Admitting: Family Medicine

## 2021-11-29 DIAGNOSIS — G894 Chronic pain syndrome: Secondary | ICD-10-CM

## 2021-11-29 DIAGNOSIS — F329 Major depressive disorder, single episode, unspecified: Secondary | ICD-10-CM

## 2021-12-03 ENCOUNTER — Other Ambulatory Visit: Payer: Self-pay | Admitting: Family Medicine

## 2021-12-03 DIAGNOSIS — E039 Hypothyroidism, unspecified: Secondary | ICD-10-CM

## 2021-12-03 DIAGNOSIS — E7800 Pure hypercholesterolemia, unspecified: Secondary | ICD-10-CM

## 2021-12-03 DIAGNOSIS — K219 Gastro-esophageal reflux disease without esophagitis: Secondary | ICD-10-CM

## 2021-12-03 DIAGNOSIS — F330 Major depressive disorder, recurrent, mild: Secondary | ICD-10-CM

## 2021-12-03 MED ORDER — LEVOTHYROXINE SODIUM 25 MCG PO TABS: 25.0000 ug | ORAL_TABLET | Freq: Every day | ORAL | 0 refills | Status: DC

## 2021-12-03 MED ORDER — OMEPRAZOLE 20 MG PO CPDR: 20.0000 mg | DELAYED_RELEASE_CAPSULE | Freq: Every day | ORAL | 0 refills | Status: DC

## 2021-12-03 MED ORDER — ROSUVASTATIN CALCIUM 10 MG PO TABS: 10.0000 mg | ORAL_TABLET | Freq: Every day | ORAL | 0 refills | Status: DC

## 2021-12-03 NOTE — Telephone Encounter (Signed)
Medication Refill - Medication: rosuvastatin (CRESTOR) 10 MG tablet omeprazole (PRILOSEC) 20 MG capsule levothyroxine (SYNTHROID) 25 MCG tablet clonazePAM (KLONOPIN) 0.5 MG tablet Has the patient contacted their pharmacy? No. She wasn't sure who to call first.  I said I would help.  Preferred Pharmacy (with phone number or street name): [facility] [address] [facility] - [place], [place] - [address] AT [address]  Has the patient been seen for an appointment in the last year OR does the patient have an upcoming appointment? Yes.    Agent: Please be advised that RX refills may take up to 3 business days. We ask that you follow-up with your pharmacy.

## 2021-12-03 NOTE — Telephone Encounter (Signed)
Requested medication (s) are due for refill today: Yes  Requested medication (s) are on the active medication list:Yes   Last refill:  09/08/21  Future visit scheduled: Yes  Notes to clinic:  Unable to refill per protocol, cannot delegate.      Requested Prescriptions  Pending Prescriptions Disp Refills   clonazePAM (KLONOPIN) 0.5 MG tablet 30 tablet 2    Sig: Take 1 tablet (0.5 mg total) by mouth at bedtime as needed.     Not Delegated - Psychiatry: Anxiolytics/Hypnotics 2 Failed - 12/03/2021  4:14 PM      Failed - This refill cannot be delegated      Failed - Urine Drug Screen completed in last 360 days      Passed - Patient is not pregnant      Passed - Valid encounter within last 6 months    Recent Outpatient Visits           4 months ago Diffuse abdominal pain   [facility] Maple Hudson., MD   5 months ago Diffuse abdominal pain   [facility] Maple Hudson., MD   11 months ago Chronic pain of right knee   [facility] Maple Hudson., MD   1 year ago Chronic pain syndrome   [facility] Maple Hudson., MD   1 year ago Cough   [facility] Maple Hudson., MD       Future Appointments             In 1 month Simmons-Robinson, Tawanna Cooler, MD [facility]            Signed Prescriptions Disp Refills   rosuvastatin (CRESTOR) 10 MG tablet 90 tablet 0    Sig: Take 1 tablet (10 mg total) by mouth daily.     Cardiovascular:  Antilipid - Statins 2 Failed - 12/03/2021  4:14 PM      Failed - Cr in normal range and within 360 days    Creatinine  Date Value Ref Range Status  10/31/2013 1.39 (H) 0.60 - 1.30 mg/dL Final   Creatinine, Ser  Date Value Ref Range Status  02/05/2021 1.13 (H) 0.57 - 1.00 mg/dL Final         Failed - Lipid Panel in normal range within the last 12 months    Cholesterol, Total  Date Value Ref Range Status   07/22/2021 120 100 - 199 mg/dL Final   LDL Chol Calc (NIH)  Date Value Ref Range Status  07/22/2021 53 0 - 99 mg/dL Final   HDL  Date Value Ref Range Status  07/22/2021 52 >39 mg/dL Final   Triglycerides  Date Value Ref Range Status  07/22/2021 75 0 - 149 mg/dL Final         Passed - Patient is not pregnant      Passed - Valid encounter within last 12 months    Recent Outpatient Visits           4 months ago Diffuse abdominal pain   [facility] Maple Hudson., MD   5 months ago Diffuse abdominal pain   [facility] Maple Hudson., MD   11 months ago Chronic pain of right knee   [facility] Maple Hudson., MD   1 year ago Chronic pain syndrome   [facility] Maple Hudson., MD   1 year ago  Cough   [facility] Jerrol Banana., MD       Future Appointments             In 1 month Simmons-Robinson, Riki Sheer, MD [facility]             omeprazole (PRILOSEC) 20 MG capsule 90 capsule 0    Sig: Take 1 capsule (20 mg total) by mouth daily.     Gastroenterology: Proton Pump Inhibitors Passed - 12/03/2021  4:14 PM      Passed - Valid encounter within last 12 months    Recent Outpatient Visits           4 months ago Diffuse abdominal pain   [facility] Jerrol Banana., MD   5 months ago Diffuse abdominal pain   [facility] Jerrol Banana., MD   11 months ago Chronic pain of right knee   [facility] Jerrol Banana., MD   1 year ago Chronic pain syndrome   [facility] Jerrol [facility]., MD   1 year ago Cough   [facility] Jerrol Banana., MD       Future Appointments             In 1 month Simmons-Robinson, Riki Sheer, MD [facility]             levothyroxine (SYNTHROID) 25 MCG tablet 90 tablet 0     Sig: Take 1 tablet (25 mcg total) by mouth daily.     Endocrinology:  Hypothyroid Agents Passed - 12/03/2021  4:14 PM      Passed - TSH in normal range and within 360 days    TSH  Date Value Ref Range Status  02/05/2021 3.140 0.450 - 4.500 uIU/mL Final         Passed - Valid encounter within last 12 months    Recent Outpatient Visits           4 months ago Diffuse abdominal pain   [facility] Jerrol Banana., MD   5 months ago Diffuse abdominal pain   [facility] Jerrol Banana., MD   11 months ago Chronic pain of right knee   [facility] Jerrol Banana., MD   1 year ago Chronic pain syndrome   [facility] Jerrol Banana., MD   1 year ago Cough   [facility] Jerrol Banana., MD       Future Appointments             In 1 month Simmons-Robinson, Riki Sheer, MD [facility], [place]

## 2021-12-03 NOTE — Telephone Encounter (Signed)
Unable to refill per protocol, Rx request is too soon. Last RF 04/07/20 for 90 and 12 RF. Will refuse.  Requested Prescriptions  Pending Prescriptions Disp Refills  . TRINTELLIX 20 MG TABS tablet [Pharmacy Med Name: TRINTELLIX 20MG  TAB] 90 tablet 2    Sig: TAKE ONE TABLET BY MOUTH EVERY DAY     Psychiatry: Antidepressants - Serotonin Modulator Passed - 12/03/2021  2:53 PM      Passed - Completed PHQ-2 or PHQ-9 in the last 360 days      Passed - Valid encounter within last 6 months    Recent Outpatient Visits          4 months ago Diffuse abdominal pain   [facility] Jerrol Banana., MD   5 months ago Diffuse abdominal pain   [facility] Jerrol Banana., MD   11 months ago Chronic pain of right knee   [facility] Jerrol Banana., MD   1 year ago Chronic pain syndrome   [facility] Jerrol Banana., MD   1 year ago Cough   [facility] Jerrol Banana., MD      Future Appointments            In 1 month Simmons-Robinson, Riki Sheer, MD [facility], [place]

## 2021-12-03 NOTE — Telephone Encounter (Signed)
Requested Prescriptions  Pending Prescriptions Disp Refills  . rosuvastatin (CRESTOR) 10 MG tablet 90 tablet 0    Sig: Take 1 tablet (10 mg total) by mouth daily.     Cardiovascular:  Antilipid - Statins 2 Failed - 12/03/2021  4:14 PM      Failed - Cr in normal range and within 360 days    Creatinine  Date Value Ref Range Status  10/31/2013 1.39 (H) 0.60 - 1.30 mg/dL Final   Creatinine, Ser  Date Value Ref Range Status  02/05/2021 1.13 (H) 0.57 - 1.00 mg/dL Final         Failed - Lipid Panel in normal range within the last 12 months    Cholesterol, Total  Date Value Ref Range Status  07/22/2021 120 100 - 199 mg/dL Final   LDL Chol Calc (NIH)  Date Value Ref Range Status  07/22/2021 53 0 - 99 mg/dL Final   HDL  Date Value Ref Range Status  07/22/2021 52 >39 mg/dL Final   Triglycerides  Date Value Ref Range Status  07/22/2021 75 0 - 149 mg/dL Final         Passed - Patient is not pregnant      Passed - Valid encounter within last 12 months    Recent Outpatient Visits          4 months ago Diffuse abdominal pain   [facility] Jerrol Banana., MD   5 months ago Diffuse abdominal pain   [facility] Jerrol Banana., MD   11 months ago Chronic pain of right knee   [facility] Jerrol Banana., MD   1 year ago Chronic pain syndrome   [facility] Jerrol Banana., MD   1 year ago Cough   [facility] Jerrol Banana., MD      Future Appointments            In 1 month Simmons-Robinson, Riki Sheer, MD [facility], [place]           . omeprazole (PRILOSEC) 20 MG capsule 90 capsule 0    Sig: Take 1 capsule (20 mg total) by mouth daily.     Gastroenterology: Proton Pump Inhibitors Passed - 12/03/2021  4:14 PM      Passed - Valid encounter within last 12 months    Recent Outpatient Visits          4 months ago Diffuse abdominal pain   [facility]   5 months ago Diffuse abdominal pain   [facility] Jerrol Banana., MD   11 months ago Chronic pain of right knee   [facility] Jerrol Banana., MD   1 year ago Chronic pain syndrome   [facility] Jer[facility] Banana., MD   1 year ago Cough   [facility] Jerrol Banana., MD      Future Appointments            In 1 month Simmons-Robinson, Riki Sheer, MD [facility]           . levothyroxine (SYNTHROID) 25 MCG tablet 90 tablet 0    Sig: Take 1 tablet (25 mcg total) by mouth daily.     Endocrinology:  Hypothyroid Agents Passed - 12/03/2021  4:14 PM      Passed - TSH in normal range and within 360 days  TSH  Date Value Ref Range Status  02/05/2021 3.140 0.450 - 4.500 uIU/mL Final         Passed - Valid encounter within last 12 months    Recent Outpatient Visits          4 months ago Diffuse abdominal pain   [facility] Jerrol Banana., MD   5 months ago Diffuse abdominal pain   [facility] Jerrol Ban[facility]., MD   11 months ago Chronic pain of right knee   [facility] Jerrol Banana., MD   1 year ago Chronic pain syndrome   [facility] Jerrol Banana., MD   1 year ago Cough   [facility] Jerrol Banana., MD      Future Appointments            In 1 month Simmons-Robinson, Riki Sheer, MD [facility] [place]           . clonazePAM (KLONOPIN) 0.5 MG tablet 30 tablet 2    Sig: Take 1 tablet (0.5 mg total) by mouth at bedtime as needed.     Not Delegated - Psychiatry: Anxiolytics/Hypnotics 2 Failed - 12/03/2021  4:14 PM      Failed - This refill cannot be delegated      Failed - Urine Drug Screen completed in last 360 days      Passed - Patient is not pregnant      Passed - Valid encounter within last 6  months    Recent Outpatient Visits          4 months ago Diffuse abdominal pain   [facility] Jerrol Banana., MD   5 months ago Diffuse abdominal pain   [facility] Jerrol Banana., MD   11 months ago Chronic pain of right knee   [facility] Jerrol Banana., MD   1 year ago Chronic pain syndrome   [facility] Jerrol Banana., MD   1 year ago Cough   [facility] Jerrol Banana., MD      Future Appointments            In 1 month Simmons-Robinson, Riki Sheer, MD [facility], [place]

## 2021-12-04 MED ORDER — CLONAZEPAM 0.5 MG PO TABS: 0.5000 mg | ORAL_TABLET | Freq: Every evening | ORAL | 3 refills | Status: DC | PRN

## 2021-12-15 ENCOUNTER — Ambulatory Visit: Payer: Medicare HMO

## 2022-01-12 ENCOUNTER — Other Ambulatory Visit: Payer: Self-pay | Admitting: Family Medicine

## 2022-01-12 DIAGNOSIS — G894 Chronic pain syndrome: Secondary | ICD-10-CM

## 2022-01-12 DIAGNOSIS — F329 Major depressive disorder, single episode, unspecified: Secondary | ICD-10-CM

## 2022-01-12 NOTE — Telephone Encounter (Signed)
Requested medication (s) are due for refill today: yes  Requested medication (s) are on the active medication list: yes  Last refill:  11/29/21 #60/0 for Ritalin, 11/29/21 #80/0 Oxycodone  Future visit scheduled: yes  Notes to clinic: Unable to refill per protocol, cannot delegate.    Requested Prescriptions  Pending Prescriptions Disp Refills   methylphenidate (RITALIN) 10 MG tablet [Pharmacy Med Name: METHYLPHENIDATE 10 MG TABLET] 60 tablet 0    Sig: TAKE ONE TABLET BY MOUTH TWICE A DAY **NEEDS APPOINTMENT WITH NEW PROVIDER**     Not Delegated - Psychiatry:  Stimulants/ADHD Failed - 01/12/2022  4:34 PM      Failed - This refill cannot be delegated      Failed - Urine Drug Screen completed in last 360 days      Failed - Last BP in normal range    BP Readings from Last 1 Encounters:  07/21/21 (!) 145/82         Passed - Last Heart Rate in normal range    Pulse Readings from Last 1 Encounters:  07/21/21 79         Passed - Valid encounter within last 6 months    Recent Outpatient Visits           5 months ago Diffuse abdominal pain   [facility] Maple Hudson., MD   7 months ago Diffuse abdominal pain   [facility] MD   1 year ago Chronic pain of right knee   [facility] Maple Hudson., MD   1 year ago Chronic pain syndrome   [facility] Maple Hudson., MD   1 year ago Cough   [facility] Maple Hudson., MD       Future Appointments             In 6 days Simmons-Robinson, Tawanna Cooler, MD [facility]             oxyCODONE (ROXICODONE) 15 MG immediate release tablet [Pharmacy Med Name: OXYCODONE 15 MG TAB[D]] 80 tablet 0    Sig: TAKE ONE TABLET BY MOUTH EVERY 8 HOURS RPN FOR PAIN     Not Delegated - Analgesics:  Opioid Agonists Failed - 01/12/2022  4:34 PM      Failed - This refill cannot be delegated       Failed - Urine Drug Screen completed in last 360 days      Failed - Valid encounter within last 3 months    Recent Outpatient Visits           5 months ago Diffuse abdominal pain   [facility] Maple Hudson., MD   7 months ago Diffuse abdominal pain   [facility] Maple Hudson., MD   1 year ago Chronic pain of right knee   [facility] Maple Hudson., MD   1 year ago Chronic pain syndrome   [facility] Maple Hudson., MD   1 year ago Cough   [facility] Maple Hudson., MD       Future Appointments             In 6 days Simmons-Robinson, Tawanna Cooler, MD [facility]

## 2022-01-18 ENCOUNTER — Encounter: Payer: Medicare HMO | Admitting: Family Medicine

## 2022-01-18 DIAGNOSIS — Z0000 Encounter for general adult medical examination without abnormal findings: Secondary | ICD-10-CM | POA: Insufficient documentation

## 2022-01-18 NOTE — Progress Notes (Deleted)
Annual Wellness Visit     Patient: Hannah Vance, Female    DOB: 03-May-1955, 66 y.o.   MRN: [id number] Visit Date: 01/18/2022  Today's Provider: [name], MD   No chief complaint on file.  Subjective    Hannah Vance is a 66 y.o. female who presents today for her Annual Wellness Visit. She reports consuming a {diet types:17450} diet. {Exercise:19826} She generally feels {well/fairly well/poorly:18703}. She reports sleeping {well/fairly well/poorly:18703}. She {does/does not:200015} have additional problems to discuss today.   HPI    Medications: Outpatient Medications Prior to Visit  Medication Sig  . clonazePAM (KLONOPIN) 0.5 MG tablet Take 1 tablet (0.5 mg total) by mouth at bedtime as needed.  . cyclobenzaprine (FLEXERIL) 5 MG tablet Take 1 tablet (5 mg total) by mouth 3 (three) times daily as needed for muscle spasms.  . fluticasone (FLONASE) 50 MCG/ACT nasal spray Place 2 sprays into both nostrils daily.  [place] Tea, Camellia sinensis, (GREEN TEA PO) Take by mouth.  . levothyroxine (SYNTHROID) 25 MCG tablet Take 1 tablet (25 mcg total) by mouth daily.  Marland Kitchen loratadine (CLARITIN) 10 MG tablet Take 1 tablet (10 mg total) by mouth daily.  . methylphenidate (RITALIN) 10 MG tablet TAKE ONE TABLET BY MOUTH TWICE A DAY **NEEDS APPOINTMENT WITH NEW PROVIDER**  . omeprazole (PRILOSEC) 20 MG capsule Take 1 capsule (20 mg total) by mouth daily.  Marland Kitchen oxyCODONE (ROXICODONE) 15 MG immediate release tablet TAKE ONE TABLET BY MOUTH EVERY 8 HOURS RPN FOR PAIN  . rosuvastatin (CRESTOR) 10 MG tablet Take 1 tablet (10 mg total) by mouth daily.  . SUMAtriptan (IMITREX) 100 MG tablet Take 1 tablet at onset of migraine. May repeat in 2 hrs. Max 2/day and 2-3 days/week.  . vortioxetine HBr (TRINTELLIX) 20 MG TABS tablet Take 1 tablet (20 mg total) by mouth daily.   No facility-administered medications prior to visit.    Allergies  Allergen Reactions  . Amoxicillin Other  (See Comments)  . Morphine Other (See Comments)    Trouble walking, heavy legs  . Quetiapine Fumarate Other (See Comments)    Mouth and tongue numbness   . Septra  [Sulfamethoxazole-Trimethoprim]   . Sulfa Antibiotics Other (See Comments)    burning  . Methadone Nausea And Vomiting    Patient Care Team: Maple Hudson., MD as PCP - General (Family Medicine)  Review of Systems  All other systems reviewed and are negative.  {Labs  Heme  Chem  Endocrine  Serology  Results Review (optional):23779}    Objective    Vitals: There were no vitals taken for this visit. {Show previous vital signs (optional):23777}   Physical Exam ***  Most recent functional status assessment:     No data to display         Most recent fall risk assessment:    12/15/2020   11:27 AM  Fall Risk   Falls in the past year? 0  Number falls in past yr: 0  Injury with Fall? 0  Risk for fall due to : No Fall Risks  Follow up Falls evaluation completed    Most recent depression screenings:    12/15/2020   11:27 AM 01/06/2020    4:18 PM  PHQ 2/9 Scores  PHQ - 2 Score 2 1  PHQ- 9 Score 10 7   Most recent cognitive screening:     No data to display         Most recent Audit-C  alcohol use screening    12/15/2020   11:26 AM  Alcohol Use Disorder Test (AUDIT)  1. How often do you have a drink containing alcohol? 0  2. How many drinks containing alcohol do you have on a typical day when you are drinking? 0  3. How often do you have six or more drinks on one occasion? 0  AUDIT-C Score 0   A score of 3 or more in women, and 4 or more in men indicates increased risk for alcohol abuse, EXCEPT if all of the points are from question 1   No results found for any visits on 01/18/22.  Assessment & Plan     Annual wellness visit done today including the all of the following: Reviewed patient's Family Medical History Reviewed and updated list of patient's medical providers Assessment  of cognitive impairment was done Assessed patient's functional ability Established a written schedule for health screening services Health Risk Assessent Completed and Reviewed  Exercise Activities and Dietary recommendations  Goals   None     Immunization History  Administered Date(s) Administered  . Influenza,inj,Quad PF,6+ Mos 11/03/2014, 11/30/2015, 12/26/2018, 01/06/2020, 12/15/2020  . Tdap 08/15/2016    Health Maintenance  Topic Date Due  . Medicare Annual Wellness (AWV)  Never done  . COVID-19 Vaccine (1) Never done  . Pneumonia Vaccine 109+ Years old (1 - PCV) Never done  . HIV Screening  Never done  . Hepatitis C Screening  Never done  . Zoster Vaccines- Shingrix (1 of 2) Never done  . MAMMOGRAM  12/06/2019  . DEXA SCAN  Never done  . INFLUENZA VACCINE  09/07/2021  . Fecal DNA (Cologuard)  02/04/2024  . DTaP/Tdap/Td (2 - Td or Tdap) 08/16/2026  . HPV VACCINES  Aged Out     Discussed health benefits of physical activity, and encouraged her to engage in regular exercise appropriate for her age and condition.    ***  No follow-ups on file.     {provider attestation***:1}   [name], MD  [facility] [phone] (phone) [phone] (fax)  [facility]

## 2022-02-04 ENCOUNTER — Encounter: Payer: Medicare HMO | Admitting: Family Medicine

## 2022-02-04 NOTE — Progress Notes (Deleted)
Annual Wellness Visit     Patient: Hannah Vance, Female    DOB: [birth year], 65 y.o.   MRN: [id number] Visit Date: 02/09/2022  Today's Provider: [name], MD   No chief complaint on file.  Subjective    Hannah Vance is a 66 y.o. female who presents today for her Annual Wellness Visit.  She reports consuming a {diet types:17450} diet. {Exercise:19826} She generally feels {well/fairly well/poorly:18703}. She reports sleeping {well/fairly well/poorly:18703}. She {does/does not:200015} have additional problems to discuss today.   HPI  Annual Wellness Recommendations  Recommended COVID, Pneumonia & Shingrix vaccines *** Recommended Hep C screening *** Medications: Outpatient Medications Prior to Visit  Medication Sig   clonazePAM (KLONOPIN) 0.5 MG tablet Take 1 tablet (0.5 mg total) by mouth at bedtime as needed.   cyclobenzaprine (FLEXERIL) 5 MG tablet Take 1 tablet (5 mg total) by mouth 3 (three) times daily as needed for muscle spasms.   fluticasone (FLONASE) 50 MCG/ACT nasal spray Place 2 sprays into both nostrils daily.   Green Tea, Camellia sinensis, (GREEN TEA PO) Take by mouth.   levothyroxine (SYNTHROID) 25 MCG tablet Take 1 tablet (25 mcg total) by mouth daily.   loratadine (CLARITIN) 10 MG tablet Take 1 tablet (10 mg total) by mouth daily.   methylphenidate (RITALIN) 10 MG tablet TAKE ONE TABLET BY MOUTH TWICE A DAY **NEEDS APPOINTMENT WITH NEW PROVIDER**   omeprazole (PRILOSEC) 20 MG capsule Take 1 capsule (20 mg total) by mouth daily.   oxyCODONE (ROXICODONE) 15 MG immediate release tablet TAKE ONE TABLET BY MOUTH EVERY 8 HOURS RPN FOR PAIN   rosuvastatin (CRESTOR) 10 MG tablet Take 1 tablet (10 mg total) by mouth daily.   SUMAtriptan (IMITREX) 100 MG tablet Take 1 tablet at onset of migraine. May repeat in 2 hrs. Max 2/day and 2-3 days/week.   vortioxetine HBr (TRINTELLIX) 20 MG TABS tablet Take 1 tablet (20 mg total) by mouth daily.   No  facility-administered medications prior to visit.    Allergies  Allergen Reactions   Amoxicillin Other (See Comments)   Morphine Other (See Comments)    Trouble walking, heavy legs   Quetiapine Fumarate Other (See Comments)    Mouth and tongue numbness    Septra  [Sulfamethoxazole-Trimethoprim]    Sulfa Antibiotics Other (See Comments)    burning   Methadone Nausea And Vomiting    Patient Care Team: Maple Hudson., MD as PCP - General (Family Medicine)  Review of Systems  All other systems reviewed and are negative.   Last CBC Lab Results  Component Value Date   WBC 7.4 02/05/2021   HGB 14.6 02/05/2021   HCT 41.6 02/05/2021   MCV 88 02/05/2021   MCH 30.9 02/05/2021   RDW 12.3 02/05/2021   PLT 255 02/05/2021   Last metabolic panel Lab Results  Component Value Date   GLUCOSE 106 (H) 02/05/2021   NA 137 02/05/2021   K 4.0 02/05/2021   CL 99 02/05/2021   CO2 22 02/05/2021   BUN 8 02/05/2021   CREATININE 1.13 (H) 02/05/2021   EGFR 54 (L) 02/05/2021   CALCIUM 9.6 02/05/2021   PROT 6.5 02/05/2021   ALBUMIN 4.4 02/05/2021   LABGLOB 2.1 02/05/2021   AGRATIO 2.1 02/05/2021   BILITOT 0.6 02/05/2021   ALKPHOS 87 02/05/2021   AST 19 02/05/2021   ALT 17 02/05/2021   ANIONGAP 12 10/31/2013   Last lipids Lab Results  Component Value Date   CHOL 120  07/22/2021   HDL 52 07/22/2021   LDLCALC 53 07/22/2021   TRIG 75 07/22/2021   CHOLHDL 2.3 07/22/2021   Last hemoglobin A1c Lab Results  Component Value Date   HGBA1C 5.5 07/06/2016   Last thyroid functions Lab Results  Component Value Date   TSH 3.140 02/05/2021   T3TOTAL 113 07/06/2016   T4TOTAL 7.9 07/06/2016   Last vitamin B12 and Folate Lab Results  Component Value Date   VITAMINB12 507 07/06/2016        Objective    Vitals: There were no vitals taken for this visit. BP Readings from Last 3 Encounters:  07/21/21 (!) 145/82  06/15/21 125/84  12/15/20 119/79   Wt Readings from Last 3  Encounters:  07/21/21 151 lb (68.5 kg)  06/15/21 153 lb (69.4 kg)  12/15/20 164 lb (74.4 kg)       Physical Exam ***  Most recent functional status assessment:     No data to display         Most recent fall risk assessment:    12/15/2020   11:27 AM  [place] in the past year? 0  Number falls in past yr: 0  Injury with Fall? 0  Risk for fall due to : No Fall Risks  Follow up Falls evaluation completed    Most recent depression screenings:    12/15/2020   11:27 AM 01/06/2020    4:18 PM  PHQ 2/9 Scores  PHQ - 2 Score 2 1  PHQ- 9 Score 10 7   Most recent cognitive screening:     No data to display         Most recent Audit-C alcohol use screening    12/15/2020   11:26 AM  Alcohol Use Disorder Test (AUDIT)  1. How often do you have a drink containing alcohol? 0  2. How many drinks containing alcohol do you have on a typical day when you are drinking? 0  3. How often do you have six or more drinks on one occasion? 0  AUDIT-C Score 0   A score of 3 or more in women, and 4 or more in men indicates increased risk for alcohol abuse, EXCEPT if all of the points are from question 1   No results found for any visits on 02/09/22.  Assessment & Plan     Annual wellness visit done today including the all of the following: Reviewed patient's Family Medical History Reviewed and updated list of patient's medical providers Assessment of cognitive impairment was done Assessed patient's functional ability Established a written schedule for health screening [place] Completed and Reviewed  Exercise Activities and Dietary recommendations  Goals   None     Immunization History  Administered Date(s) Administered   Influenza,inj,Quad PF,6+ Mos 11/03/2014, 11/30/2015, 12/26/2018, 01/06/2020, 12/15/2020   Tdap 08/15/2016    Health Maintenance  Topic Date Due   Medicare Annual Wellness (AWV)  Never done   COVID-19 Vaccine (1) Never  done   Pneumonia Vaccine 11+ Years old (1 - PCV) Never done   HIV Screening  Never done   Hepatitis C Screening  Never done   Zoster Vaccines- Shingrix (1 of 2) Never done   MAMMOGRAM  12/06/2019   DEXA SCAN  Never done   INFLUENZA VACCINE  09/07/2021   Fecal DNA (Cologuard)  02/04/2024   DTaP/Tdap/Td (2 - Td or Tdap) 08/16/2026   HPV VACCINES  Aged Out     Discussed health  benefits of physical activity, and encouraged her to engage in regular exercise appropriate for her age and condition.    Problem List Items Addressed This Visit       Other   Hypercholesteremia   Avitaminosis D   Other Visit Diagnoses     Encounter for subsequent annual wellness visit (AWV) in Medicare patient    -  Primary   B12 deficiency       Adult hypothyroidism       Postmenopausal       Need for pneumococcal 20-valent conjugate vaccination           No follow-ups on file.     I, [name], MD, have reviewed all documentation for this visit.  Portions of this information were initially documented by the CMA and reviewed by me for thoroughness and accuracy.      [name], MD  [facility] [phone] (phone) [phone] (fax)  [place]

## 2022-02-04 NOTE — Progress Notes (Deleted)
Annual Wellness Visit     Patient: Hannah Vance, Female    DOB: 03-Jul-1955, 66 y.o.   MRN: [id number] Visit Date: 02/04/2022  Today's Provider: [name], MD   No chief complaint on file.  Subjective    Hannah Vance is a 66 y.o. female who presents today for her Annual Wellness Visit. She reports consuming a {diet types:17450} diet. {Exercise:19826} She generally feels {well/fairly well/poorly:18703}. She reports sleeping {well/fairly well/poorly:18703}. She {does/does not:200015} have additional problems to discuss today.   HPI    Medications: Outpatient Medications Prior to Visit  Medication Sig   clonazePAM (KLONOPIN) 0.5 MG tablet Take 1 tablet (0.5 mg total) by mouth at bedtime as needed.   cyclobenzaprine (FLEXERIL) 5 MG tablet Take 1 tablet (5 mg total) by mouth 3 (three) times daily as needed for muscle spasms.   fluticasone (FLONASE) 50 MCG/ACT nasal spray Place 2 sprays into both nostrils daily.   Green Tea, Camellia sinensis, (GREEN TEA PO) Take by mouth.   levothyroxine (SYNTHROID) 25 MCG tablet Take 1 tablet (25 mcg total) by mouth daily.   loratadine (CLARITIN) 10 MG tablet Take 1 tablet (10 mg total) by mouth daily.   methylphenidate (RITALIN) 10 MG tablet TAKE ONE TABLET BY MOUTH TWICE A DAY **NEEDS APPOINTMENT WITH NEW PROVIDER**   omeprazole (PRILOSEC) 20 MG capsule Take 1 capsule (20 mg total) by mouth daily.   oxyCODONE (ROXICODONE) 15 MG immediate release tablet TAKE ONE TABLET BY MOUTH EVERY 8 HOURS RPN FOR PAIN   rosuvastatin (CRESTOR) 10 MG tablet Take 1 tablet (10 mg total) by mouth daily.   SUMAtriptan (IMITREX) 100 MG tablet Take 1 tablet at onset of migraine. May repeat in 2 hrs. Max 2/day and 2-3 days/week.   vortioxetine HBr (TRINTELLIX) 20 MG TABS tablet Take 1 tablet (20 mg total) by mouth daily.   No facility-administered medications prior to visit.    Allergies  Allergen Reactions   Amoxicillin Other (See Comments)    Morphine Other (See Comments)    Trouble walking, heavy legs   Quetiapine Fumarate Other (See Comments)    Mouth and tongue numbness    Septra  [Sulfamethoxazole-Trimethoprim]    Sulfa Antibiotics Other (See Comments)    burning   Methadone Nausea And Vomiting    Patient Care Team: Jerrol Banana., MD as PCP - General (Family Medicine)  Review of Systems  {Labs  Heme  Chem  Endocrine  Serology  Results Review (optional):23779}    Objective    Vitals: There were no vitals taken for this visit. {Show previous vital signs (optional):23777}   Physical Exam ***  Most recent functional status assessment:     No data to display         Most recent fall risk assessment:    12/15/2020   11:27 AM  [place] in the past year? 0  Number falls in past yr: 0  Injury with Fall? 0  Risk for fall due to : No Fall Risks  Follow up Falls evaluation completed    Most recent depression screenings:    12/15/2020   11:27 AM 01/06/2020    4:18 PM  PHQ 2/9 Scores  PHQ - 2 Score 2 1  PHQ- 9 Score 10 7   Most recent cognitive screening:     No data to display         Most recent Audit-C alcohol use screening    12/15/2020  11:26 AM  Alcohol Use Disorder Test (AUDIT)  1. How often do you have a drink containing alcohol? 0  2. How many drinks containing alcohol do you have on a typical day when you are drinking? 0  3. How often do you have six or more drinks on one occasion? 0  AUDIT-C Score 0   A score of 3 or more in women, and 4 or more in men indicates increased risk for alcohol abuse, EXCEPT if all of the points are from question 1   No results found for any visits on 02/04/22.  Assessment & Plan     Annual wellness visit done today including the all of the following: Reviewed patient's Family Medical History Reviewed and updated list of patient's medical providers Assessment of cognitive impairment was done Assessed patient's functional  ability Established a written schedule for health screening services Health Risk Assessent Completed and Reviewed  Exercise Activities and Dietary recommendations  Goals   None     Immunization History  Administered Date(s) Administered   Influenza,inj,Quad PF,6+ Mos 11/03/2014, 11/30/2015, 12/26/2018, 01/06/2020, 12/15/2020   Tdap 08/15/2016    Health Maintenance  Topic Date Due   Medicare Annual Wellness (AWV)  Never done   COVID-19 Vaccine (1) Never done   Pneumonia Vaccine 78+ Years old (1 - PCV) Never done   HIV Screening  Never done   Hepatitis C Screening  Never done   Zoster Vaccines- Shingrix (1 of 2) Never done   MAMMOGRAM  12/06/2019   DEXA SCAN  Never done   INFLUENZA VACCINE  09/07/2021   Fecal DNA (Cologuard)  02/04/2024   DTaP/Tdap/Td (2 - Td or Tdap) 08/16/2026   HPV VACCINES  Aged Out     Discussed health benefits of physical activity, and encouraged her to engage in regular exercise appropriate for her age and condition.    ***  No follow-ups on file.     {provider attestation***:1}   [name], MD  [facility] [phone] (phone) [phone] (fax)  [facility]

## 2022-02-09 ENCOUNTER — Encounter: Payer: Medicare HMO | Admitting: Family Medicine

## 2022-02-09 DIAGNOSIS — E559 Vitamin D deficiency, unspecified: Secondary | ICD-10-CM

## 2022-02-09 DIAGNOSIS — E7800 Pure hypercholesterolemia, unspecified: Secondary | ICD-10-CM

## 2022-02-09 DIAGNOSIS — E039 Hypothyroidism, unspecified: Secondary | ICD-10-CM

## 2022-02-09 DIAGNOSIS — Z0000 Encounter for general adult medical examination without abnormal findings: Secondary | ICD-10-CM

## 2022-02-09 DIAGNOSIS — Z780 Asymptomatic menopausal state: Secondary | ICD-10-CM

## 2022-02-09 DIAGNOSIS — Z23 Encounter for immunization: Secondary | ICD-10-CM

## 2022-02-09 DIAGNOSIS — E538 Deficiency of other specified B group vitamins: Secondary | ICD-10-CM

## 2022-02-22 DIAGNOSIS — R112 Nausea with vomiting, unspecified: Secondary | ICD-10-CM | POA: Diagnosis not present

## 2022-02-22 DIAGNOSIS — E785 Hyperlipidemia, unspecified: Secondary | ICD-10-CM | POA: Diagnosis not present

## 2022-02-22 DIAGNOSIS — R109 Unspecified abdominal pain: Secondary | ICD-10-CM | POA: Diagnosis not present

## 2022-02-22 DIAGNOSIS — R69 Illness, unspecified: Secondary | ICD-10-CM | POA: Diagnosis not present

## 2022-02-22 DIAGNOSIS — K5730 Diverticulosis of large intestine without perforation or abscess without bleeding: Secondary | ICD-10-CM | POA: Diagnosis not present

## 2022-02-22 DIAGNOSIS — Z1211 Encounter for screening for malignant neoplasm of colon: Secondary | ICD-10-CM | POA: Diagnosis not present

## 2022-02-22 DIAGNOSIS — Z79899 Other long term (current) drug therapy: Secondary | ICD-10-CM | POA: Diagnosis not present

## 2022-02-22 DIAGNOSIS — K219 Gastro-esophageal reflux disease without esophagitis: Secondary | ICD-10-CM | POA: Diagnosis not present

## 2022-02-22 DIAGNOSIS — D125 Benign neoplasm of sigmoid colon: Secondary | ICD-10-CM | POA: Diagnosis not present

## 2022-02-22 DIAGNOSIS — I341 Nonrheumatic mitral (valve) prolapse: Secondary | ICD-10-CM | POA: Diagnosis not present

## 2022-02-22 DIAGNOSIS — Z87891 Personal history of nicotine dependence: Secondary | ICD-10-CM | POA: Diagnosis not present

## 2022-02-22 DIAGNOSIS — K30 Functional dyspepsia: Secondary | ICD-10-CM | POA: Diagnosis not present

## 2022-02-22 DIAGNOSIS — K635 Polyp of colon: Secondary | ICD-10-CM | POA: Diagnosis not present

## 2022-02-22 DIAGNOSIS — K5900 Constipation, unspecified: Secondary | ICD-10-CM | POA: Diagnosis not present

## 2022-02-22 DIAGNOSIS — Z882 Allergy status to sulfonamides status: Secondary | ICD-10-CM | POA: Diagnosis not present

## 2022-02-22 DIAGNOSIS — N189 Chronic kidney disease, unspecified: Secondary | ICD-10-CM | POA: Diagnosis not present

## 2022-02-22 DIAGNOSIS — Z79890 Hormone replacement therapy (postmenopausal): Secondary | ICD-10-CM | POA: Diagnosis not present

## 2022-02-22 DIAGNOSIS — K2950 Unspecified chronic gastritis without bleeding: Secondary | ICD-10-CM | POA: Diagnosis not present

## 2022-02-23 DIAGNOSIS — D125 Benign neoplasm of sigmoid colon: Secondary | ICD-10-CM | POA: Diagnosis not present

## 2022-02-23 DIAGNOSIS — K2950 Unspecified chronic gastritis without bleeding: Secondary | ICD-10-CM | POA: Diagnosis not present

## 2022-03-02 ENCOUNTER — Other Ambulatory Visit: Payer: Self-pay | Admitting: Family Medicine

## 2022-03-02 DIAGNOSIS — F330 Major depressive disorder, recurrent, mild: Secondary | ICD-10-CM

## 2022-03-03 ENCOUNTER — Ambulatory Visit (INDEPENDENT_AMBULATORY_CARE_PROVIDER_SITE_OTHER): Payer: Medicare HMO | Admitting: Family Medicine

## 2022-03-03 ENCOUNTER — Encounter: Payer: Self-pay | Admitting: Family Medicine

## 2022-03-03 VITALS — BP 138/91 | HR 77 | Temp 99.0°F | Resp 16 | Ht 62.0 in | Wt 152.0 lb

## 2022-03-03 DIAGNOSIS — F330 Major depressive disorder, recurrent, mild: Secondary | ICD-10-CM

## 2022-03-03 DIAGNOSIS — F419 Anxiety disorder, unspecified: Secondary | ICD-10-CM

## 2022-03-03 DIAGNOSIS — M6289 Other specified disorders of muscle: Secondary | ICD-10-CM | POA: Diagnosis not present

## 2022-03-03 DIAGNOSIS — E039 Hypothyroidism, unspecified: Secondary | ICD-10-CM

## 2022-03-03 DIAGNOSIS — F329 Major depressive disorder, single episode, unspecified: Secondary | ICD-10-CM | POA: Diagnosis not present

## 2022-03-03 DIAGNOSIS — E7800 Pure hypercholesterolemia, unspecified: Secondary | ICD-10-CM | POA: Diagnosis not present

## 2022-03-03 DIAGNOSIS — G894 Chronic pain syndrome: Secondary | ICD-10-CM

## 2022-03-03 DIAGNOSIS — K219 Gastro-esophageal reflux disease without esophagitis: Secondary | ICD-10-CM | POA: Diagnosis not present

## 2022-03-03 DIAGNOSIS — R69 Illness, unspecified: Secondary | ICD-10-CM | POA: Diagnosis not present

## 2022-03-03 MED ORDER — CLONAZEPAM 0.5 MG PO TABS: 0.5000 mg | ORAL_TABLET | Freq: Every evening | ORAL | 3 refills | Status: DC | PRN

## 2022-03-03 MED ORDER — NALOXONE HCL 4 MG/0.1ML NA LIQD: NASAL | 3 refills | Status: DC

## 2022-03-03 MED ORDER — CYCLOBENZAPRINE HCL 5 MG PO TABS: 5.0000 mg | ORAL_TABLET | Freq: Three times a day (TID) | ORAL | 5 refills | Status: DC | PRN

## 2022-03-03 MED ORDER — TRINTELLIX 20 MG PO TABS: 20.0000 mg | ORAL_TABLET | Freq: Every day | ORAL | 0 refills | Status: DC

## 2022-03-03 MED ORDER — LEVOTHYROXINE SODIUM 25 MCG PO TABS: 25.0000 ug | ORAL_TABLET | Freq: Every day | ORAL | 0 refills | Status: DC

## 2022-03-03 MED ORDER — SUMATRIPTAN SUCCINATE 100 MG PO TABS: ORAL_TABLET | ORAL | 0 refills | Status: DC

## 2022-03-03 MED ORDER — OXYCODONE HCL 15 MG PO TABS: ORAL_TABLET | ORAL | 0 refills | Status: DC

## 2022-03-03 MED ORDER — OMEPRAZOLE 20 MG PO CPDR: 20.0000 mg | DELAYED_RELEASE_CAPSULE | Freq: Every day | ORAL | 0 refills | Status: DC

## 2022-03-03 MED ORDER — METHYLPHENIDATE HCL 10 MG PO TABS: ORAL_TABLET | ORAL | 0 refills | Status: DC

## 2022-03-03 MED ORDER — ROSUVASTATIN CALCIUM 10 MG PO TABS: 10.0000 mg | ORAL_TABLET | Freq: Every day | ORAL | 1 refills | Status: DC

## 2022-03-03 NOTE — Assessment & Plan Note (Signed)
Stable Symptoms well controlled  Continue current treatment plan  Refills provided for Synthroid 25mg  daily per patient request Will need thyroid levels checked at next CPE

## 2022-03-03 NOTE — Progress Notes (Signed)
I,Hannah Vance,acting as a scribe for [employer], MD.,have documented all relevant documentation on the behalf of Hannah Ramp, MD,as directed by  Hannah Ramp, MD while in the presence of Hannah Ramp, MD.   Established patient visit   Patient: Hannah Vance   DOB: 1955-03-13   67 y.o. Female  MRN: [id number] Visit Date: 03/03/2022  Today's healthcare provider: [name], MD   Chief Complaint  Patient presents with   follow-up depression and Anxiety   follow-up chronic disease   Subjective    HPI   Patient declined flu vaccine.  Depression and Anxiety, Follow-up  She  was last seen for this 6 months ago. Changes made at last visit include none.Continue Trintellix 20 mg, Ritalin 10 mg and Klonopin 0.5 mg   She reports excellent compliance with treatment.  Current symptoms include: depressed mood, difficulty concentrating, fatigue, feelings of worthlessness/guilt, and hopelessness She feels she is Unchanged since last visit. No longer sees psychiatry bc provider no longer in practice  She takes klonopin when anxiety increases due to pelvic floor pain and problems with ears ringing, may have good day and not take it at all      03/03/2022   11:32 AM 12/15/2020   11:27 AM 01/06/2020    4:18 PM  Depression screen PHQ 2/9  Decreased Interest 1 1 0  Down, Depressed, Hopeless 1 1 1   PHQ - 2 Score 2 2 1   Altered sleeping 1 2 1   Tired, decreased energy 2 1 1   Change in appetite 3 2 1   Feeling bad or failure about yourself  1 1 2   Trouble concentrating 1 2 1   Moving slowly or fidgety/restless 0 0 0  Suicidal thoughts 0 0 0  PHQ-9 Score 10 10 7   Difficult doing work/chores Somewhat difficult Somewhat difficult Not difficult at all      03/03/2022   11:29 AM 01/06/2020    4:19 PM 11/04/2019    4:00 PM 06/19/2019    2:25 PM  GAD 7 : Generalized Anxiety Score  Nervous, Anxious, on Edge 3 1 2 3    Control/stop worrying 1 0 1 2  Worry too much - different things 1 1 1 2   Trouble relaxing 1 1 1 2   Restless 0 1 0 1  Easily annoyed or irritable 1 0 0 1  Afraid - awful might happen 0 1 0 1  Total GAD 7 Score 7 5 5 12   Anxiety Difficulty Somewhat difficult Somewhat difficult Not difficult at all Very difficult   Health Updates  Additional Hx provided by patient    Patient wants to know if it is ok to take magnesium 400 mg due to her kidney disease. Recommended that she hold off on refills until we are able to check magnesium levels.  She reports having rectal spasms  She sees Dr. in [place] for trigger point injections into her rectum, states these are painful but the only thing that helps her to have bowel movements and decrease chronic pelvic pain severity   Sees neurology, Dr. at [facility]   She states that she is evaluated at [facility] for knee problems, is planning to have knee replacement    She reports that she has seen two eye specialists at [facility] for double vision and cataracts    Chronic Pain  Patient reports that she had endoscopies and colonoscopy recently and is still on liquid diet  States that she was told she had diverticula  in her entire colon  Reports that she continues to have abdominal pain and difficulty with passing stools due to pain  She reports emesis due to sensitive gag reflex with brushing her teeth  Patient states she is constantly in pain and has to take the oxycodone to have any sense of functionality  States that her previous PCP would fill all of her medication  She states she has been to pain management in the past and did not find those recommended therapies helpful   She reports having rectal spasms  She sees Dr. Levada Dy in [place] for trigger point injections into her rectum, states these are painful but the only thing that helps her to have bowel movements and decrease chronic pelvic pain severity   Request for completion of  documents Patient request for completion of paperwork at end of visit  States that she is already late with providing the documentation to help with covering her medications cost    Medications: Outpatient Medications Prior to Visit  Medication Sig   fluticasone (FLONASE) 50 MCG/ACT nasal spray Place 2 sprays into both nostrils daily.   Green Tea, Camellia sinensis, (GREEN TEA PO) Take by mouth.   loratadine (CLARITIN) 10 MG tablet Take 1 tablet (10 mg total) by mouth daily.   NON FORMULARY Herbal Peppermint Tea   Polyethylene Glycol 3350 (MIRALAX PO) Take by mouth.   [DISCONTINUED] clonazePAM (KLONOPIN) 0.5 MG tablet Take 1 tablet (0.5 mg total) by mouth at bedtime as needed.   [DISCONTINUED] cyclobenzaprine (FLEXERIL) 5 MG tablet Take 1 tablet (5 mg total) by mouth 3 (three) times daily as needed for muscle spasms.   [DISCONTINUED] levothyroxine (SYNTHROID) 25 MCG tablet Take 1 tablet (25 mcg total) by mouth daily.   [DISCONTINUED] methylphenidate (RITALIN) 10 MG tablet TAKE ONE TABLET BY MOUTH TWICE A DAY **NEEDS APPOINTMENT WITH NEW PROVIDER**   [DISCONTINUED] omeprazole (PRILOSEC) 20 MG capsule Take 1 capsule (20 mg total) by mouth daily.   [DISCONTINUED] oxyCODONE (ROXICODONE) 15 MG immediate release tablet TAKE ONE TABLET BY MOUTH EVERY 8 HOURS RPN FOR PAIN   [DISCONTINUED] rosuvastatin (CRESTOR) 10 MG tablet Take 1 tablet (10 mg total) by mouth daily.   [DISCONTINUED] SUMAtriptan (IMITREX) 100 MG tablet Take 1 tablet at onset of migraine. May repeat in 2 hrs. Max 2/day and 2-3 days/week.   [DISCONTINUED] TRINTELLIX 20 MG TABS tablet TAKE ONE TABLET BY MOUTH EVERY DAY   No facility-administered medications prior to visit.    Review of Systems     Objective    BP (!) 138/91 (BP Location: Right Arm, Patient Position: Sitting, Cuff Size: Normal)   Pulse 77   Temp 99 F (37.2 C) (Oral)   Resp 16   Ht 5\' 2"  (1.575 m)   Wt 152 lb (68.9 kg)   SpO2 98%   BMI 27.80 kg/m     Physical Exam Constitutional:      Comments: Chronically ill appearing female, appears stated age  Reports having multiple episodes of chronic abdominal pain throughout the physical exam   Cardiovascular:     Rate and Rhythm: Normal rate and regular rhythm.  Pulmonary:     Effort: Pulmonary effort is normal. No respiratory distress.     Breath sounds: Normal breath sounds. No wheezing or rales.  Musculoskeletal:        General: Signs of injury present.     Comments: Patient is wearing knee brace   Psychiatric:        Mood and  Affect: Mood is anxious.        Speech: Speech is tangential.        Behavior: Behavior normal. Behavior is cooperative.        Thought Content: Thought content does not include homicidal or suicidal ideation.       No results found for any visits on 03/03/22.  Assessment & Plan     Problem List Items Addressed This Visit       Endocrine   Hypothyroidism (acquired)    Stable Symptoms well controlled  Continue current treatment plan  Refills provided for Synthroid 25mg  daily per patient request Will need thyroid levels checked at next CPE         Relevant Medications   levothyroxine (SYNTHROID) 25 MCG tablet     Musculoskeletal and Integument   Pelvic floor dysfunction - Primary    Patient reports constant pain related to this problem  Refills provided for oxycodone 15mg  every 8 hours for pain, 80 tabs to last for 30 days  Discussed that prescription refills for controlled substances will be provided on monthly basis  Patient will need UDS at next visit along with controlled substance contract  Recommended regular follow up given multiple controlled substances being taken  Has previously been to pain management  Narcan nasal spray 4mg  prescribed today         Other   Anxiety    Stable  Patient would benefit from psychiatry sessions given anxiety levels described today  Refills for klonopin provided, continue trintellix 20mg   PhQ 9  score of 10 today  She will need controlled substance contract for this at next visit       Relevant Medications   TRINTELLIX 20 MG TABS tablet   Hypercholesteremia    Chronic problem  Stable  Continue current medication at current doses rosuvastatin 10mg  daily   No medication changes today          Relevant Medications   rosuvastatin (CRESTOR) 10 MG tablet   Chronic pain syndrome    Refills provided for oxycodone 15mg  q 8 hours  Will need narcotic contract and UDS at next visit        Relevant Medications   TRINTELLIX 20 MG TABS tablet   oxyCODONE (ROXICODONE) 15 MG immediate release tablet   cyclobenzaprine (FLEXERIL) 5 MG tablet   clonazePAM (KLONOPIN) 0.5 MG tablet   Other Visit Diagnoses     MDD (major depressive disorder), recurrent episode, mild (HCC)       Relevant Medications   TRINTELLIX 20 MG TABS tablet   Current episode of major depressive disorder without prior episode       Relevant Medications   TRINTELLIX 20 MG TABS tablet   methylphenidate (RITALIN) 10 MG tablet   Gastroesophageal reflux disease without esophagitis       Relevant Medications   Polyethylene Glycol 3350 (MIRALAX PO)   omeprazole (PRILOSEC) 20 MG capsule        Return in about 3 weeks (around 03/24/2022) for meds, lab.       The entirety of the information documented in the History of Present Illness, Review of Systems and Physical Exam were personally obtained by me. Portions of this information were initially documented by Lyndel Pleasure, CMA and reviewed by me for thoroughness and accuracy.[name], MD    [name], MD  [facility] [phone] (phone) [phone] (fax)  [place]

## 2022-03-03 NOTE — Assessment & Plan Note (Signed)
Refills provided for oxycodone 15mg  q 8 hours  Will need narcotic contract and UDS at next visit

## 2022-03-03 NOTE — Assessment & Plan Note (Signed)
Chronic problem  Stable  Continue current medication at current doses rosuvastatin 10mg  daily   No medication changes today

## 2022-03-03 NOTE — Assessment & Plan Note (Signed)
Patient reports constant pain related to this problem  Refills provided for oxycodone 15mg  every 8 hours for pain, 80 tabs to last for 30 days  Discussed that prescription refills for controlled substances will be provided on monthly basis  Patient will need UDS at next visit along with controlled substance contract  Recommended regular follow up given multiple controlled substances being taken  Has previously been to pain management  Narcan nasal spray 4mg  prescribed today

## 2022-03-03 NOTE — Assessment & Plan Note (Signed)
Stable  Patient would benefit from psychiatry sessions given anxiety levels described today  Refills for klonopin provided, continue trintellix 20mg   PhQ 9 score of 10 today  She will need controlled substance contract for this at next visit

## 2022-03-04 ENCOUNTER — Other Ambulatory Visit: Payer: Self-pay | Admitting: Family Medicine

## 2022-03-04 DIAGNOSIS — F330 Major depressive disorder, recurrent, mild: Secondary | ICD-10-CM

## 2022-03-04 MED ORDER — TRINTELLIX 20 MG PO TABS: 20.0000 mg | ORAL_TABLET | Freq: Every day | ORAL | 2 refills | Status: DC

## 2022-03-05 ENCOUNTER — Other Ambulatory Visit: Payer: Self-pay | Admitting: Family Medicine

## 2022-03-05 DIAGNOSIS — K219 Gastro-esophageal reflux disease without esophagitis: Secondary | ICD-10-CM

## 2022-03-07 ENCOUNTER — Other Ambulatory Visit: Payer: Self-pay | Admitting: Family Medicine

## 2022-03-07 DIAGNOSIS — F329 Major depressive disorder, single episode, unspecified: Secondary | ICD-10-CM

## 2022-03-07 DIAGNOSIS — E7800 Pure hypercholesterolemia, unspecified: Secondary | ICD-10-CM

## 2022-03-07 DIAGNOSIS — G894 Chronic pain syndrome: Secondary | ICD-10-CM

## 2022-03-09 ENCOUNTER — Other Ambulatory Visit: Payer: Self-pay | Admitting: Family Medicine

## 2022-03-09 DIAGNOSIS — K219 Gastro-esophageal reflux disease without esophagitis: Secondary | ICD-10-CM

## 2022-03-09 DIAGNOSIS — E039 Hypothyroidism, unspecified: Secondary | ICD-10-CM

## 2022-03-09 DIAGNOSIS — E7800 Pure hypercholesterolemia, unspecified: Secondary | ICD-10-CM

## 2022-03-09 DIAGNOSIS — F329 Major depressive disorder, single episode, unspecified: Secondary | ICD-10-CM

## 2022-03-09 DIAGNOSIS — G894 Chronic pain syndrome: Secondary | ICD-10-CM

## 2022-03-09 MED ORDER — OXYCODONE HCL 15 MG PO TABS: ORAL_TABLET | ORAL | 0 refills | Status: DC

## 2022-03-09 MED ORDER — CLONAZEPAM 0.5 MG PO TABS: 0.5000 mg | ORAL_TABLET | Freq: Every evening | ORAL | 3 refills | Status: DC | PRN

## 2022-03-09 MED ORDER — SUMATRIPTAN SUCCINATE 100 MG PO TABS: ORAL_TABLET | ORAL | 0 refills | Status: AC

## 2022-03-09 MED ORDER — NALOXONE HCL 4 MG/0.1ML NA LIQD: NASAL | 3 refills | Status: AC

## 2022-03-09 MED ORDER — OMEPRAZOLE 20 MG PO CPDR: 20.0000 mg | DELAYED_RELEASE_CAPSULE | Freq: Every day | ORAL | 0 refills | Status: DC

## 2022-03-09 MED ORDER — CYCLOBENZAPRINE HCL 5 MG PO TABS: 5.0000 mg | ORAL_TABLET | Freq: Three times a day (TID) | ORAL | 5 refills | Status: AC | PRN

## 2022-03-09 MED ORDER — LEVOTHYROXINE SODIUM 25 MCG PO TABS: 25.0000 ug | ORAL_TABLET | Freq: Every day | ORAL | 0 refills | Status: DC

## 2022-03-09 MED ORDER — ROSUVASTATIN CALCIUM 10 MG PO TABS: 10.0000 mg | ORAL_TABLET | Freq: Every day | ORAL | 1 refills | Status: DC

## 2022-03-09 MED ORDER — METHYLPHENIDATE HCL 10 MG PO TABS: ORAL_TABLET | ORAL | 0 refills | Status: DC

## 2022-03-09 NOTE — Telephone Encounter (Signed)
Medications sent to updated pharmacy   [name], MD  [facility], [place]

## 2022-03-09 NOTE — Telephone Encounter (Signed)
All meds refilled on 1.25.24 were sent to the wrong pharmacy pt doesn't use [facility] / please re send only the meds below to [facility]     clonazePAM (KLONOPIN) 0.5 MG tablet [[id number]]  cyclobenzaprine (FLEXERIL) 5 MG tablet [[id number]]  levothyroxine (SYNTHROID) 25 MCG tablet [[id number]]  methylphenidate (RITALIN) 10 MG tablet [[id number]]  naloxone (NARCAN) nasal spray 4 mg/0.1 mL [[id number]]  omeprazole (PRILOSEC) 20 MG capsule [[id number]]  oxyCODONE (ROXICODONE) 15 MG immediate release tablet [[id number]]  rosuvastatin (CRESTOR) 10 MG tablet [[id number]]  SUMAtriptan (IMITREX) 100 MG tablet [[id number]]

## 2022-03-09 NOTE — Telephone Encounter (Signed)
Requested medication (s) are due for refill today: Yes  Requested medication (s) are on the active medication list:Yes  Last refill:  03/03/22 (sent to wrong pharmacy)  Future visit scheduled: Yes  Notes to clinic:  Unable to refill per protocol, cannot delegate, no updated lab results, resend to corrected pharmacy     Requested Prescriptions  Pending Prescriptions Disp Refills   clonazePAM (KLONOPIN) 0.5 MG tablet 30 tablet 3    Sig: Take 1 tablet (0.5 mg total) by mouth at bedtime as needed.     Not Delegated - Psychiatry: Anxiolytics/Hypnotics 2 Failed - 03/09/2022 10:41 AM      Failed - This refill cannot be delegated      Failed - Urine Drug Screen completed in last 360 days      Passed - Patient is not pregnant      Passed - Valid encounter within last 6 months    Recent Outpatient Visits           6 days ago Pelvic floor dysfunction   [place] [facility] [place], [place], MD   7 months ago Diffuse abdominal pain   [place] [facility] Bosie Clos, MD   8 months ago Diffuse abdominal pain   [place] [facility] Bosie Clos, MD   1 year ago Chronic pain of right knee   [place] [facility] Bosie Clos, MD   1 year ago Chronic pain syndrome   [place] [facility] Bosie Clos, MD       Future Appointments             In 1 week Simmons-Robinson, Tawanna Cooler, MD [facility]             cyclobenzaprine (FLEXERIL) 5 MG tablet 90 tablet 5    Sig: Take 1 tablet (5 mg total) by mouth 3 (three) times daily as needed for muscle spasms.     Not Delegated - Analgesics:  Muscle Relaxants Failed - 03/09/2022 10:41 AM      Failed - This refill cannot be delegated      Passed - Valid encounter within last 6 months    Recent Outpatient Visits           6 days ago Pelvic floor dysfunction   [place] [facility] [place], [place], MD   7 months ago Diffuse abdominal pain   [place] [facility] Bosie Clos, MD   8 months ago Diffuse abdominal pain   [place] [facility] Bosie Clos, MD   1 year ago Chronic pain of right knee   [facility] Bosie Clos, MD   1 year ago Chronic pain syndrome   [place] [facility] Bosie Clos, MD       Future Appointments             In 1 week Simmons-Robinson, Tawanna Cooler, MD [facility]             levothyroxine (SYNTHROID) 25 MCG tablet 90 tablet 0    Sig: Take 1 tablet (25 mcg total) by mouth daily.     Endocrinology:  Hypothyroid Agents Failed - 03/09/2022 10:41 AM      Failed - TSH in normal range and within 360 days    TSH  Date Value Ref Range Status  02/05/2021 3.140 0.450 - 4.500  uIU/mL Final         Passed - Valid encounter within last 12 months    Recent Outpatient Visits           6 days ago Pelvic floor dysfunction   [place], [place], MD   7 months ago Diffuse abdominal pain   [place] Eulas Post, MD   8 months ago Diffuse abdominal pain   [place] Eulas Post, MD   1 year ago Chronic pain of right knee   [place] Eulas Post, MD   1 year ago Chronic pain syndrome   [place] Eulas Post, MD       Future Appointments             In 1 week Simmons-Robinson, Riki Sheer, MD [facility]             methylphenidate (RITALIN) 10 MG tablet 60 tablet 0    Sig: TAKE ONE TABLET BY MOUTH TWICE A DAY **NEEDS APPOINTMENT WITH NEW PROVIDER**     Not Delegated - Psychiatry:  Stimulants/ADHD Failed - 03/09/2022 10:41 AM      Failed - This refill cannot be  delegated      Failed - Urine Drug Screen completed in last 360 days      Failed - Last BP in normal range    BP Readings from Last 1 Encounters:  03/03/22 (!) 138/91         Passed - Last Heart Rate in normal range    Pulse Readings from Last 1 Encounters:  03/03/22 77         Passed - Valid encounter within last 6 months    Recent Outpatient Visits           6 days ago Pelvic floor dysfunction   [place], [place], MD   7 months ago Diffuse abdominal pain   [place] Eulas Post, MD   8 months ago Diffuse abdominal pain   [place] Eulas Post, MD   1 year ago Chronic pain of right knee   [place] Eulas Post, MD   1 year ago Chronic pain syndrome   [place] Eulas Post, MD       Future Appointments             In 1 week Simmons-Robinson, Riki Sheer, MD [facility]             naloxone [facility]) nasal spray 4 mg/0.1 mL 1 each 3    Sig: 1 spray (4 mg) intranasally into 1 nostril. May repeat every 2 to 3 minutes in the event of narcotic overdose     Off-Protocol Failed - 03/09/2022 10:41 AM      Failed - Medication not assigned to a protocol, review manually.      Passed - Valid encounter within last 12 months    Recent Outpatient Visits           6 days ago Pelvic floor dysfunction   [place], [place], MD   7 months ago Diffuse abdominal pain   [place] Eulas Post, MD   8 months ago Diffuse abdominal pain   [facility] Eulas Post, MD   1 year ago Chronic pain of right knee   [place] Eulas Post, MD   1 year ago Chronic pain syndrome   [place] Eulas Post, MD       Future Appointments             In 1 week Simmons-Robinson, Riki Sheer, MD [facility], [facility]             omeprazole (PRILOSEC) 20 MG capsule 90 capsule 0    Sig: Take 1 capsule (20 mg total) by mouth daily.     Gastroenterology: Proton Pump Inhibitors Passed - 03/09/2022 10:41 AM      Passed - Valid encounter within last 12 months    Recent Outpatient Visits           6 days ago Pelvic floor dysfunction   [place], [place], MD   7 months ago Diffuse abdominal pain   [place] Eulas Post, MD   8 months ago Diffuse abdominal pain   [place] Eulas Post, MD   1 year ago Chronic pain of right knee   [place] Eulas Post, MD   1 year ago Chronic pain syndrome   [place] Eulas Post, MD       Future Appointments             In 1 week Simmons-Robinson, Riki Sheer, MD [facility]             oxyCODONE (ROXICODONE) 15 MG immediate release tablet 80 tablet 0    Sig: TAKE ONE TABLET BY MOUTH EVERY 8 HOURS RPN FOR PAIN     Not Delegated - Analgesics:  Opioid Agonists Failed - 03/09/2022 10:41 AM      Failed - This refill cannot be delegated      Failed - Urine Drug Screen completed in last 360 days      Passed - Valid encounter within last 3 months    Recent Outpatient Visits           6 days ago Pelvic floor dysfunction   [place], [place], MD   7 months ago Diffuse abdominal pain   [place] Eulas Post, MD   8 months ago Diffuse abdominal pain   [place] Eulas Post, MD   1 year ago Chronic pain of right knee   [place] Eulas Post, MD   1 year  ago Chronic pain syndrome   [place] Eulas Post, MD       Future Appointments             In 1 week Simmons-Robinson, Riki Sheer, MD [facility]             rosuvastatin (CRESTOR) 10 MG tablet 90 tablet 1    Sig: Take 1 tablet (10 mg total) by mouth daily.     Cardiovascular:  Antilipid - Statins 2 Failed - 03/09/2022 10:41 AM      Failed - Cr in normal range and within 360 days    Creatinine  Date Value Ref Range Status  10/31/2013 1.39 (H) 0.60 - 1.30 mg/dL Final  Creatinine, Ser  Date Value Ref Range Status  02/05/2021 1.13 (H) 0.57 - 1.00 mg/dL Final         Failed - Lipid Panel in normal range within the last 12 months    Cholesterol, Total  Date Value Ref Range Status  07/22/2021 120 100 - 199 mg/dL Final   LDL Chol Calc (NIH)  Date Value Ref Range Status  07/22/2021 53 0 - 99 mg/dL Final   HDL  Date Value Ref Range Status  07/22/2021 52 >39 mg/dL Final   Triglycerides  Date Value Ref Range Status  07/22/2021 75 0 - 149 mg/dL Final         Passed - Patient is not pregnant      Passed - Valid encounter within last 12 months    Recent Outpatient Visits           6 days ago Pelvic floor dysfunction   [place], Annetta, MD   7 months ago Diffuse abdominal pain   [place] Eulas Post, MD   8 months ago Diffuse abdominal pain   [place] Eulas Post, MD   1 year ago Chronic pain of right knee   [place] Eulas Post, MD   1 year ago Chronic pain syndrome   [place] Eulas Post, MD       Future Appointments             In 1 week Simmons-Robinson, Riki Sheer, MD [facility]             SUMAtriptan (IMITREX) 100 MG tablet 10 tablet 0    Sig: Take 1 tablet at  onset of migraine. May repeat in 2 hrs. Max 2/day and 2-3 days/week.     Neurology:  Migraine Therapy - Triptan Failed - 03/09/2022 10:41 AM      Failed - Last BP in normal range    BP Readings from Last 1 Encounters:  03/03/22 (!) 138/91         Passed - Valid encounter within last 12 months    Recent Outpatient Visits           6 days ago Pelvic floor dysfunction   [place], [place], MD   7 months ago Diffuse abdominal pain   [place] Eulas Post, MD   8 months ago Diffuse abdominal pain   [place] Eulas Post, MD   1 year ago Chronic pain of right knee   [place] Eulas Post, MD   1 year ago Chronic pain syndrome   [place] Eulas Post, MD       Future Appointments             In 1 week Simmons-Robinson, Riki Sheer, MD [facility], [place]

## 2022-03-15 NOTE — Progress Notes (Unsigned)
I,[name],acting as a scribe for [employer], MD.,have documented all relevant documentation on the behalf of Hannah Foster, MD,as directed by  Hannah Foster, MD while in the presence of Hannah Foster, MD.   Established patient visit   Patient: Hannah Vance   DOB: Jun 17, 1955   67 y.o. Female  MRN: [id number] Visit Date: 03/16/2022  Today's healthcare provider: [name], MD   Chief Complaint  Patient presents with   Follow-up   Subjective    HPI Patient here for 3 weeks follow up meds and labs.  Controlled Medication management  Patient states that she is adherent to regimen including klonopin, oxycodone, and ritalin   She states that klonopin keeps her from having panic attacks and she has to take one nightly  she stats that it helps her to have adequate rest at night  She states that she does not leave her home as often to prevent the panic attacks   She also tries to stay out of public places dues to diarrhea   The oxycodone helps with her rectal pain and painful muscle spasms in her back  Helps with sciatica pain as well  She has prescription for Narcan in the event there is concern for overdose  Patient reports taking the medication as prescribed and does not take additional doses She states that she tries to limit the pills she takes until she needs them to function   Ritalin: states  she has been on this medication for several years, 10-15 years, reports that it was initially prescribed by Dr. Geryl Councilman, psychiatrist  She states that her focus has been much improved while taking the ritalin  She states that in the past she has been told by family and friends that she would be seen staring at objects without realizing she was staring  She states that it helps her mind to focus and therefore helps with nighttime thinking and contributes to more restful sleep   She does not see anyone for psychiatry now  due to neck pain, bowel problems, migraines and pelvic muscle dysfunction  She states she would like to "get everything together" before she is referred to psychiatry  She states she is not driving right now and prefers to hold off on a additional referrals    Vitamin B12 Deficiency  Patient initialy reequests for B12 injection  She states that she received theese in the past  She has no updated b12 leve sin 2018 in the chart  Recommended b12 level collection today to determin if she needs b12 injection   Medications: Outpatient Medications Prior to Visit  Medication Sig Note   clonazePAM (KLONOPIN) 0.5 MG tablet Take 1 tablet (0.5 mg total) by mouth at bedtime as needed.    cyclobenzaprine (FLEXERIL) 5 MG tablet Take 1 tablet (5 mg total) by mouth 3 (three) times daily as needed for muscle spasms.    fluticasone (FLONASE) 50 MCG/ACT nasal spray Place 2 sprays into both nostrils daily.    Green Tea, Camellia sinensis, (GREEN TEA PO) Take by mouth.    levothyroxine (SYNTHROID) 25 MCG tablet Take 1 tablet (25 mcg total) by mouth daily.    loratadine (CLARITIN) 10 MG tablet Take 1 tablet (10 mg total) by mouth daily.    methylphenidate (RITALIN) 10 MG tablet TAKE ONE TABLET BY MOUTH TWICE A DAY **NEEDS APPOINTMENT WITH NEW PROVIDER**    naloxone (NARCAN) nasal spray 4 mg/0.1 mL 1 spray (4 mg) intranasally into 1 nostril.  May repeat every 2 to 3 minutes in the event of narcotic overdose    NON FORMULARY Herbal Peppermint Tea    omeprazole (PRILOSEC) 20 MG capsule Take 1 capsule (20 mg total) by mouth daily.    oxyCODONE (ROXICODONE) 15 MG immediate release tablet TAKE ONE TABLET BY MOUTH EVERY 8 HOURS RPN FOR PAIN    Polyethylene Glycol 3350 (MIRALAX PO) Take by mouth.    rosuvastatin (CRESTOR) 10 MG tablet Take 1 tablet (10 mg total) by mouth daily.    SUMAtriptan (IMITREX) 100 MG tablet Take 1 tablet at onset of migraine. May repeat in 2 hrs. Max 2/day and 2-3 days/week.    [DISCONTINUED]  TRINTELLIX 20 MG TABS tablet Take 1 tablet (20 mg total) by mouth daily. 03/16/2022: printed RX for medication assistance application   No facility-administered medications prior to visit.    Review of Systems     Objective    BP 118/78 (BP Location: Right Arm, Patient Position: Sitting, Cuff Size: Normal)   Pulse 96   Temp 98.7 F (37.1 C) (Oral)   Resp 16   Wt 151 lb 4.8 oz (68.6 kg)   SpO2 97%   BMI 27.67 kg/m    Physical Exam Vitals reviewed.  Constitutional:      General: She is not in acute distress.    Appearance: Normal appearance. She is not ill-appearing, toxic-appearing or diaphoretic.     Comments: Pleasant female appearing stated age in LLE brace seated in exam room, frequently moving 2/2 to pain with sitting  Eyes:     Conjunctiva/sclera: Conjunctivae normal.  Cardiovascular:     Rate and Rhythm: Normal rate and regular rhythm.     Pulses: Normal pulses.     Heart sounds: Normal heart sounds. No murmur heard.    No friction rub. No gallop.  Pulmonary:     Effort: Pulmonary effort is normal. No respiratory distress.     Breath sounds: Normal breath sounds. No stridor. No wheezing, rhonchi or rales.  Abdominal:     General: Bowel sounds are normal. There is no distension.     Palpations: Abdomen is soft.     Tenderness: There is no abdominal tenderness.  Musculoskeletal:     Right lower leg: No edema.     Left lower leg: No edema.     Comments: No tenderness with palpation of vertebral column  No skin changes visualized  No kyphosis nor scoliosis noted on exam  Range of motion is limited 2/2 to pain   Patient is wearing hinge knee brace on left LE   Sits leaning to the side due to rectal pain   Skin:    Findings: No erythema or rash.  Neurological:     Mental Status: She is alert and oriented to person, place, and time.  Psychiatric:        Attention and Perception: Attention normal.        Mood and Affect: Mood and affect normal. Mood is not  anxious or depressed.        Behavior: Behavior is hyperactive. Behavior is not slowed, aggressive or withdrawn. Behavior is cooperative.     Comments: Speech is tangential but easily redirected Patient moves frequently throughout exam due to pain with sitting on rectum        No results found for any visits on 03/16/22.  Assessment & Plan     Problem List Items Addressed This Visit       Other   Chronic  pelvic pain in female    Chronic  Stable  Symptoms improved on oxycodone 15mg  every 8 hours PRN  Reviewed terms of opioid agreement with patient  Patient unable to sign today due to attempt to get to lab prior to AM closing time  Recommend obtaining signature at next visit  UDS ordered today  Continue current regimen  Refills provided at previous visit Follow up in 3 months        Relevant Medications   vortioxetine HBr (TRINTELLIX) 20 MG TABS tablet   Other Relevant Orders   Pain Management Screening Profile (10S)   MDD (major depressive disorder), recurrent episode, mild ([place])    Chronic  Stable  Recommended referral to psychiatry given hx of trintellix prescription, patient declines at this time  Refill provided along with paper work requesting mediation cost assistance completed provider's portion and patient will work on her sections prior to submission  Patient to follow up in 3 months  Continue trintellix 20mg  daily       Relevant Medications   vortioxetine HBr (TRINTELLIX) 20 MG TABS tablet   B12 deficiency - Primary    Chronic  Stable  Last level checked in 2018  Will order B12 level today to see if patient needs additional IM supplementation  Follow up pending results       Relevant Orders   Vitamin B12   Chronic use of benzodiazepine for therapeutic purpose    Chronic  Symptoms controlled on klonopin 0.5mg  qHS PRN  Continue current regimen  Refills provided at previous visit  UDS ordered today  Patient will need signature for chronic non-opioid  agreement at next visit  Follow up in 3 months       Relevant Orders   Pain Management Screening Profile (10S)   Chronic, continuous use of opioids   Relevant Orders   Pain Management Screening Profile (10S)     Return in about 3 months (around 06/14/2022) for med management .       The entirety of the information documented in the History of Present Illness, Review of Systems and Physical Exam were personally obtained by me. Portions of this information were initially documented by Lyndel Pleasure, CMA and reviewed by me for thoroughness and accuracy.Hannah Foster, MD     Hannah Foster, MD  [facility] [phone] (phone) [phone] (fax)  [place]

## 2022-03-16 ENCOUNTER — Ambulatory Visit (INDEPENDENT_AMBULATORY_CARE_PROVIDER_SITE_OTHER): Payer: Medicare HMO | Admitting: Family Medicine

## 2022-03-16 ENCOUNTER — Encounter: Payer: Self-pay | Admitting: Family Medicine

## 2022-03-16 VITALS — BP 118/78 | HR 96 | Temp 98.7°F | Resp 16 | Wt 151.3 lb

## 2022-03-16 DIAGNOSIS — Z79899 Other long term (current) drug therapy: Secondary | ICD-10-CM | POA: Diagnosis not present

## 2022-03-16 DIAGNOSIS — F330 Major depressive disorder, recurrent, mild: Secondary | ICD-10-CM

## 2022-03-16 DIAGNOSIS — E538 Deficiency of other specified B group vitamins: Secondary | ICD-10-CM | POA: Diagnosis not present

## 2022-03-16 DIAGNOSIS — G8929 Other chronic pain: Secondary | ICD-10-CM

## 2022-03-16 DIAGNOSIS — R69 Illness, unspecified: Secondary | ICD-10-CM | POA: Diagnosis not present

## 2022-03-16 DIAGNOSIS — F1190 Opioid use, unspecified, uncomplicated: Secondary | ICD-10-CM | POA: Diagnosis not present

## 2022-03-16 DIAGNOSIS — R102 Pelvic and perineal pain: Secondary | ICD-10-CM

## 2022-03-16 MED ORDER — VORTIOXETINE HBR 20 MG PO TABS: 20.0000 mg | ORAL_TABLET | Freq: Every day | ORAL | 3 refills | Status: DC

## 2022-03-16 NOTE — Assessment & Plan Note (Signed)
Chronic  Symptoms controlled on klonopin 0.5mg  qHS PRN  Continue current regimen  Refills provided at previous visit  UDS ordered today  Patient will need signature for chronic non-opioid agreement at next visit  Follow up in 3 months

## 2022-03-16 NOTE — Assessment & Plan Note (Addendum)
Chronic  Stable  Symptoms improved on oxycodone 15mg  every 8 hours PRN  Reviewed terms of opioid agreement with patient  Patient unable to sign today due to attempt to get to lab prior to AM closing time  Recommend obtaining signature at next visit  UDS ordered today  Continue current regimen  Refills provided at previous visit Follow up in 3 months

## 2022-03-16 NOTE — Assessment & Plan Note (Signed)
Chronic  Stable  Last level checked in 2018  Will order B12 level today to see if patient needs additional IM supplementation  Follow up pending results

## 2022-03-16 NOTE — Assessment & Plan Note (Signed)
Chronic  Stable  Recommended referral to psychiatry given hx of trintellix prescription, patient declines at this time  Refill provided along with paper work requesting mediation cost assistance completed provider's portion and patient will work on her sections prior to submission  Patient to follow up in 3 months  Continue trintellix 20mg  daily

## 2022-03-23 ENCOUNTER — Ambulatory Visit: Payer: Self-pay

## 2022-03-23 ENCOUNTER — Telehealth: Payer: Self-pay

## 2022-03-23 NOTE — Telephone Encounter (Signed)
Patient scheduled for tomorrow

## 2022-03-23 NOTE — Progress Notes (Deleted)
      Established patient visit   Patient: Hannah Vance   DOB: 1956/01/12   67 y.o. Female  MRN: [id number] Visit Date: 03/24/2022  Today's healthcare provider: [name], MD   No chief complaint on file.  Subjective    Urinary Frequency  Associated symptoms include frequency.    Back Pain  Concern for Kidney infection   Patient is reporting increased back pain from baseline that she is concerned may be related to her cholesterol medication  She has hx of CKD, last creatinine was 1.13 in 2022  Patient has been taking rosuvastatin since 2023    ---------------------------------------------------------------------------------------   Medications: Outpatient Medications Prior to Visit  Medication Sig   clonazePAM (KLONOPIN) 0.5 MG tablet Take 1 tablet (0.5 mg total) by mouth at bedtime as needed.   cyclobenzaprine (FLEXERIL) 5 MG tablet Take 1 tablet (5 mg total) by mouth 3 (three) times daily as needed for muscle spasms.   fluticasone (FLONASE) 50 MCG/ACT nasal spray Place 2 sprays into both nostrils daily.   Green Tea, Camellia sinensis, (GREEN TEA PO) Take by mouth.   levothyroxine (SYNTHROID) 25 MCG tablet Take 1 tablet (25 mcg total) by mouth daily.   loratadine (CLARITIN) 10 MG tablet Take 1 tablet (10 mg total) by mouth daily.   methylphenidate (RITALIN) 10 MG tablet TAKE ONE TABLET BY MOUTH TWICE A DAY **NEEDS APPOINTMENT WITH NEW PROVIDER**   naloxone (NARCAN) nasal spray 4 mg/0.1 mL 1 spray (4 mg) intranasally into 1 nostril. May repeat every 2 to 3 minutes in the event of narcotic overdose   NON FORMULARY Herbal Peppermint Tea   omeprazole (PRILOSEC) 20 MG capsule Take 1 capsule (20 mg total) by mouth daily.   oxyCODONE (ROXICODONE) 15 MG immediate release tablet TAKE ONE TABLET BY MOUTH EVERY 8 HOURS RPN FOR PAIN   Polyethylene Glycol 3350 (MIRALAX PO) Take by mouth.   rosuvastatin (CRESTOR) 10 MG tablet Take 1 tablet (10 mg total) by mouth daily.    SUMAtriptan (IMITREX) 100 MG tablet Take 1 tablet at onset of migraine. May repeat in 2 hrs. Max 2/day and 2-3 days/week.   vortioxetine HBr (TRINTELLIX) 20 MG TABS tablet Take 1 tablet (20 mg total) by mouth daily.   No facility-administered medications prior to visit.    Review of Systems  Genitourinary:  Positive for frequency.   {Labs  Heme  Chem  Endocrine  Serology  Results Review (optional):23779}   Objective    There were no vitals taken for this visit. {Show previous vital signs (optional):23777}  Physical Exam  ***  No results found for any visits on 03/24/22.  Assessment & Plan     Problem List Items Addressed This Visit   None    No follow-ups on file.        The entirety of the information documented in the History of Present Illness, Review of Systems and Physical Exam were personally obtained by me. Portions of this information were initially documented by *** and reviewed by me for thoroughness and accuracy.[name], MD     [name], MD  [facility] [phone] (phone) [phone] (fax)  [place]

## 2022-03-23 NOTE — Telephone Encounter (Signed)
If Able, please schedule patient for early appointment tomorrow 03/24/22 to evaluate    Hannah Foster, MD [facility] PGY-3

## 2022-03-23 NOTE — Telephone Encounter (Signed)
Left message to call back about symptoms. Uable to reach pt.

## 2022-03-23 NOTE — Telephone Encounter (Signed)
Copied from [place]. Topic: General - Other >> Mar 23, 2022  9:34 AM Sabas Sous wrote: Reason for CRM: Pt called and is requesting to have lab orders to check her kidneys for infection. She says she has to go to the bathroom a lot more than usual. Has not been sleeping well, has chronic pain. Everyday seems like her back pain is getting better

## 2022-03-23 NOTE — Telephone Encounter (Signed)
  Summary: Back pain, possible medication reaction   Pt believes her rosuvastatin is causing her to have lower back pain. She has chronic kidney disease and she believes taking rosuvastatin has made her back pain worse  Best contact: [phone]           Left message to call back about symptoms.

## 2022-03-24 ENCOUNTER — Ambulatory Visit: Payer: Medicare HMO | Admitting: Family Medicine

## 2022-03-24 ENCOUNTER — Ambulatory Visit (INDEPENDENT_AMBULATORY_CARE_PROVIDER_SITE_OTHER): Payer: Medicare HMO | Admitting: Family Medicine

## 2022-03-24 ENCOUNTER — Encounter: Payer: Self-pay | Admitting: Family Medicine

## 2022-03-24 VITALS — BP 118/80 | HR 76 | Temp 98.1°F | Resp 16 | Wt 154.7 lb

## 2022-03-24 DIAGNOSIS — F1190 Opioid use, unspecified, uncomplicated: Secondary | ICD-10-CM | POA: Diagnosis not present

## 2022-03-24 DIAGNOSIS — R350 Frequency of micturition: Secondary | ICD-10-CM

## 2022-03-24 LAB — POCT URINALYSIS DIPSTICK
Bilirubin, UA: NEGATIVE
Glucose, UA: NEGATIVE
Ketones, UA: NEGATIVE
Leukocytes, UA: NEGATIVE
Nitrite, UA: NEGATIVE
Protein, UA: NEGATIVE
Spec Grav, UA: 1.01 (ref 1.010–1.025)
Urobilinogen, UA: 0.2 E.U./dL
pH, UA: 6 (ref 5.0–8.0)

## 2022-03-24 NOTE — Assessment & Plan Note (Addendum)
Acute onset  In setting of chronic back pain  Will collect CMP to assess creatinine, urine culture and urine microscopy to evaluate small amount of blood on U/A  U/A was negative for nitrites  Recommended she return to care for worsening symptoms

## 2022-03-24 NOTE — Progress Notes (Signed)
I,[name],acting as a scribe for [employer], MD.,have documented all relevant documentation on the behalf of [name], MD,as directed by  [name], MD while in the presence of [name], MD.   Established patient visit   Patient: Hannah Vance   DOB: November 15, 1955   67 y.o. Female  MRN: [id number] Visit Date: 03/24/2022  Today's healthcare provider: [name], MD   Chief Complaint  Patient presents with   Urinary Frequency   Subjective    Urinary Frequency  This is a new problem. Episode onset: within a week. The problem has been unchanged. There has been no fever. Associated symptoms include flank pain, frequency and urgency. Pertinent negatives include no discharge or hematuria. Associated symptoms comments: Low back pain. She has tried increased fluids ([place]) for the symptoms.    Patient reports prior to onset of urinary urgency and frequency she had urological procedure.she has not had fevers.   Medications: Outpatient Medications Prior to Visit  Medication Sig   clonazePAM (KLONOPIN) 0.5 MG tablet Take 1 tablet (0.5 mg total) by mouth at bedtime as needed.   cyclobenzaprine (FLEXERIL) 5 MG tablet Take 1 tablet (5 mg total) by mouth 3 (three) times daily as needed for muscle spasms.   fluticasone (FLONASE) 50 MCG/ACT nasal spray Place 2 sprays into both nostrils daily.   Green Tea, Camellia sinensis, (GREEN TEA PO) Take by mouth.   levothyroxine (SYNTHROID) 25 MCG tablet Take 1 tablet (25 mcg total) by mouth daily.   loratadine (CLARITIN) 10 MG tablet Take 1 tablet (10 mg total) by mouth daily.   methylphenidate (RITALIN) 10 MG tablet TAKE ONE TABLET BY MOUTH TWICE A DAY **NEEDS APPOINTMENT WITH NEW PROVIDER**   naloxone (NARCAN) nasal spray 4 mg/0.1 mL 1 spray (4 mg) intranasally into 1 nostril. May repeat every 2 to 3 minutes in the event of narcotic overdose   NON FORMULARY Herbal  Peppermint Tea   omeprazole (PRILOSEC) 20 MG capsule Take 1 capsule (20 mg total) by mouth daily.   oxyCODONE (ROXICODONE) 15 MG immediate release tablet TAKE ONE TABLET BY MOUTH EVERY 8 HOURS RPN FOR PAIN   Polyethylene Glycol 3350 (MIRALAX PO) Take by mouth.   rosuvastatin (CRESTOR) 10 MG tablet Take 1 tablet (10 mg total) by mouth daily.   SUMAtriptan (IMITREX) 100 MG tablet Take 1 tablet at onset of migraine. May repeat in 2 hrs. Max 2/day and 2-3 days/week.   vortioxetine HBr (TRINTELLIX) 20 MG TABS tablet Take 1 tablet (20 mg total) by mouth daily.   No facility-administered medications prior to visit.    Review of Systems  Genitourinary:  Positive for flank pain, frequency and urgency. Negative for hematuria.       Objective    BP 118/80 (BP Location: Right Arm, Patient Position: Sitting, Cuff Size: Normal)   Pulse 76   Temp 98.1 F (36.7 C) (Oral)   Resp 16   Wt 154 lb 11.2 oz (70.2 kg)   BMI 28.30 kg/m    Physical Exam Abdominal:     General: Bowel sounds are normal. There is no distension.     Palpations: Abdomen is soft.     Tenderness: There is abdominal tenderness in the suprapubic area. There is no right CVA tenderness or left CVA tenderness.     Comments: Low back pain tenderness bilaterally Patient stands throughout visit to help with relieving back pain         Results for orders placed or  performed in visit on 03/24/22  POCT urinalysis dipstick  Result Value Ref Range   Color, UA Yellow    Clarity, UA Clear    Glucose, UA Negative Negative   Bilirubin, UA Negative    Ketones, UA Negative    Spec Grav, UA 1.010 1.010 - 1.025   Blood, UA Small    pH, UA 6.0 5.0 - 8.0   Protein, UA Negative Negative   Urobilinogen, UA 0.2 0.2 or 1.0 E.U./dL   Nitrite, UA Negative    Leukocytes, UA Negative Negative   Appearance     Odor      Assessment & Plan     Problem List Items Addressed This Visit       Other   Frequent urination - Primary    Acute  onset  In setting of chronic back pain  Will collect CMP to assess creatinine, urine culture and urine microscopy to evaluate small amount of blood on U/A  U/A was negative for nitrites  Recommended she return to care for worsening symptoms       Relevant Orders   POCT urinalysis dipstick (Completed)   Urine Microscopic   Urine Culture   Basic Metabolic Panel (BMET)     Return if symptoms worsen or fail to improve.       The entirety of the information documented in the History of Present Illness, Review of Systems and Physical Exam were personally obtained by me. Portions of this information were initially documented by Lyndel Pleasure, CMA and reviewed by me for thoroughness and accuracy.[name], MD     [name], MD  [facility] [phone] (phone) [phone] (fax)  [place]

## 2022-03-24 NOTE — Patient Instructions (Signed)
We will check your kidney function today and follow up with your regarding the results are available.

## 2022-03-25 DIAGNOSIS — G894 Chronic pain syndrome: Secondary | ICD-10-CM | POA: Diagnosis not present

## 2022-03-25 DIAGNOSIS — M62838 Other muscle spasm: Secondary | ICD-10-CM | POA: Diagnosis not present

## 2022-03-25 DIAGNOSIS — R102 Pelvic and perineal pain: Secondary | ICD-10-CM | POA: Diagnosis not present

## 2022-03-25 LAB — PMP SCREEN PROFILE (10S), URINE
Amphetamine Scrn, Ur: NEGATIVE ng/mL
BARBITURATE SCREEN URINE: NEGATIVE ng/mL
BENZODIAZEPINE SCREEN, URINE: NEGATIVE ng/mL
CANNABINOIDS UR QL SCN: NEGATIVE ng/mL
Cocaine (Metab) Scrn, Ur: NEGATIVE ng/mL
Creatinine(Crt), U: 61.3 mg/dL (ref 20.0–300.0)
Methadone Screen, Urine: NEGATIVE ng/mL
OXYCODONE+OXYMORPHONE UR QL SCN: POSITIVE ng/mL — AB
Opiate Scrn, Ur: POSITIVE ng/mL — AB
Ph of Urine: 5.6 (ref 4.5–8.9)
Phencyclidine Qn, Ur: NEGATIVE ng/mL
Propoxyphene Scrn, Ur: NEGATIVE ng/mL

## 2022-03-25 LAB — BASIC METABOLIC PANEL
BUN/Creatinine Ratio: 7 — ABNORMAL LOW (ref 12–28)
BUN: 7 mg/dL — ABNORMAL LOW (ref 8–27)
CO2: 24 mmol/L (ref 20–29)
Calcium: 9.2 mg/dL (ref 8.7–10.3)
Chloride: 100 mmol/L (ref 96–106)
Creatinine, Ser: 0.98 mg/dL (ref 0.57–1.00)
Glucose: 94 mg/dL (ref 70–99)
Potassium: 4.1 mmol/L (ref 3.5–5.2)
Sodium: 138 mmol/L (ref 134–144)
eGFR: 64 mL/min/{1.73_m2} (ref >59)

## 2022-03-25 LAB — URINALYSIS, MICROSCOPIC ONLY
Bacteria, UA: NONE SEEN
Casts: NONE SEEN /lpf
RBC, Urine: NONE SEEN /hpf (ref 0–2)

## 2022-03-25 LAB — MED LIST OPTION NOT SELECTED

## 2022-03-25 LAB — VITAMIN B12: Vitamin B-12: 369 pg/mL (ref 232–1245)

## 2022-03-28 LAB — URINE CULTURE: Organism ID, Bacteria: NO GROWTH

## 2022-04-21 ENCOUNTER — Other Ambulatory Visit: Payer: Self-pay | Admitting: Family Medicine

## 2022-04-21 DIAGNOSIS — F329 Major depressive disorder, single episode, unspecified: Secondary | ICD-10-CM

## 2022-04-21 NOTE — Telephone Encounter (Signed)
Pt is calling to follow up on medication refill.  Please advise.  

## 2022-04-29 ENCOUNTER — Other Ambulatory Visit: Payer: Self-pay | Admitting: Family Medicine

## 2022-04-29 DIAGNOSIS — G894 Chronic pain syndrome: Secondary | ICD-10-CM

## 2022-06-06 ENCOUNTER — Other Ambulatory Visit: Payer: Self-pay | Admitting: Family Medicine

## 2022-06-06 DIAGNOSIS — F329 Major depressive disorder, single episode, unspecified: Secondary | ICD-10-CM

## 2022-06-06 DIAGNOSIS — K219 Gastro-esophageal reflux disease without esophagitis: Secondary | ICD-10-CM

## 2022-06-06 DIAGNOSIS — G43109 Migraine with aura, not intractable, without status migrainosus: Secondary | ICD-10-CM | POA: Diagnosis not present

## 2022-06-06 DIAGNOSIS — M792 Neuralgia and neuritis, unspecified: Secondary | ICD-10-CM | POA: Diagnosis not present

## 2022-06-06 MED ORDER — OMEPRAZOLE 20 MG PO CPDR: 20.0000 mg | DELAYED_RELEASE_CAPSULE | Freq: Every day | ORAL | 0 refills | Status: DC

## 2022-06-06 MED ORDER — METHYLPHENIDATE HCL 10 MG PO TABS: ORAL_TABLET | ORAL | 0 refills | Status: DC

## 2022-06-06 NOTE — Telephone Encounter (Signed)
[facility] faxed refill request for the following medications:    methylphenidate (RITALIN) 10 MG tablet    omeprazole (PRILOSEC) 20 MG capsule    Please advise

## 2022-06-08 DIAGNOSIS — H02825 Cysts of left lower eyelid: Secondary | ICD-10-CM | POA: Diagnosis not present

## 2022-06-08 DIAGNOSIS — H25813 Combined forms of age-related cataract, bilateral: Secondary | ICD-10-CM | POA: Diagnosis not present

## 2022-06-15 ENCOUNTER — Ambulatory Visit: Payer: Medicare HMO | Admitting: Family Medicine

## 2022-06-16 ENCOUNTER — Telehealth: Payer: Self-pay | Admitting: Family Medicine

## 2022-06-16 DIAGNOSIS — G894 Chronic pain syndrome: Secondary | ICD-10-CM

## 2022-06-16 MED ORDER — OXYCODONE HCL 15 MG PO TABS
ORAL_TABLET | ORAL | 0 refills | Status: DC
Start: 2022-06-16 — End: 2022-08-03

## 2022-06-16 NOTE — Telephone Encounter (Signed)
Refill provided   [name], MD  [facility]

## 2022-06-16 NOTE — Telephone Encounter (Signed)
[facility] pharmacy faxed refill request for the following medications:   oxyCODONE (ROXICODONE) 15 MG immediate release tablet     Please advise

## 2022-07-13 ENCOUNTER — Ambulatory Visit: Payer: Medicare HMO | Admitting: Family Medicine

## 2022-07-21 DIAGNOSIS — R519 Headache, unspecified: Secondary | ICD-10-CM | POA: Diagnosis not present

## 2022-07-21 DIAGNOSIS — N134 Hydroureter: Secondary | ICD-10-CM | POA: Diagnosis not present

## 2022-07-21 DIAGNOSIS — R1084 Generalized abdominal pain: Secondary | ICD-10-CM | POA: Diagnosis not present

## 2022-07-21 DIAGNOSIS — N201 Calculus of ureter: Secondary | ICD-10-CM | POA: Diagnosis not present

## 2022-07-21 DIAGNOSIS — R112 Nausea with vomiting, unspecified: Secondary | ICD-10-CM | POA: Diagnosis not present

## 2022-07-21 DIAGNOSIS — R509 Fever, unspecified: Secondary | ICD-10-CM | POA: Diagnosis not present

## 2022-07-21 DIAGNOSIS — R1030 Lower abdominal pain, unspecified: Secondary | ICD-10-CM | POA: Diagnosis not present

## 2022-07-21 DIAGNOSIS — R197 Diarrhea, unspecified: Secondary | ICD-10-CM | POA: Diagnosis not present

## 2022-07-21 DIAGNOSIS — R1032 Left lower quadrant pain: Secondary | ICD-10-CM | POA: Diagnosis not present

## 2022-07-25 ENCOUNTER — Ambulatory Visit: Payer: Self-pay | Admitting: *Deleted

## 2022-07-25 NOTE — Telephone Encounter (Signed)
Third attempt to reach pt.VM to call back left each attempt. Routing to practice for provider's resolution per protocol.

## 2022-07-25 NOTE — Telephone Encounter (Signed)
Attempted to return her call.   Left a voicemail to call back. 

## 2022-07-25 NOTE — Telephone Encounter (Signed)
2nd attempt to return her call.   Left a voicemail to call back. 

## 2022-07-25 NOTE — Telephone Encounter (Signed)
Message from Glean Salen sent at 07/25/2022  8:58 AM EDT  Summary: med ?'s   Pt called in asking if its ok, for her to take ondansetron.and  zophran sucralsate(carafate), along with levothyroxine she is taking.          Call History   Type Contact Phone/Fax User  07/25/2022 08:56 AM EDT Phone (Incoming) Hannah Vance (Self) [phone] Rexene Edison) Eliseo Gum, Deedra Ehrich

## 2022-07-26 NOTE — Telephone Encounter (Signed)
Detailed message left per DPR. CRM created. Ok for [employer] to advise if patient returns call

## 2022-07-26 NOTE — Telephone Encounter (Signed)
Please advise patient that she can take the three agents in question.

## 2022-07-28 ENCOUNTER — Other Ambulatory Visit: Payer: Self-pay | Admitting: Family Medicine

## 2022-07-28 DIAGNOSIS — F329 Major depressive disorder, single episode, unspecified: Secondary | ICD-10-CM

## 2022-07-28 NOTE — Telephone Encounter (Signed)
Requested medication (s) are due for refill today: yes  Requested medication (s) are on the active medication list: yes    Last refill: 06/06/22   #60  0 refills  Future visit scheduled yes 08/03/22  Notes to clinic:Not delegated, please review. Thank you.  Requested Prescriptions  Pending Prescriptions Disp Refills   methylphenidate (RITALIN) 10 MG tablet [Pharmacy Med Name: METHYLPHENIDATE 10 MG TABLET] 60 tablet 0    Sig: TAKE ONE TABLET BY MOUTH TWICE A DAY     Not Delegated - Psychiatry:  Stimulants/ADHD Failed - 07/28/2022 10:18 AM      Failed - This refill cannot be delegated      Failed - Urine Drug Screen completed in last 360 days      Passed - Last BP in normal range    BP Readings from Last 1 Encounters:  03/24/22 118/80         Passed - Last Heart Rate in normal range    Pulse Readings from Last 1 Encounters:  03/24/22 76         Passed - Valid encounter within last 6 months    Recent Outpatient Visits           4 months ago Frequent urination   [place] [facility] [place], [place], MD   4 months ago B12 deficiency   [place] [facility] [place], [place], MD   4 months ago Pelvic floor dysfunction   [place] [facility] [place], [place], MD   1 year ago Diffuse abdominal pain   [place] [facility] Bosie Clos, MD   1 year ago Diffuse abdominal pain    [place] [facility] Bosie Clos, MD       Future Appointments             In 6 days Simmons[place]Robinson, Tawanna Cooler, MD [facility]

## 2022-08-03 ENCOUNTER — Ambulatory Visit: Payer: Medicare HMO | Admitting: Family Medicine

## 2022-08-03 ENCOUNTER — Telehealth: Payer: Self-pay | Admitting: Family Medicine

## 2022-08-03 DIAGNOSIS — G894 Chronic pain syndrome: Secondary | ICD-10-CM

## 2022-08-03 MED ORDER — CLONAZEPAM 0.5 MG PO TABS: 0.5000 mg | ORAL_TABLET | Freq: Every evening | ORAL | 0 refills | Status: DC | PRN

## 2022-08-03 MED ORDER — OXYCODONE HCL 15 MG PO TABS
ORAL_TABLET | ORAL | 0 refills | Status: DC
Start: 2022-08-03 — End: 2022-09-14

## 2022-08-03 NOTE — Telephone Encounter (Signed)
[facility] faxed refill request for the following medications:   clonazePAM (KLONOPIN) 0.5 MG tablet    oxyCODONE (ROXICODONE) 15 MG immediate release tablet   Please advise

## 2022-08-17 ENCOUNTER — Ambulatory Visit: Payer: Medicare HMO | Admitting: Family Medicine

## 2022-08-24 DIAGNOSIS — M62838 Other muscle spasm: Secondary | ICD-10-CM | POA: Diagnosis not present

## 2022-08-24 DIAGNOSIS — G894 Chronic pain syndrome: Secondary | ICD-10-CM | POA: Diagnosis not present

## 2022-08-24 DIAGNOSIS — R1032 Left lower quadrant pain: Secondary | ICD-10-CM | POA: Diagnosis not present

## 2022-08-24 DIAGNOSIS — R102 Pelvic and perineal pain: Secondary | ICD-10-CM | POA: Diagnosis not present

## 2022-08-24 DIAGNOSIS — R300 Dysuria: Secondary | ICD-10-CM | POA: Diagnosis not present

## 2022-08-29 ENCOUNTER — Other Ambulatory Visit: Payer: Self-pay | Admitting: Family Medicine

## 2022-08-29 DIAGNOSIS — E7800 Pure hypercholesterolemia, unspecified: Secondary | ICD-10-CM

## 2022-08-30 ENCOUNTER — Telehealth: Payer: Self-pay | Admitting: Family Medicine

## 2022-08-30 DIAGNOSIS — K219 Gastro-esophageal reflux disease without esophagitis: Secondary | ICD-10-CM

## 2022-08-30 NOTE — Telephone Encounter (Signed)
[facility] pharmacy faxed refill request for the following medications:   omeprazole (PRILOSEC) 20 MG capsule    Please advise

## 2022-08-31 NOTE — Telephone Encounter (Signed)
Requested Prescriptions  Pending Prescriptions Disp Refills   rosuvastatin (CRESTOR) 10 MG tablet [Pharmacy Med Name: ROSUVASTATIN 10 MG TAB[*]] 30 tablet 0    Sig: TAKE ONE TABLET BY MOUTH ONE TIME DAILY     Cardiovascular:  Antilipid - Statins 2 Failed - 08/29/2022  6:43 PM      Failed - Lipid Panel in normal range within the last 12 months    Cholesterol, Total  Date Value Ref Range Status  07/22/2021 120 100 - 199 mg/dL Final   LDL Chol Calc (NIH)  Date Value Ref Range Status  07/22/2021 53 0 - 99 mg/dL Final   HDL  Date Value Ref Range Status  07/22/2021 52 >39 mg/dL Final   Triglycerides  Date Value Ref Range Status  07/22/2021 75 0 - 149 mg/dL Final         Passed - Cr in normal range and within 360 days    Creatinine  Date Value Ref Range Status  10/31/2013 1.39 (H) 0.60 - 1.30 mg/dL Final   Creatinine, Ser  Date Value Ref Range Status  03/24/2022 0.98 0.57 - 1.00 mg/dL Final         Passed - Patient is not pregnant      Passed - Valid encounter within last 12 months    Recent Outpatient Visits           5 months ago Frequent urination   [place] [facility] Simmons-Robinson, [place], MD   5 months ago B12 deficiency   [place] [facility] [place], [place], MD   6 months ago Pelvic floor dysfunction   [place] [facility] [place], [place], MD   1 year ago Diffuse abdominal pain   [place] [facility] Bosie Clos, MD   1 year ago Diffuse abdominal pain    [facility] Bosie Clos, MD       Future Appointments             In 6 days Simmons-Robinson, Tawanna Cooler, MD [facility]

## 2022-09-01 ENCOUNTER — Telehealth: Payer: Self-pay | Admitting: Family Medicine

## 2022-09-01 MED ORDER — OMEPRAZOLE 20 MG PO CPDR: 20.0000 mg | DELAYED_RELEASE_CAPSULE | Freq: Every day | ORAL | 0 refills | Status: DC

## 2022-09-01 NOTE — Telephone Encounter (Signed)
[facility] pharmacy is requesting prescription refill omeprazole (PRILOSEC) 20 MG capsule  Please advise

## 2022-09-01 NOTE — Telephone Encounter (Signed)
Duplicate

## 2022-09-02 ENCOUNTER — Other Ambulatory Visit: Payer: Self-pay | Admitting: Family Medicine

## 2022-09-02 DIAGNOSIS — F329 Major depressive disorder, single episode, unspecified: Secondary | ICD-10-CM

## 2022-09-02 NOTE — Telephone Encounter (Signed)
Requested medication (s) are due for refill today - yes  Requested medication (s) are on the active medication list -yes  Future visit scheduled -yes  Last refill: 07/29/22 #60  Notes to clinic: non delegated Rx  Requested Prescriptions  Pending Prescriptions Disp Refills   methylphenidate (RITALIN) 10 MG tablet [Pharmacy Med Name: METHYLPHENIDATE 10 MG TABLET] 60 tablet 0    Sig: TAKE ONE TABLET BY MOUTH TWICE A DAY     Not Delegated - Psychiatry:  Stimulants/ADHD Failed - 09/02/2022 11:42 AM      Failed - This refill cannot be delegated      Failed - Urine Drug Screen completed in last 360 days      Passed - Last BP in normal range    BP Readings from Last 1 Encounters:  03/24/22 118/80         Passed - Last Heart Rate in normal range    Pulse Readings from Last 1 Encounters:  03/24/22 76         Passed - Valid encounter within last 6 months    Recent Outpatient Visits           5 months ago Frequent urination   [place] [facility] Simmons-Robinson, [place], MD   5 months ago B12 deficiency   [place] [facility] Simmons-Robinson, [place], MD   6 months ago Pelvic floor dysfunction   [place] [facility] [place], Ken [place], MD   1 year ago Diffuse abdominal pain   [place] [facility] Bosie Clos, MD   1 year ago Diffuse abdominal pain   [place] [facility] Bosie Clos, MD       Future Appointments             In 4 days Simmons-Robinson, Tawanna Cooler, MD [facility]               Requested Prescriptions  Pending Prescriptions Disp Refills   methylphenidate (RITALIN) 10 MG tablet [Pharmacy Med Name: METHYLPHENIDATE 10 MG TABLET] 60 tablet 0    Sig: TAKE ONE TABLET BY MOUTH TWICE A DAY     Not Delegated - Psychiatry:  Stimulants/ADHD Failed - 09/02/2022 11:42 AM      Failed - This refill cannot be delegated       Failed - Urine Drug Screen completed in last 360 days      Passed - Last BP in normal range    BP Readings from Last 1 Encounters:  03/24/22 118/80         Passed - Last Heart Rate in normal range    Pulse Readings from Last 1 Encounters:  03/24/22 76         Passed - Valid encounter within last 6 months    Recent Outpatient Visits           5 months ago Frequent urination   [place] [facility] [place], [place], MD   5 months ago B12 deficiency   [place] [facility] [place], [place], MD   6 months ago Pelvic floor dysfunction   [place] [facility] [place], [place], MD   1 year ago Diffuse abdominal pain    [facility] Bosie Clos, MD   1 year ago Diffuse abdominal pain    [facility] Bosie Clos, MD       Future Appointments  In 4 days Simmons-Robinson, Tawanna Cooler, MD [facility] [place]

## 2022-09-06 ENCOUNTER — Ambulatory Visit: Payer: Medicare HMO | Admitting: Family Medicine

## 2022-09-08 ENCOUNTER — Other Ambulatory Visit: Payer: Self-pay | Admitting: Family Medicine

## 2022-09-08 NOTE — Telephone Encounter (Signed)
Pharmacy is [facility] in [place] on [address]

## 2022-09-08 NOTE — Telephone Encounter (Signed)
Medication Refill - Medication: Clonazepam  0.5 mg  Has the patient contacted their pharmacy? Yes.   (Agent: If no, request that the patient contact the pharmacy for the refill. If patient does not wish to contact the pharmacy document the reason why and proceed with request.) (Agent: If yes, when and what did the pharmacy advise?)  Preferred Pharmacy (with phone number or street name): yes Has the patient been seen for an appointment in the last year OR does the patient have an upcoming appointment? Yes.    Agent: Please be advised that RX refills may take up to 3 business days. We ask that you follow-up with your pharmacy.

## 2022-09-09 NOTE — Telephone Encounter (Signed)
Requested medication (s) are due for refill today: yes  Requested medication (s) are on the active medication list: yes  Last refill:  08/03/22  Future visit scheduled: yes  Notes to clinic:  Unable to refill per protocol, cannot delegate.      Requested Prescriptions  Pending Prescriptions Disp Refills   clonazePAM (KLONOPIN) 0.5 MG tablet 30 tablet 0    Sig: Take 1 tablet (0.5 mg total) by mouth at bedtime as needed.     Not Delegated - Psychiatry: Anxiolytics/Hypnotics 2 Failed - 09/08/2022  1:38 PM      Failed - This refill cannot be delegated      Failed - Urine Drug Screen completed in last 360 days      Passed - Patient is not pregnant      Passed - Valid encounter within last 6 months    Recent Outpatient Visits           5 months ago Frequent urination   [place] [facility] Simmons-Robinson, [place], MD   5 months ago B12 deficiency   [place] [facility] [place], [place], MD   6 months ago Pelvic floor dysfunction   [place] [facility] [name], MD   1 year ago Diffuse abdominal pain   [place] [facility] Bosie Clos, MD   1 year ago Diffuse abdominal pain   [place] [facility] Bosie Clos, MD       Future Appointments             In 1 week Simmons-Robinson, Tawanna Cooler, MD [facility]

## 2022-09-12 MED ORDER — CLONAZEPAM 0.5 MG PO TABS: 0.5000 mg | ORAL_TABLET | Freq: Every evening | ORAL | 0 refills | Status: DC | PRN

## 2022-09-13 ENCOUNTER — Other Ambulatory Visit: Payer: Self-pay | Admitting: Family Medicine

## 2022-09-13 DIAGNOSIS — G894 Chronic pain syndrome: Secondary | ICD-10-CM

## 2022-09-19 ENCOUNTER — Telehealth: Payer: Self-pay

## 2022-09-19 ENCOUNTER — Ambulatory Visit: Payer: Medicare HMO | Admitting: Family Medicine

## 2022-09-19 NOTE — Progress Notes (Deleted)
      Established patient visit   Patient: Hannah Vance   DOB: 1955-03-05   67 y.o. Female  MRN: [id number] Visit Date: 09/19/2022  Today's healthcare provider: [name], MD   No chief complaint on file.  Subjective       Discussed the use of AI scribe software for clinical note transcription with the patient, who gave verbal consent to proceed.  History of Present Illness             Medications: Outpatient Medications Prior to Visit  Medication Sig   clonazePAM (KLONOPIN) 0.5 MG tablet Take 1 tablet (0.5 mg total) by mouth at bedtime as needed.   cyclobenzaprine (FLEXERIL) 5 MG tablet Take 1 tablet (5 mg total) by mouth 3 (three) times daily as needed for muscle spasms.   fluticasone (FLONASE) 50 MCG/ACT nasal spray Place 2 sprays into both nostrils daily.   Green Tea, Camellia sinensis, (GREEN TEA PO) Take by mouth.   levothyroxine (SYNTHROID) 25 MCG tablet Take 1 tablet (25 mcg total) by mouth daily.   loratadine (CLARITIN) 10 MG tablet Take 1 tablet (10 mg total) by mouth daily.   methylphenidate (RITALIN) 10 MG tablet TAKE ONE TABLET BY MOUTH TWICE A DAY   naloxone (NARCAN) nasal spray 4 mg/0.1 mL 1 spray (4 mg) intranasally into 1 nostril. May repeat every 2 to 3 minutes in the event of narcotic overdose   NON FORMULARY Herbal Peppermint Tea   omeprazole (PRILOSEC) 20 MG capsule Take 1 capsule (20 mg total) by mouth daily.   oxyCODONE (ROXICODONE) 15 MG immediate release tablet TAKE ONE TABLET BY MOUTH EVERY 8 HOURS AS NEEDED FOR PAIN   Polyethylene Glycol 3350 (MIRALAX PO) Take by mouth.   rosuvastatin (CRESTOR) 10 MG tablet TAKE ONE TABLET BY MOUTH ONE TIME DAILY   SUMAtriptan (IMITREX) 100 MG tablet Take 1 tablet at onset of migraine. May repeat in 2 hrs. Max 2/day and 2-3 days/week.   vortioxetine HBr (TRINTELLIX) 20 MG TABS tablet Take 1 tablet (20 mg total) by mouth daily.   No facility-administered medications prior to visit.    Review of  Systems  {Insert previous labs (optional):23779} {See past labs  Heme  Chem  Endocrine  Serology  Results Review (optional):1}   Objective    There were no vitals taken for this visit. {Insert last BP/Wt (optional):23777}{See vitals history (optional):1}   Physical Exam  ***  No results found for any visits on 09/19/22.  Assessment & Plan     Problem List Items Addressed This Visit   None   Assessment and Plan              No follow-ups on file.         [name], MD  [facility] [phone] (phone) [phone] (fax)  [facility]

## 2022-09-19 NOTE — Telephone Encounter (Signed)
Copied from CRM [id number]. Topic: General - Other >> Sep 19, 2022 12:19 PM Dominique A wrote: Reason for CRM: FYI: Pt is having complaints of light headiness and is dizzy, pt states that she has diverticulosis and is very weak, pt called in to cancel her appt today at 3pm. I did offer to get pt over to NT, pt refused to speak with a nurse states that she knows what is wrong with her and is going to lay down.

## 2022-09-19 NOTE — Telephone Encounter (Signed)
Please review

## 2022-09-19 NOTE — Telephone Encounter (Signed)
Recommend evaluation when possible, may need the ED with report of dizziness and possible infection. Has she been rescheduled for today's appt ?

## 2022-09-20 NOTE — Telephone Encounter (Signed)
LMTCB-Ok for [facility] Nurse to give patient providers message

## 2022-09-21 NOTE — Telephone Encounter (Signed)
Please schedule patient for virtual visit to discuss.  It appears that she has gastroenterology appointment scheduled for October 14, 2022.  I would like to discuss her symptoms and if there are any challenges with getting to her current provider.  I would also like to determine if patient may need antibiotic treatment given history of diverticulosis.  Okay to use reserved slot in order to fit patient into schedule to soon as possible if she is continuing to feel ill.

## 2022-09-21 NOTE — Telephone Encounter (Signed)
Pt. Called back. Still has some dizziness today. "I have felt bad a long time." Has neck pain, difficulty sleeping, reports she has a kidney stone. Reports she is trying to eat and drink well. Verbalizes understanding, "I will go to ED if I feel worse." Asking for referral to GI "doctor at [facility]." Please advise pt.

## 2022-09-22 NOTE — Telephone Encounter (Signed)
Looks like there has been an appt scheduled with you for 8/28 for this patient

## 2022-09-22 NOTE — Telephone Encounter (Signed)
Received patient voicemail. LVMTCB. CRM created. Ok for [facility] to clarify per Dr.Robinson and schedule for a sooner virtual visit

## 2022-09-22 NOTE — Telephone Encounter (Signed)
Sooner is preferred given her reported symptoms and to clarify the reason for change in GI referral request

## 2022-09-23 NOTE — Telephone Encounter (Addendum)
Patient has called back and stated she does not have MyChart and does not know how to do a virtual visit. Please advise.   Patient stated her nephew may come by tomorrow and he may know how to do a virtual visit and help her with showing her how to do this?   Patient stated she is going to lay down and may not be able to answer if called back.   Patients callback # [phone]

## 2022-09-29 NOTE — Progress Notes (Deleted)
Established patient visit   Patient: Hannah Vance   DOB: 08-24-1955   67 y.o. Female  MRN: [id number] Visit Date: 10/05/2022  Today's healthcare provider: [name], MD   No chief complaint on file.  Subjective       Discussed the use of AI scribe software for clinical note transcription with the patient, who gave verbal consent to proceed.  History of Present Illness           GI referral change   Chronic pain meds   Chronic klonopin     Medications: Outpatient Medications Prior to Visit  Medication Sig   clonazePAM (KLONOPIN) 0.5 MG tablet Take 1 tablet (0.5 mg total) by mouth at bedtime as needed.   cyclobenzaprine (FLEXERIL) 5 MG tablet Take 1 tablet (5 mg total) by mouth 3 (three) times daily as needed for muscle spasms.   fluticasone (FLONASE) 50 MCG/ACT nasal spray Place 2 sprays into both nostrils daily.   Green Tea, Camellia sinensis, (GREEN TEA PO) Take by mouth.   levothyroxine (SYNTHROID) 25 MCG tablet Take 1 tablet (25 mcg total) by mouth daily.   loratadine (CLARITIN) 10 MG tablet Take 1 tablet (10 mg total) by mouth daily.   methylphenidate (RITALIN) 10 MG tablet TAKE ONE TABLET BY MOUTH TWICE A DAY   naloxone (NARCAN) nasal spray 4 mg/0.1 mL 1 spray (4 mg) intranasally into 1 nostril. May repeat every 2 to 3 minutes in the event of narcotic overdose   NON FORMULARY Herbal Peppermint Tea   omeprazole (PRILOSEC) 20 MG capsule Take 1 capsule (20 mg total) by mouth daily.   oxyCODONE (ROXICODONE) 15 MG immediate release tablet TAKE ONE TABLET BY MOUTH EVERY 8 HOURS AS NEEDED FOR PAIN   Polyethylene Glycol 3350 (MIRALAX PO) Take by mouth.   rosuvastatin (CRESTOR) 10 MG tablet TAKE ONE TABLET BY MOUTH ONE TIME DAILY   SUMAtriptan (IMITREX) 100 MG tablet Take 1 tablet at onset of migraine. May repeat in 2 hrs. Max 2/day and 2-3 days/week.   vortioxetine HBr (TRINTELLIX) 20 MG TABS tablet Take 1 tablet (20 mg total) by mouth daily.   No  facility-administered medications prior to visit.    Review of Systems  Last CBC Lab Results  Component Value Date   WBC 7.4 02/05/2021   HGB 14.6 02/05/2021   HCT 41.6 02/05/2021   MCV 88 02/05/2021   MCH 30.9 02/05/2021   RDW 12.3 02/05/2021   PLT 255 02/05/2021   Last metabolic panel Lab Results  Component Value Date   GLUCOSE 94 03/24/2022   NA 138 03/24/2022   K 4.1 03/24/2022   CL 100 03/24/2022   CO2 24 03/24/2022   BUN 7 (L) 03/24/2022   CREATININE 0.98 03/24/2022   EGFR 64 03/24/2022   CALCIUM 9.2 03/24/2022   PROT 6.5 02/05/2021   ALBUMIN 4.4 02/05/2021   LABGLOB 2.1 02/05/2021   AGRATIO 2.1 02/05/2021   BILITOT 0.6 02/05/2021   ALKPHOS 87 02/05/2021   AST 19 02/05/2021   ALT 17 02/05/2021   ANIONGAP 12 10/31/2013   Last lipids Lab Results  Component Value Date   CHOL 120 07/22/2021   HDL 52 07/22/2021   LDLCALC 53 07/22/2021   TRIG 75 07/22/2021   CHOLHDL 2.3 07/22/2021   Last thyroid functions Lab Results  Component Value Date   TSH 3.140 02/05/2021   T3TOTAL 113 07/06/2016   T4TOTAL 7.9 07/06/2016     {See past labs  Heme  Chem  Endocrine  Serology  Results Review (optional):1}   Objective    There were no vitals taken for this visit. {Insert last BP/Wt (optional):23777}{See vitals history (optional):1}   Physical Exam  ***  No results found for any visits on 10/05/22.  Assessment & Plan     Problem List Items Addressed This Visit   None   Assessment and Plan              No follow-ups on file.         [name], MD  [facility] [phone] (phone) [phone] (fax)  [facility]

## 2022-10-05 ENCOUNTER — Ambulatory Visit: Payer: Medicare HMO | Admitting: Family Medicine

## 2022-10-05 ENCOUNTER — Telehealth: Payer: Self-pay

## 2022-10-05 DIAGNOSIS — E7800 Pure hypercholesterolemia, unspecified: Secondary | ICD-10-CM

## 2022-10-05 DIAGNOSIS — Z79899 Other long term (current) drug therapy: Secondary | ICD-10-CM

## 2022-10-05 DIAGNOSIS — K219 Gastro-esophageal reflux disease without esophagitis: Secondary | ICD-10-CM

## 2022-10-05 DIAGNOSIS — F330 Major depressive disorder, recurrent, mild: Secondary | ICD-10-CM

## 2022-10-05 DIAGNOSIS — F1190 Opioid use, unspecified, uncomplicated: Secondary | ICD-10-CM

## 2022-10-05 DIAGNOSIS — K838 Other specified diseases of biliary tract: Secondary | ICD-10-CM

## 2022-10-05 DIAGNOSIS — G894 Chronic pain syndrome: Secondary | ICD-10-CM

## 2022-10-05 DIAGNOSIS — E039 Hypothyroidism, unspecified: Secondary | ICD-10-CM

## 2022-10-05 MED ORDER — SUCRALFATE 1 G PO TABS: 1.0000 g | ORAL_TABLET | Freq: Three times a day (TID) | ORAL | 2 refills | Status: AC

## 2022-10-05 NOTE — Addendum Note (Signed)
Addended by: Bing Neighbors on: 10/05/2022 04:40 PM   Modules accepted: Orders

## 2022-10-05 NOTE — Telephone Encounter (Signed)
Patient advised we will order labs and urine if necessary at appt time. Patient also wanting to talk about medication sucralfate (CARAFATE) 1 gram tablet  that was prescribed at [facility] ED.    Copied from CRM [id number]. Topic: Appointment Scheduling - Scheduling Inquiry for Clinic >> Oct 05, 2022  2:52 PM Marlow Baars wrote: Reason for CRM: The patient called in to cancel her appt with her provider on August 28th stating she just does not feel well. She rescheduled for next Wednesday hoping she can make it. She would also like for her provider to order a urinalysis and blood work since she still has some blood in her urine. Please assist patient further as she went to [facility] 2 months ago and she was prescribed medication by an emergency room doctor that helped her called Sucralfate. She wants to talk with her provider about that

## 2022-10-11 NOTE — Progress Notes (Signed)
Established patient visit   Patient: Hannah Vance   DOB: 02/09/1955   67 y.o. Female  MRN: [id number] Visit Date: 10/12/2022  Today's healthcare provider: [name], MD   No chief complaint on file.  Subjective       Discussed the use of AI scribe software for clinical note transcription with the patient, who gave verbal consent to proceed.  History of Present Illness   The patient, with a history of pelvic floor dysfunction and esophagitis, presents with ongoing gastrointestinal symptoms and weight loss. She reports a 14-pound weight loss since the last measurement, associated with difficulty eating. The patient describes a persistent pain in the left upper quadrant and generalized abdominal discomfort. She also reports frequent spasms from pelvic floor dysfunction, leading to incomplete bowel movements and small, 'squiggly' stools. The patient notes that these symptoms often exacerbate after eating, leading to fear and avoidance of food. Despite attempts to consume small amounts of food, she reports worsening symptoms with dietary variation.  The patient also reports urinary symptoms, including blood in the urine and frequent nocturia. She has been seen by multiple physicians for these symptoms, with no growth on cultures. The patient reports a recent ER visit where she was told she was passing a kidney stone. She also reports a history of polycystic kidneys in her family.  The patient lives alone and has been managing her symptoms with the help of a relative who assists with grocery shopping. She reports feeling lightheaded and weak, which she attributes to poor nutrition due to her eating difficulties. She also reports a history of anxiety and sleep disturbances, which she manages with clonazepam. The patient is also on oxycodone for pain management, which she reports needing to take more frequently due to increased pain.  The patient's symptoms have significantly  impacted her quality of life, leading to fear around eating and significant weight loss. She expresses frustration with the unpredictability of her symptoms and the impact on her daily activities.         Medications: Outpatient Medications Prior to Visit  Medication Sig   baclofen (LIORESAL) 10 MG tablet Take 1/2-1 po TID prn. May cause drowsiness.   clonazePAM (KLONOPIN) 0.5 MG tablet Take 1 tablet (0.5 mg total) by mouth at bedtime as needed.   cyclobenzaprine (FLEXERIL) 5 MG tablet Take 1 tablet (5 mg total) by mouth 3 (three) times daily as needed for muscle spasms.   fluticasone (FLONASE) 50 MCG/ACT nasal spray Place 2 sprays into both nostrils daily.   levothyroxine (SYNTHROID) 25 MCG tablet Take 1 tablet (25 mcg total) by mouth daily.   loratadine (CLARITIN) 10 MG tablet Take 1 tablet (10 mg total) by mouth daily.   methylphenidate (RITALIN) 10 MG tablet TAKE ONE TABLET BY MOUTH TWICE A DAY   naloxone (NARCAN) nasal spray 4 mg/0.1 mL 1 spray (4 mg) intranasally into 1 nostril. May repeat every 2 to 3 minutes in the event of narcotic overdose   NON FORMULARY Herbal Peppermint Tea   omeprazole (PRILOSEC) 20 MG capsule Take 1 capsule (20 mg total) by mouth daily.   ondansetron (ZOFRAN-ODT) 4 MG disintegrating tablet Take by mouth.   oxyCODONE (ROXICODONE) 15 MG immediate release tablet TAKE ONE TABLET BY MOUTH EVERY 8 HOURS AS NEEDED FOR PAIN   Polyethylene Glycol 3350 (MIRALAX PO) Take by mouth.   rosuvastatin (CRESTOR) 10 MG tablet TAKE ONE TABLET BY MOUTH ONE TIME DAILY   sucralfate (CARAFATE) 1 g tablet Take  1 tablet (1 g total) by mouth 4 (four) times daily -  with meals and at bedtime.   SUMAtriptan (IMITREX) 100 MG tablet Take 1 tablet at onset of migraine. May repeat in 2 hrs. Max 2/day and 2-3 days/week.   vortioxetine HBr (TRINTELLIX) 20 MG TABS tablet Take 1 tablet (20 mg total) by mouth daily.   [DISCONTINUED] Green Tea, Camellia sinensis, (GREEN TEA PO) Take by mouth.    No facility-administered medications prior to visit.    Review of Systems  Last CBC Lab Results  Component Value Date   WBC 7.4 02/05/2021   HGB 14.6 02/05/2021   HCT 41.6 02/05/2021   MCV 88 02/05/2021   MCH 30.9 02/05/2021   RDW 12.3 02/05/2021   PLT 255 02/05/2021   Last metabolic panel Lab Results  Component Value Date   GLUCOSE 94 03/24/2022   NA 138 03/24/2022   K 4.1 03/24/2022   CL 100 03/24/2022   CO2 24 03/24/2022   BUN 7 (L) 03/24/2022   CREATININE 0.98 03/24/2022   EGFR 64 03/24/2022   CALCIUM 9.2 03/24/2022   PROT 6.5 02/05/2021   ALBUMIN 4.4 02/05/2021   LABGLOB 2.1 02/05/2021   AGRATIO 2.1 02/05/2021   BILITOT 0.6 02/05/2021   ALKPHOS 87 02/05/2021   AST 19 02/05/2021   ALT 17 02/05/2021   ANIONGAP 12 10/31/2013   Last lipids Lab Results  Component Value Date   CHOL 120 07/22/2021   HDL 52 07/22/2021   LDLCALC 53 07/22/2021   TRIG 75 07/22/2021   CHOLHDL 2.3 07/22/2021   Last thyroid functions Lab Results  Component Value Date   TSH 3.140 02/05/2021   T3TOTAL 113 07/06/2016   T4TOTAL 7.9 07/06/2016        Objective    BP 123/87 (BP Location: Right Arm, Patient Position: Sitting, Cuff Size: Normal)   Pulse 78   Temp 98.3 F (36.8 C) (Oral)   Wt 140 lb 1.6 oz (63.5 kg)   SpO2 100%   BMI 25.62 kg/m     Physical Exam Vitals (weight loss observed, 14lbs since last visit) reviewed.  Pulmonary:     Effort: Pulmonary effort is normal. No respiratory distress.     Breath sounds: No wheezing, rhonchi or rales.  Abdominal:     General: Abdomen is flat. Bowel sounds are increased. There is no distension.     Palpations: Abdomen is soft. There is no shifting dullness, hepatomegaly or mass.     Tenderness: There is generalized abdominal tenderness.  Neurological:     Mental Status: She is alert and oriented to person, place, and time.     Comments: Ambulates with cane   Psychiatric:        Attention and Perception: Attention  normal.        Mood and Affect: Mood is anxious.        Speech: Speech is tangential.        Behavior: Behavior normal. Behavior is cooperative.       Results for orders placed or performed in visit on 10/12/22  POCT Urinalysis Dipstick  Result Value Ref Range   Color, UA     Clarity, UA     Glucose, UA Negative Negative   Bilirubin, UA negative    Ketones, UA negative    Spec Grav, UA 1.015 1.010 - 1.025   Blood, UA trace    pH, UA 6.5 5.0 - 8.0   Protein, UA Negative Negative   Urobilinogen, UA 0.2  0.2 or 1.0 E.U./dL   Nitrite, UA negative    Leukocytes, UA Negative Negative   Appearance     Odor      Assessment & Plan     Problem List Items Addressed This Visit     Anxiety - Primary    chronic Stable  Patient would benefit from psychiatry sessions given anxiety levels described today  Refills for klonopin 0.5mg  twice daily PRN for anxiety provided, continue trintellix 20mg   Chronic controlled substance contract completed today for both ritalin and klonopin        Chronic pain syndrome    On Oxycodone 15mg , sometimes twice daily, and Cyclobenzaprine 5mg  at bedtime for pain management. Also on Clonazepam for anxiety, but reports it no longer aids with sleep. -Continue current pain management regimen. -Refill Clonazepam prescription. -Completed contract agreement for Oxycodone and Clonazepam; will be scanned into patient's file       Relevant Medications   baclofen (LIORESAL) 10 MG tablet   Chronic use of benzodiazepine for therapeutic purpose    Chronic  Symptoms of anxiety managed withon klonopin 0.5mg  PRN, does not help her sleep like it used to help  Continue current regimen  Refills provided  UDS UTD, PDMP reviewed   chronic non-opioid agreement completed today for 60tabs of klonopin 0.5mg  twice daily PRN  Follow up every 3 months for chronic prescriptions       Chronic, continuous use of opioids    Chronic  Stable  Continues to have significant pain   States she has tried pain management clinic in the past as well as injections  Followed by GYN for pelvic floor dysfunction  PDMP reviewed  Chronic opioid contract reviewed and signed today        Neuropathy   Other Visit Diagnoses     Hematuria, unspecified type       Relevant Orders   POCT Urinalysis Dipstick (Completed)   Urine Culture   Urine Microscopic   Diffuse abdominal pain       Relevant Orders   Ambulatory referral to Gastroenterology           Gastrointestinal symptoms Significant weight loss (14 pounds), difficulty eating, left upper quadrant pain, and generalized abdominal discomfort. Frequent bowel movements with incomplete evacuation. Symptoms worsen with food intake, limiting dietary variety and nutritional intake. -Urgent referral to Gastroenterology. -Continue Carafate as directed, an hour before meals and two to three hours after meals. -Consider dietary modifications, such as adding papaya for bloating.  Hematuria Persistent blood in urine, with recent history of passing a kidney stone. Small cysts noted in kidneys and liver on recent imaging. Family history of polycystic kidney disease. -Collect urine sample for analysis. -Follow-up on imaging results and consider referral to Nephrology if necessary.  Pelvic floor dysfunction Experiencing spasms and incomplete bowel movements. History of hysterectomy with resultant damage. -Continue Baclofen for muscle spasms as needed. -Continue pelvic floor exercises at home.    Follow-up in one month to reassess symptoms and treatment efficacy.         Return in about 1 month (around 11/11/2022) for Pain MGMT, Dizziness, preventitive health .         [name], MD  [facility] [phone] (phone) [phone] (fax)  [facility]

## 2022-10-12 ENCOUNTER — Encounter: Payer: Self-pay | Admitting: Family Medicine

## 2022-10-12 ENCOUNTER — Ambulatory Visit (INDEPENDENT_AMBULATORY_CARE_PROVIDER_SITE_OTHER): Payer: Medicare HMO | Admitting: Family Medicine

## 2022-10-12 VITALS — BP 123/87 | HR 78 | Temp 98.3°F | Wt 140.1 lb

## 2022-10-12 DIAGNOSIS — Z79899 Other long term (current) drug therapy: Secondary | ICD-10-CM | POA: Diagnosis not present

## 2022-10-12 DIAGNOSIS — R1084 Generalized abdominal pain: Secondary | ICD-10-CM

## 2022-10-12 DIAGNOSIS — R319 Hematuria, unspecified: Secondary | ICD-10-CM

## 2022-10-12 DIAGNOSIS — F419 Anxiety disorder, unspecified: Secondary | ICD-10-CM | POA: Diagnosis not present

## 2022-10-12 DIAGNOSIS — G629 Polyneuropathy, unspecified: Secondary | ICD-10-CM

## 2022-10-12 DIAGNOSIS — G894 Chronic pain syndrome: Secondary | ICD-10-CM

## 2022-10-12 DIAGNOSIS — F1190 Opioid use, unspecified, uncomplicated: Secondary | ICD-10-CM

## 2022-10-12 LAB — POCT URINALYSIS DIPSTICK
Bilirubin, UA: NEGATIVE
Glucose, UA: NEGATIVE
Ketones, UA: NEGATIVE
Leukocytes, UA: NEGATIVE
Nitrite, UA: NEGATIVE
Protein, UA: NEGATIVE
Spec Grav, UA: 1.015 (ref 1.010–1.025)
Urobilinogen, UA: 0.2 U/dL
pH, UA: 6.5 (ref 5.0–8.0)

## 2022-10-12 MED ORDER — CLONAZEPAM 0.5 MG PO TABS: 0.5000 mg | ORAL_TABLET | Freq: Two times a day (BID) | ORAL | 0 refills | Status: DC | PRN

## 2022-10-12 NOTE — Assessment & Plan Note (Signed)
Chronic  Symptoms of anxiety managed withon klonopin 0.5mg  PRN, does not help her sleep like it used to help  Continue current regimen  Refills provided  UDS UTD, PDMP reviewed   chronic non-opioid agreement completed today for 60tabs of klonopin 0.5mg  twice daily PRN  Follow up every 3 months for chronic prescriptions

## 2022-10-12 NOTE — Assessment & Plan Note (Signed)
chronic Stable  Patient would benefit from psychiatry sessions given anxiety levels described today  Refills for klonopin 0.5mg  twice daily PRN for anxiety provided, continue trintellix 20mg   Chronic controlled substance contract completed today for both ritalin and klonopin

## 2022-10-12 NOTE — Assessment & Plan Note (Signed)
On Oxycodone 15mg , sometimes twice daily, and Cyclobenzaprine 5mg  at bedtime for pain management. Also on Clonazepam for anxiety, but reports it no longer aids with sleep. -Continue current pain management regimen. -Refill Clonazepam prescription. -Completed contract agreement for Oxycodone and Clonazepam; will be scanned into patient's file

## 2022-10-12 NOTE — Assessment & Plan Note (Signed)
Chronic  Stable  Continues to have significant pain  States she has tried pain management clinic in the past as well as injections  Followed by GYN for pelvic floor dysfunction  PDMP reviewed  Chronic opioid contract reviewed and signed today

## 2022-10-12 NOTE — Patient Instructions (Addendum)
Papaya for digestive health   Preventive Care 74 Years and Older, Female Preventive care refers to lifestyle choices and visits with your health care provider that can promote health and wellness. Preventive care visits are also called wellness exams. What can I expect for my preventive care visit? Counseling Your health care provider may ask you questions about your: Medical history, including: Past medical problems. Family medical history. Pregnancy and menstrual history. History of falls. Current health, including: Memory and ability to understand (cognition). Emotional well-being. Home life and relationship well-being. Sexual activity and sexual health. Lifestyle, including: Alcohol, nicotine or tobacco, and drug use. Access to firearms. Diet, exercise, and sleep habits. Work and work Astronomer. Sunscreen use. Safety issues such as seatbelt and bike helmet use. Physical exam Your health care provider will check your: Height and weight. These may be used to calculate your BMI (body mass index). BMI is a measurement that tells if you are at a healthy weight. Waist circumference. This measures the distance around your waistline. This measurement also tells if you are at a healthy weight and may help predict your risk of certain diseases, such as type 2 diabetes and high blood pressure. Heart rate and blood pressure. Body temperature. Skin for abnormal spots. What immunizations do I need?  Vaccines are usually given at various ages, according to a schedule. Your health care provider will recommend vaccines for you based on your age, medical history, and lifestyle or other factors, such as travel or where you work. What tests do I need? Screening Your health care provider may recommend screening tests for certain conditions. This may include: Lipid and cholesterol levels. Hepatitis C test. Hepatitis B test. HIV (human immunodeficiency virus) test. STI (sexually transmitted  infection) testing, if you are at risk. Lung cancer screening. Colorectal cancer screening. Diabetes screening. This is done by checking your blood sugar (glucose) after you have not eaten for a while (fasting). Mammogram. Talk with your health care provider about how often you should have regular mammograms. BRCA-related cancer screening. This may be done if you have a family history of breast, ovarian, tubal, or peritoneal cancers. Bone density scan. This is done to screen for osteoporosis. Talk with your health care provider about your test results, treatment options, and if necessary, the need for more tests. Follow these instructions at home: Eating and drinking  Eat a diet that includes fresh fruits and vegetables, whole grains, lean protein, and low-fat dairy products. Limit your intake of foods with high amounts of sugar, saturated fats, and salt. Take vitamin and mineral supplements as recommended by your health care provider. Do not drink alcohol if your health care provider tells you not to drink. If you drink alcohol: Limit how much you have to 0-1 drink a day. Know how much alcohol is in your drink. In the [place] one drink equals one 12 oz bottle of beer (355 mL), one 5 oz glass of wine (148 mL), or one 1 oz glass of hard liquor (44 mL). Lifestyle Brush your teeth every morning and night with fluoride toothpaste. Floss one time each day. Exercise for at least 30 minutes 5 or more days each week. Do not use any products that contain nicotine or tobacco. These products include cigarettes, chewing tobacco, and vaping devices, such as e-cigarettes. If you need help quitting, ask your health care provider. Do not use drugs. If you are sexually active, practice safe sex. Use a condom or other form of protection in order to prevent STIs.  Take aspirin only as told by your health care provider. Make sure that you understand how much to take and what form to take. Work with your health care  provider to find out whether it is safe and beneficial for you to take aspirin daily. Ask your health care provider if you need to take a cholesterol-lowering medicine (statin). Find healthy ways to manage stress, such as: Meditation, yoga, or listening to music. Journaling. Talking to a trusted person. Spending time with friends and family. Minimize exposure to UV radiation to reduce your risk of skin cancer. Safety Always wear your seat belt while driving or riding in a vehicle. Do not drive: If you have been drinking alcohol. Do not ride with someone who has been drinking. When you are tired or distracted. While texting. If you have been using any mind-altering substances or drugs. Wear a helmet and other protective equipment during sports activities. If you have firearms in your house, make sure you follow all gun safety procedures. What's next? Visit your health care provider once a year for an annual wellness visit. Ask your health care provider how often you should have your eyes and teeth checked. Stay up to date on all vaccines. This information is not intended to replace advice given to you by your health care provider. Make sure you discuss any questions you have with your health care provider. Document Revised: 07/22/2020 Document Reviewed: 07/22/2020 Elsevier Patient Education  2024 [employer].

## 2022-10-13 LAB — URINALYSIS, MICROSCOPIC ONLY
Bacteria, UA: NONE SEEN
Casts: NONE SEEN /LPF
Epithelial Cells (non renal): NONE SEEN /HPF (ref 0–10)
RBC, Urine: NONE SEEN /HPF (ref 0–2)
WBC, UA: NONE SEEN /HPF (ref 0–5)

## 2022-10-14 LAB — URINE CULTURE

## 2022-10-17 ENCOUNTER — Other Ambulatory Visit: Payer: Self-pay

## 2022-10-17 ENCOUNTER — Telehealth: Payer: Self-pay | Admitting: Family Medicine

## 2022-10-17 DIAGNOSIS — F329 Major depressive disorder, single episode, unspecified: Secondary | ICD-10-CM

## 2022-10-17 DIAGNOSIS — E7800 Pure hypercholesterolemia, unspecified: Secondary | ICD-10-CM

## 2022-10-17 MED ORDER — ROSUVASTATIN CALCIUM 10 MG PO TABS: 10.0000 mg | ORAL_TABLET | Freq: Every day | ORAL | 0 refills | Status: DC

## 2022-10-17 NOTE — Telephone Encounter (Signed)
Patient has an appointment scheduled 11/16/2022

## 2022-10-17 NOTE — Telephone Encounter (Signed)
[facility] faxed refill request for the following medications:   rosuvastatin (CRESTOR) 10 MG tablet     methylphenidate (RITALIN) 10 MG tablet   Please advise.

## 2022-10-17 NOTE — Telephone Encounter (Signed)
Refill for Ritalin send to provider.

## 2022-10-18 MED ORDER — METHYLPHENIDATE HCL 10 MG PO TABS
ORAL_TABLET | ORAL | 0 refills | Status: DC
Start: 2022-10-18 — End: 2022-12-05

## 2022-10-28 ENCOUNTER — Other Ambulatory Visit: Payer: Self-pay | Admitting: Family Medicine

## 2022-10-28 DIAGNOSIS — G894 Chronic pain syndrome: Secondary | ICD-10-CM

## 2022-10-31 ENCOUNTER — Other Ambulatory Visit: Payer: Self-pay | Admitting: Family Medicine

## 2022-10-31 DIAGNOSIS — G894 Chronic pain syndrome: Secondary | ICD-10-CM

## 2022-10-31 NOTE — Telephone Encounter (Signed)
Medication Refill - Medication: oxyCODONE (ROXICODONE) 15 MG immediate release tablet [Pharmacy Med Name: OXYCODONE 15 MG TAB[D]]    Has the patient contacted their pharmacy? Yes.   (   Preferred Pharmacy (with phone number or street name):  [facility] [address] [facility] - [place], [place] - [address]  [address] [place], [place] [address]  Phone:  [phone]  Fax:  [phone]  DEA #:  [id number]     Has the patient been seen for an appointment in the last year OR does the patient have an upcoming appointment? Yes.    Agent: Please be advised that RX refills may take up to 3 business days. We ask that you follow-up with your pharmacy.

## 2022-10-31 NOTE — Telephone Encounter (Signed)
Requested medication (s) are due for refill today:yes  Requested medication (s) are on the active medication list: yes  Last refill:  09/14/22 #80  Future visit scheduled: yes  Notes to clinic:  med not delegated to NT to reorder   Requested Prescriptions  Pending Prescriptions Disp Refills   oxyCODONE (ROXICODONE) 15 MG immediate release tablet [Pharmacy Med Name: OXYCODONE 15 MG TAB[D]] 80 tablet 0    Sig: TAKE ONE TABLET BY MOUTH EVERY 8 HOURS AS NEEDED FOR PAIN     Not Delegated - Analgesics:  Opioid Agonists Failed - 10/28/2022 10:40 AM      Failed - This refill cannot be delegated      Failed - Urine Drug Screen completed in last 360 days      Passed - Valid encounter within last 3 months    Recent Outpatient Visits           2 weeks ago Anxiety   [place] [facility] Simmons-Robinson, [place], MD   7 months ago Frequent urination   [place] [facility] [place], [place], MD   7 months ago B12 deficiency   [place] [facility] Wheeler, [place], MD   8 months ago Pelvic floor dysfunction   [place] [facility] Whitney, [place], MD   1 year ago Diffuse abdominal pain   [place] [facility] Bosie Clos, MD       Future Appointments             In 2 weeks Simmons-Robinson, Tawanna Cooler, MD [facility]

## 2022-11-01 ENCOUNTER — Other Ambulatory Visit: Payer: Self-pay | Admitting: Family Medicine

## 2022-11-01 NOTE — Telephone Encounter (Signed)
Requested medications are due for refill today.  no  Requested medications are on the active medications list.  yes  Last refill. 11/01/2022  Future visit scheduled.   yes  Notes to clinic.  Refill/refusal not delegated.    Requested Prescriptions  Pending Prescriptions Disp Refills   oxyCODONE (ROXICODONE) 15 MG immediate release tablet 80 tablet 0    Sig: TAKE ONE TABLET BY MOUTH EVERY 8 HOURS AS NEEDED FOR PAIN     Not Delegated - Analgesics:  Opioid Agonists Failed - 10/31/2022  2:11 PM      Failed - This refill cannot be delegated      Failed - Urine Drug Screen completed in last 360 days      Passed - Valid encounter within last 3 months    Recent Outpatient Visits           2 weeks ago Anxiety   [place] [facility] Simmons-Robinson, [place], MD   7 months ago Frequent urination   [place] [facility] Jakin, [place], MD   7 months ago B12 deficiency   [place] [facility] Y[place], [place], MD   8 months ago Pelvic floor dysfunction   [place] [facility] Green, [place], MD   1 year ago Diffuse abdominal pain    [facility] Bosie Clos, MD       Future Appointments             In 2 weeks Simmons-Robinson, Tawanna Cooler, MD [facility]

## 2022-11-02 NOTE — Progress Notes (Signed)
[facility] Quality Team Note  Name: Hannah Vance Date of Birth: 02-Jun-1955 MRN: [id number] Date: 11/02/2022  [facility] Quality Team has reviewed this patient's chart, please see recommendations below:  [facility] Quality Other; (BCS GAP- PATIENT NEEDS BREAST CANCER SCREENING BEFORE 02/07/2023 FOR GAP CLOSURE. PLEASE ADDRESS WITH PATIENT AT UPCOMING PCP VISIT 11/16/2022)

## 2022-11-02 NOTE — Telephone Encounter (Signed)
When ordered class: printed- will send to requesting pharmacy the remainder of original refill  Requested Prescriptions  Pending Prescriptions Disp Refills   TRINTELLIX 20 MG TABS tablet [Pharmacy Med Name: TRINTELLIX 20MG  TAB] 90 tablet 1    Sig: TAKE ONE TABLET BY MOUTH EVERY DAY     Psychiatry: Antidepressants - Serotonin Modulator Passed - 11/01/2022 10:30 AM      Passed - Completed PHQ-2 or PHQ-9 in the last 360 days      Passed - Valid encounter within last 6 months    Recent Outpatient Visits           3 weeks ago Anxiety   [place] [facility] Simmons-Robinson, [place], MD   7 months ago Frequent urination   [place] [facility] Fletcher, [place], MD   7 months ago B12 deficiency   [facility] Melcher-[place], [place], MD   8 months ago Pelvic floor dysfunction   [place] [facility] [place], Tawanna Cooler, MD   1 year ago Diffuse abdominal pain   [place] [facility] Bosie Clos, MD       Future Appointments             In 2 weeks Simmons-Robinson, Tawanna Cooler, MD [facility], [facility]

## 2022-11-14 ENCOUNTER — Telehealth: Payer: Self-pay | Admitting: Family Medicine

## 2022-11-14 ENCOUNTER — Other Ambulatory Visit: Payer: Self-pay

## 2022-11-14 DIAGNOSIS — E7800 Pure hypercholesterolemia, unspecified: Secondary | ICD-10-CM

## 2022-11-14 NOTE — Telephone Encounter (Signed)
[facility] is requesting prescription refill rosuvastatin (CRESTOR) 10 MG tablet  Please advise

## 2022-11-15 MED ORDER — ROSUVASTATIN CALCIUM 10 MG PO TABS
10.0000 mg | ORAL_TABLET | Freq: Every day | ORAL | 1 refills | Status: DC
Start: 2022-11-15 — End: 2023-05-09

## 2022-11-16 ENCOUNTER — Ambulatory Visit: Payer: Medicare HMO | Admitting: Family Medicine

## 2022-11-16 NOTE — Progress Notes (Deleted)
Established patient visit   Patient: Hannah Vance   DOB: [birth year]   67 y.o. Female  MRN: [id number] Visit Date: 11/16/2022  Today's healthcare provider: [name], MD   No chief complaint on file.  Subjective       Discussed the use of AI scribe software for clinical note transcription with the patient, who gave verbal consent to proceed.  History of Present Illness             No past medical history on file.  Medications: Outpatient Medications Prior to Visit  Medication Sig   baclofen (LIORESAL) 10 MG tablet Take 1/2-1 po TID prn. May cause drowsiness.   clonazePAM (KLONOPIN) 0.5 MG tablet Take 1 tablet (0.5 mg total) by mouth 2 (two) times daily as needed.   cyclobenzaprine (FLEXERIL) 5 MG tablet Take 1 tablet (5 mg total) by mouth 3 (three) times daily as needed for muscle spasms.   fluticasone (FLONASE) 50 MCG/ACT nasal spray Place 2 sprays into both nostrils daily.   levothyroxine (SYNTHROID) 25 MCG tablet Take 1 tablet (25 mcg total) by mouth daily.   loratadine (CLARITIN) 10 MG tablet Take 1 tablet (10 mg total) by mouth daily.   methylphenidate (RITALIN) 10 MG tablet TAKE ONE TABLET BY MOUTH TWICE A DAY   naloxone (NARCAN) nasal spray 4 mg/0.1 mL 1 spray (4 mg) intranasally into 1 nostril. May repeat every 2 to 3 minutes in the event of narcotic overdose   NON FORMULARY Herbal Peppermint Tea   omeprazole (PRILOSEC) 20 MG capsule Take 1 capsule (20 mg total) by mouth daily.   ondansetron (ZOFRAN-ODT) 4 MG disintegrating tablet Take by mouth.   oxyCODONE (ROXICODONE) 15 MG immediate release tablet TAKE ONE TABLET BY MOUTH EVERY 8 HOURS AS NEEDED FOR PAIN   Polyethylene Glycol 3350 (MIRALAX PO) Take by mouth.   rosuvastatin (CRESTOR) 10 MG tablet Take 1 tablet (10 mg total) by mouth daily.   sucralfate (CARAFATE) 1 g tablet Take 1 tablet (1 g total) by mouth 4 (four) times daily -  with meals and at bedtime.   SUMAtriptan (IMITREX) 100 MG  tablet Take 1 tablet at onset of migraine. May repeat in 2 hrs. Max 2/day and 2-3 days/week.   TRINTELLIX 20 MG TABS tablet TAKE ONE TABLET BY MOUTH EVERY DAY   No facility-administered medications prior to visit.    Review of Systems  Last metabolic panel Lab Results  Component Value Date   GLUCOSE 94 03/24/2022   NA 138 03/24/2022   K 4.1 03/24/2022   CL 100 03/24/2022   CO2 24 03/24/2022   BUN 7 (L) 03/24/2022   CREATININE 0.98 03/24/2022   EGFR 64 03/24/2022   CALCIUM 9.2 03/24/2022   PROT 6.5 02/05/2021   ALBUMIN 4.4 02/05/2021   LABGLOB 2.1 02/05/2021   AGRATIO 2.1 02/05/2021   BILITOT 0.6 02/05/2021   ALKPHOS 87 02/05/2021   AST 19 02/05/2021   ALT 17 02/05/2021   ANIONGAP 12 10/31/2013   Last hemoglobin A1c Lab Results  Component Value Date   HGBA1C 5.5 07/06/2016   Last thyroid functions Lab Results  Component Value Date   TSH 3.140 02/05/2021   T3TOTAL 113 07/06/2016   T4TOTAL 7.9 07/06/2016     {See past labs  Heme  Chem  Endocrine  Serology  Results Review (optional):1}   Objective    There were no vitals taken for this visit. {Insert last BP/Wt (optional):23777}{See vitals history (optional):1}  Physical Exam  ***  No results found for any visits on 11/16/22.  Assessment & Plan     Problem List Items Addressed This Visit   None   Assessment and Plan              No follow-ups on file.         [name], MD  [facility] [phone] (phone) [phone] (fax)  [facility]

## 2022-11-23 ENCOUNTER — Ambulatory Visit: Payer: Medicare HMO | Admitting: Family Medicine

## 2022-11-24 ENCOUNTER — Telehealth: Payer: Self-pay

## 2022-11-24 NOTE — Telephone Encounter (Signed)
Copied from CRM [id number]. Topic: Appointment Scheduling - Scheduling Inquiry for Clinic >> Nov 24, 2022 12:16 PM Teressa P wrote: Reason for CRM: pt does not want to do a well visit.  With medicare

## 2022-11-27 ENCOUNTER — Other Ambulatory Visit: Payer: Self-pay | Admitting: Family Medicine

## 2022-11-27 DIAGNOSIS — K219 Gastro-esophageal reflux disease without esophagitis: Secondary | ICD-10-CM

## 2022-11-28 ENCOUNTER — Ambulatory Visit: Payer: Medicare HMO | Admitting: Family Medicine

## 2022-11-29 NOTE — Telephone Encounter (Signed)
Requested medication (s) are due for refill today: yes   Requested medication (s) are on the active medication list: yes   Last refill:  09/01/22 #90 0 refills  Future visit scheduled:  yes in 6 days   Notes to clinic:  no refills remain.  Do you want to continue to refill Rx?     Requested Prescriptions  Pending Prescriptions Disp Refills   omeprazole (PRILOSEC) 20 MG capsule [Pharmacy Med Name: OMEPRAZOLE 20 MG CAP[*]] 90 capsule 0    Sig: TAKE ONE CAPSULE BY MOUTH ONE TIME DAILY     Gastroenterology: Proton Pump Inhibitors Passed - 11/27/2022  3:39 PM      Passed - Valid encounter within last 12 months    Recent Outpatient Visits           1 month ago Anxiety   [place] [facility] Simmons-Robinson, [place], MD   8 months ago Frequent urination   [place] [facility] [place], [place], MD   8 months ago B12 deficiency   [place] [facility] Simmons-Robinson, [place], MD   9 months ago Pelvic floor dysfunction   [place] [facility] [name], MD   1 year ago Diffuse abdominal pain   [place] [facility] Bosie Clos, MD       Future Appointments             In 6 days Simmons-Robinson, Tawanna Cooler, MD [facility]

## 2022-12-02 NOTE — Progress Notes (Unsigned)
Established patient visit   Patient: Hannah Vance   DOB: 10/21/1955   67 y.o. Female  MRN: [id number] Visit Date: 12/05/2022  Today's healthcare provider: [name], MD   No chief complaint on file.  Subjective       Discussed the use of AI scribe software for clinical note transcription with the patient, who gave verbal consent to proceed.  History of Present Illness             No past medical history on file.  Medications: Outpatient Medications Prior to Visit  Medication Sig   baclofen (LIORESAL) 10 MG tablet Take 1/2-1 po TID prn. May cause drowsiness.   clonazePAM (KLONOPIN) 0.5 MG tablet Take 1 tablet (0.5 mg total) by mouth 2 (two) times daily as needed.   cyclobenzaprine (FLEXERIL) 5 MG tablet Take 1 tablet (5 mg total) by mouth 3 (three) times daily as needed for muscle spasms.   fluticasone (FLONASE) 50 MCG/ACT nasal spray Place 2 sprays into both nostrils daily.   levothyroxine (SYNTHROID) 25 MCG tablet Take 1 tablet (25 mcg total) by mouth daily.   loratadine (CLARITIN) 10 MG tablet Take 1 tablet (10 mg total) by mouth daily.   methylphenidate (RITALIN) 10 MG tablet TAKE ONE TABLET BY MOUTH TWICE A DAY   naloxone (NARCAN) nasal spray 4 mg/0.1 mL 1 spray (4 mg) intranasally into 1 nostril. May repeat every 2 to 3 minutes in the event of narcotic overdose   NON FORMULARY Herbal Peppermint Tea   omeprazole (PRILOSEC) 20 MG capsule TAKE ONE CAPSULE BY MOUTH ONE TIME DAILY   ondansetron (ZOFRAN-ODT) 4 MG disintegrating tablet Take by mouth.   oxyCODONE (ROXICODONE) 15 MG immediate release tablet TAKE ONE TABLET BY MOUTH EVERY 8 HOURS AS NEEDED FOR PAIN   Polyethylene Glycol 3350 (MIRALAX PO) Take by mouth.   rosuvastatin (CRESTOR) 10 MG tablet Take 1 tablet (10 mg total) by mouth daily.   sucralfate (CARAFATE) 1 g tablet Take 1 tablet (1 g total) by mouth 4 (four) times daily -  with meals and at bedtime.   SUMAtriptan (IMITREX) 100 MG  tablet Take 1 tablet at onset of migraine. May repeat in 2 hrs. Max 2/day and 2-3 days/week.   TRINTELLIX 20 MG TABS tablet TAKE ONE TABLET BY MOUTH EVERY DAY   No facility-administered medications prior to visit.    Review of Systems  Last metabolic panel Lab Results  Component Value Date   GLUCOSE 94 03/24/2022   NA 138 03/24/2022   K 4.1 03/24/2022   CL 100 03/24/2022   CO2 24 03/24/2022   BUN 7 (L) 03/24/2022   CREATININE 0.98 03/24/2022   EGFR 64 03/24/2022   CALCIUM 9.2 03/24/2022   PROT 6.5 02/05/2021   ALBUMIN 4.4 02/05/2021   LABGLOB 2.1 02/05/2021   AGRATIO 2.1 02/05/2021   BILITOT 0.6 02/05/2021   ALKPHOS 87 02/05/2021   AST 19 02/05/2021   ALT 17 02/05/2021   ANIONGAP 12 10/31/2013   Last hemoglobin A1c Lab Results  Component Value Date   HGBA1C 5.5 07/06/2016   Last thyroid functions Lab Results  Component Value Date   TSH 3.140 02/05/2021   T3TOTAL 113 07/06/2016   T4TOTAL 7.9 07/06/2016     {See past labs  Heme  Chem  Endocrine  Serology  Results Review (optional):1}   Objective    There were no vitals taken for this visit. {Insert last BP/Wt (optional):23777}{See vitals history (optional):1}   Physical  Exam  ***  No results found for any visits on 12/05/22.  Assessment & Plan     Problem List Items Addressed This Visit   None   Assessment and Plan              No follow-ups on file.         [name], MD  [facility] [phone] (phone) [phone] (fax)  [facility]

## 2022-12-05 ENCOUNTER — Ambulatory Visit (INDEPENDENT_AMBULATORY_CARE_PROVIDER_SITE_OTHER): Payer: Medicare HMO | Admitting: Family Medicine

## 2022-12-05 ENCOUNTER — Encounter: Payer: Self-pay | Admitting: Family Medicine

## 2022-12-05 VITALS — BP 122/72 | HR 91 | Wt 140.0 lb

## 2022-12-05 DIAGNOSIS — Z23 Encounter for immunization: Secondary | ICD-10-CM

## 2022-12-05 DIAGNOSIS — F419 Anxiety disorder, unspecified: Secondary | ICD-10-CM | POA: Diagnosis not present

## 2022-12-05 DIAGNOSIS — G8929 Other chronic pain: Secondary | ICD-10-CM

## 2022-12-05 DIAGNOSIS — E7800 Pure hypercholesterolemia, unspecified: Secondary | ICD-10-CM | POA: Diagnosis not present

## 2022-12-05 DIAGNOSIS — G894 Chronic pain syndrome: Secondary | ICD-10-CM

## 2022-12-05 DIAGNOSIS — E039 Hypothyroidism, unspecified: Secondary | ICD-10-CM

## 2022-12-05 DIAGNOSIS — K219 Gastro-esophageal reflux disease without esophagitis: Secondary | ICD-10-CM

## 2022-12-05 DIAGNOSIS — F330 Major depressive disorder, recurrent, mild: Secondary | ICD-10-CM

## 2022-12-05 DIAGNOSIS — R102 Pelvic and perineal pain: Secondary | ICD-10-CM | POA: Diagnosis not present

## 2022-12-05 MED ORDER — OMEPRAZOLE 20 MG PO CPDR
20.0000 mg | DELAYED_RELEASE_CAPSULE | Freq: Every day | ORAL | 1 refills | Status: DC
Start: 2022-12-05 — End: 2023-06-06

## 2022-12-05 MED ORDER — METHYLPHENIDATE HCL 10 MG PO TABS
ORAL_TABLET | ORAL | 0 refills | Status: DC
Start: 2022-12-05 — End: 2023-01-16

## 2022-12-05 MED ORDER — LEVOTHYROXINE SODIUM 25 MCG PO TABS
25.0000 ug | ORAL_TABLET | Freq: Every day | ORAL | 0 refills | Status: DC
Start: 2022-12-05 — End: 2023-03-06

## 2022-12-05 MED ORDER — OXYCODONE HCL 15 MG PO TABS: ORAL_TABLET | ORAL | 0 refills | Status: DC

## 2022-12-05 NOTE — Patient Instructions (Signed)
Your gastroenterology appt was scheduled for 03/06/23

## 2022-12-06 ENCOUNTER — Ambulatory Visit: Payer: Medicare HMO | Admitting: Family Medicine

## 2022-12-06 LAB — TSH+T4F+T3FREE
Free T4: 1.37 ng/dL (ref 0.82–1.77)
T3, Free: 2.8 pg/mL (ref 2.0–4.4)
TSH: 2.33 u[IU]/mL (ref 0.450–4.500)

## 2022-12-08 ENCOUNTER — Telehealth: Payer: Self-pay | Admitting: Family Medicine

## 2022-12-08 NOTE — Telephone Encounter (Signed)
Contacted Darci Needle to schedule their annual wellness visit. Patient declined to schedule AWV at this time.  Thank you,  [facility] Support [facility] Direct dial  [phone]

## 2022-12-16 ENCOUNTER — Telehealth: Payer: Self-pay | Admitting: Family Medicine

## 2022-12-16 DIAGNOSIS — F419 Anxiety disorder, unspecified: Secondary | ICD-10-CM

## 2022-12-16 NOTE — Telephone Encounter (Signed)
[facility] is requesting prescription refill clonazePAM (KLONOPIN) 0.5 MG tablet   Please advise

## 2022-12-19 MED ORDER — CLONAZEPAM 0.5 MG PO TABS: 0.5000 mg | ORAL_TABLET | Freq: Two times a day (BID) | ORAL | 0 refills | Status: DC | PRN

## 2022-12-19 NOTE — Telephone Encounter (Signed)
Refill submitted. 

## 2023-01-16 ENCOUNTER — Other Ambulatory Visit: Payer: Self-pay | Admitting: Family Medicine

## 2023-01-16 DIAGNOSIS — F419 Anxiety disorder, unspecified: Secondary | ICD-10-CM

## 2023-01-16 DIAGNOSIS — G894 Chronic pain syndrome: Secondary | ICD-10-CM

## 2023-01-16 MED ORDER — METHYLPHENIDATE HCL 10 MG PO TABS: ORAL_TABLET | ORAL | 0 refills | Status: DC

## 2023-01-16 MED ORDER — OXYCODONE HCL 15 MG PO TABS: ORAL_TABLET | ORAL | 0 refills | Status: DC

## 2023-01-16 NOTE — Telephone Encounter (Signed)
[facility] faxed refill request for the following medications:   methylphenidate (RITALIN) 10 MG tablet   oxyCODONE (ROXICODONE) 15 MG immediate release tablet    Please advise.

## 2023-02-06 ENCOUNTER — Telehealth: Payer: Self-pay | Admitting: Family Medicine

## 2023-02-06 ENCOUNTER — Other Ambulatory Visit: Payer: Self-pay

## 2023-02-06 DIAGNOSIS — F419 Anxiety disorder, unspecified: Secondary | ICD-10-CM

## 2023-02-06 NOTE — Telephone Encounter (Signed)
[facility] faxed refill request for the following medications:   clonazePAM (KLONOPIN) 0.5 MG tablet    Please advise.

## 2023-02-06 NOTE — Telephone Encounter (Signed)
[facility] is requesting

## 2023-02-07 MED ORDER — CLONAZEPAM 0.5 MG PO TABS: 0.5000 mg | ORAL_TABLET | Freq: Two times a day (BID) | ORAL | 0 refills | Status: DC | PRN

## 2023-02-10 ENCOUNTER — Other Ambulatory Visit: Payer: Self-pay | Admitting: Family Medicine

## 2023-02-14 ENCOUNTER — Telehealth: Payer: Self-pay | Admitting: Pharmacist

## 2023-02-14 ENCOUNTER — Other Ambulatory Visit: Payer: Self-pay | Admitting: Pharmacist

## 2023-02-14 NOTE — Progress Notes (Signed)
   02/14/2023  Patient ID: Hannah Vance, female   DOB: September 25, 1955, 68 y.o.   MRN: [id number]  Unable to reach patient today. Left HIPAA compliant voicemail requesting call back at her earliest convenience to start the [place] application.    Aloysius Breeding, PharmD [facility] Phone Number: [phone]

## 2023-02-22 ENCOUNTER — Telehealth: Payer: Self-pay | Admitting: Pharmacist

## 2023-02-22 NOTE — Progress Notes (Addendum)
   02/22/2023  Patient ID: Hannah Vance, female   DOB: 1955/05/09, 68 y.o.   MRN: [id number]  Unable to reach patient today. Left HIPAA compliant voicemail requesting call back at her earliest convenience to start the [place] application.   Second attempt. Will try again in 2 weeks.  Update from 03/08/23: - Completed Trintillex paperwork at 03/07/23 office visit   Marlowe Aschoff, PharmD [facility] Phone Number: [phone]

## 2023-02-28 ENCOUNTER — Telehealth: Payer: Self-pay | Admitting: Family Medicine

## 2023-02-28 NOTE — Telephone Encounter (Signed)
[facility] is requesting refill methylphenidate (RITALIN) 10 MG tablet  Please advise

## 2023-02-28 NOTE — Telephone Encounter (Signed)
[facility] is requesting refill oxyCODONE (ROXICODONE) 15 MG immediate release tablet   Please advise

## 2023-02-28 NOTE — Telephone Encounter (Signed)
Has appt 1/28 will be address then

## 2023-02-28 NOTE — Telephone Encounter (Signed)
Has appt 1/28 will address then

## 2023-03-06 ENCOUNTER — Other Ambulatory Visit: Payer: Self-pay

## 2023-03-06 ENCOUNTER — Telehealth: Payer: Self-pay | Admitting: Family Medicine

## 2023-03-06 DIAGNOSIS — F419 Anxiety disorder, unspecified: Secondary | ICD-10-CM

## 2023-03-06 DIAGNOSIS — G894 Chronic pain syndrome: Secondary | ICD-10-CM

## 2023-03-06 DIAGNOSIS — E039 Hypothyroidism, unspecified: Secondary | ICD-10-CM

## 2023-03-06 NOTE — Telephone Encounter (Signed)
[facility] is requesting refill oxyCODONE (ROXICODONE) 15 MG immediate release tablet   Please advise

## 2023-03-06 NOTE — Telephone Encounter (Signed)
Received fax from [facility] asking for refills on Levothyroxine 25 mg. #90

## 2023-03-06 NOTE — Telephone Encounter (Signed)
2nd request-[facility] faxed refill request for the following medications:   methylphenidate (RITALIN) 10 MG tablet   Please advise

## 2023-03-07 ENCOUNTER — Encounter: Payer: Self-pay | Admitting: Family Medicine

## 2023-03-07 ENCOUNTER — Ambulatory Visit (INDEPENDENT_AMBULATORY_CARE_PROVIDER_SITE_OTHER): Payer: Medicare HMO | Admitting: Family Medicine

## 2023-03-07 VITALS — BP 124/80 | HR 77 | Ht 62.0 in | Wt 138.0 lb

## 2023-03-07 DIAGNOSIS — K838 Other specified diseases of biliary tract: Secondary | ICD-10-CM

## 2023-03-07 DIAGNOSIS — F419 Anxiety disorder, unspecified: Secondary | ICD-10-CM

## 2023-03-07 DIAGNOSIS — R634 Abnormal weight loss: Secondary | ICD-10-CM

## 2023-03-07 DIAGNOSIS — G894 Chronic pain syndrome: Secondary | ICD-10-CM

## 2023-03-07 DIAGNOSIS — F330 Major depressive disorder, recurrent, mild: Secondary | ICD-10-CM | POA: Diagnosis not present

## 2023-03-07 DIAGNOSIS — E039 Hypothyroidism, unspecified: Secondary | ICD-10-CM | POA: Diagnosis not present

## 2023-03-07 DIAGNOSIS — R1084 Generalized abdominal pain: Secondary | ICD-10-CM

## 2023-03-07 MED ORDER — OXYCODONE HCL 15 MG PO TABS: ORAL_TABLET | ORAL | 0 refills | Status: DC

## 2023-03-07 MED ORDER — LEVOTHYROXINE SODIUM 25 MCG PO TABS: 25.0000 ug | ORAL_TABLET | Freq: Every day | ORAL | 2 refills | Status: AC

## 2023-03-07 MED ORDER — METHYLPHENIDATE HCL 10 MG PO TABS: ORAL_TABLET | ORAL | 0 refills | Status: DC

## 2023-03-07 NOTE — Progress Notes (Signed)
Established patient visit   Patient: Hannah Vance   DOB: Aug 07, 1955   68 y.o. Female  MRN: [id number] Visit Date: 03/07/2023  Today's healthcare provider: [name], MD   Chief Complaint  Patient presents with   Follow-up   Subjective     HPI   Anxiety  Medication refill-oxy Last edited by Shelly Bombard, CMA on 03/07/2023  2:04 PM.       Discussed the use of AI scribe software for clinical note transcription with the patient, who gave verbal consent to proceed.  History of Present Illness   The patient is a 68 year old female with chronic pain and anxiety who presents for follow-up on pain management and anxiety.  She experiences ongoing pelvic floor pain and spasms, which are severe and persistent, significantly impacting her daily activities. She is currently taking oxycodone 15 mg, which was recently refilled, and she manages her doses carefully to avoid running out.  She is also taking Ritalin 10 mg twice a day and has encountered issues with medication refill timing. She attempts to manage this by calling in prescriptions early to avoid financial strain from multiple payments at once.  Her anxiety and depression are stable, and she is taking Trintellix 20 mg daily. Her mood has been fine, although she has been taking it easy physically due to her pain and recent family illnesses.  She has experienced gastrointestinal issues, including bloating, gas with a sulfur smell, and nausea. She attributes recent unintentional weight loss of two pounds to poor sleep and pain affecting her appetite. She is under the care of a GI doctor but has had issues with scheduling appointments.  She has a history of a colonoscopy that was not fully successful due to inadequate bowel preparation. She experiences infrequent bowel movements, requiring laxatives for relief, and has concerns about changes in her bowel movements, including 'pencil-shaped' stools, worrying about  potential underlying conditions like diverticulitis or colon cancer.  She has a history of kidney stones, identified in a previous MRI, and experiences persistent pain attributed to the stones. She has been advised against surgery due to potential complications and has tried various pain management strategies, including mental distraction techniques.         History reviewed. No pertinent past medical history.  Medications: Outpatient Medications Prior to Visit  Medication Sig   baclofen (LIORESAL) 10 MG tablet Take 1/2-1 po TID prn. May cause drowsiness.   clonazePAM (KLONOPIN) 0.5 MG tablet Take 1 tablet (0.5 mg total) by mouth 2 (two) times daily as needed.   cyclobenzaprine (FLEXERIL) 5 MG tablet Take 1 tablet (5 mg total) by mouth 3 (three) times daily as needed for muscle spasms.   fluticasone (FLONASE) 50 MCG/ACT nasal spray Place 2 sprays into both nostrils daily.   levothyroxine (SYNTHROID) 25 MCG tablet Take 1 tablet (25 mcg total) by mouth daily.   loratadine (CLARITIN) 10 MG tablet Take 1 tablet (10 mg total) by mouth daily.   methylphenidate (RITALIN) 10 MG tablet TAKE ONE TABLET BY MOUTH TWICE A DAY   omeprazole (PRILOSEC) 20 MG capsule Take 1 capsule (20 mg total) by mouth daily.   ondansetron (ZOFRAN-ODT) 4 MG disintegrating tablet Take by mouth.   oxyCODONE (ROXICODONE) 15 MG immediate release tablet TAKE ONE TABLET BY MOUTH EVERY 8 HOURS AS NEEDED FOR PAIN   Polyethylene Glycol 3350 (MIRALAX PO) Take by mouth.   rosuvastatin (CRESTOR) 10 MG tablet Take 1 tablet (10 mg total) by  mouth daily.   SUMAtriptan (IMITREX) 100 MG tablet Take 1 tablet at onset of migraine. May repeat in 2 hrs. Max 2/day and 2-3 days/week.   TRINTELLIX 20 MG TABS tablet TAKE ONE TABLET BY MOUTH EVERY DAY   naloxone (NARCAN) nasal spray 4 mg/0.1 mL 1 spray (4 mg) intranasally into 1 nostril. May repeat every 2 to 3 minutes in the event of narcotic overdose (Patient not taking: Reported on 03/07/2023)    NON FORMULARY Herbal Peppermint Tea   sucralfate (CARAFATE) 1 g tablet Take 1 tablet (1 g total) by mouth 4 (four) times daily -  with meals and at bedtime. (Patient not taking: Reported on 03/07/2023)   No facility-administered medications prior to visit.    Review of Systems  Last CBC Lab Results  Component Value Date   WBC 7.4 02/05/2021   HGB 14.6 02/05/2021   HCT 41.6 02/05/2021   MCV 88 02/05/2021   MCH 30.9 02/05/2021   RDW 12.3 02/05/2021   PLT 255 02/05/2021   Last metabolic panel Lab Results  Component Value Date   GLUCOSE 94 03/24/2022   NA 138 03/24/2022   K 4.1 03/24/2022   CL 100 03/24/2022   CO2 24 03/24/2022   BUN 7 (L) 03/24/2022   CREATININE 0.98 03/24/2022   EGFR 64 03/24/2022   CALCIUM 9.2 03/24/2022   PROT 6.5 02/05/2021   ALBUMIN 4.4 02/05/2021   LABGLOB 2.1 02/05/2021   AGRATIO 2.1 02/05/2021   BILITOT 0.6 02/05/2021   ALKPHOS 87 02/05/2021   AST 19 02/05/2021   ALT 17 02/05/2021   ANIONGAP 12 10/31/2013   Last hemoglobin A1c Lab Results  Component Value Date   HGBA1C 5.5 07/06/2016   Last thyroid functions Lab Results  Component Value Date   TSH 2.330 12/05/2022   T3TOTAL 113 07/06/2016   T4TOTAL 7.9 07/06/2016        Objective    BP 124/80 (BP Location: Right Arm, Patient Position: Sitting, Cuff Size: Normal)   Pulse 77   Ht 5\' 2"  (1.575 m)   Wt 138 lb (62.6 kg)   SpO2 97%   BMI 25.24 kg/m  BP Readings from Last 3 Encounters:  03/07/23 124/80  12/05/22 122/72  10/12/22 123/87   Wt Readings from Last 3 Encounters:  03/07/23 138 lb (62.6 kg)  12/05/22 140 lb (63.5 kg)  10/12/22 140 lb 1.6 oz (63.5 kg)        Physical Exam  General: Alert, no acute distress Cardio: Normal S1 and S2, RRR, no r/m/g Pulm: CTAB, normal work of breathing ABD: diffuse abdominal tenderness, decreased abdominal sounds, soft    No results found for any visits on 03/07/23.  Assessment & Plan     Problem List Items Addressed This  Visit       Endocrine   Hypothyroidism (acquired) - Primary   Stable Symptoms well controlled  Continue current treatment plan  Continue Synthroid 25mg  daily per patient request Will need thyroid levels checked at next CPE           Other   MDD (major depressive disorder), recurrent episode, mild (HCC)   Chronic  Stable  Continue trintellix and klonopin PRN       Chronic pain syndrome   Ongoing pain and spasms related to pelvic floor issues. Scheduled for pelvic floor injections on Friday. Requires oxycodone for pain management. Discussed potential withdrawal symptoms if medication is missed, including nausea and diarrhea. Prefers to avoid surgery due to risk of further  damage and potential increased disability. - Administer pelvic floor injections on Friday with GYN scheduled  - Prescribe refills for  oxycodone 15 mg every 8 hours  - Advise to confirm medication availability with pharmacy      Anxiety   Chronic,stable Mood well-managed with Trintellix 20 mg daily. - Continue Trintellix 20 mg daily -completed patient assistance forms for trintellix prescription during visit today  -previously referred to pharmacy for assistance, pt declined stating she could complete paperwork independently  - continue klonopin 0.5mg  twice daily PRN      Other Visit Diagnoses       Diffuse abdominal pain       Relevant Orders   Ambulatory referral to Gastroenterology     Common bile duct dilatation       Relevant Orders   Ambulatory referral to Gastroenterology     Unintentional weight loss       Relevant Orders   Ambulatory referral to Gastroenterology         Gastrointestinal Issues Reports sulfur-smelling gas, bloating, and changes in bowel movements. Recent unintentional weight loss. Previous colonoscopy was unreadable. Concerned about potential colon cancer or diverticulitis. Prefers not to travel far for appointments due to logistical challenges. - Refer to GI  specialist for follow-up and potential colonoscopy - Review previous MRI and ultrasound results for kidney stones and other findings - Monitor weight and gastrointestinal symptoms   General Health Maintenance Declined bone density scan. Mammogram to be scheduled by Dr. Iona Hansen. - Ensure Dr. Iona Hansen schedules mammogram         Return in about 4 months (around 07/05/2023) for AWV.         [name], MD  [facility] [phone] (phone) [phone] (fax)  [facility]

## 2023-03-07 NOTE — Assessment & Plan Note (Signed)
Stable Symptoms well controlled  Continue current treatment plan  Continue Synthroid 25mg  daily per patient request Will need thyroid levels checked at next CPE

## 2023-03-07 NOTE — Assessment & Plan Note (Signed)
Chronic,stable Mood well-managed with Trintellix 20 mg daily. - Continue Trintellix 20 mg daily -completed patient assistance forms for trintellix prescription during visit today  -previously referred to pharmacy for assistance, pt declined stating she could complete paperwork independently  - continue klonopin 0.5mg  twice daily PRN

## 2023-03-07 NOTE — Assessment & Plan Note (Signed)
Chronic  Stable  Continue trintellix and klonopin PRN

## 2023-03-07 NOTE — Assessment & Plan Note (Signed)
Ongoing pain and spasms related to pelvic floor issues. Scheduled for pelvic floor injections on Friday. Requires oxycodone for pain management. Discussed potential withdrawal symptoms if medication is missed, including nausea and diarrhea. Prefers to avoid surgery due to risk of further damage and potential increased disability. - Administer pelvic floor injections on Friday with GYN scheduled  - Prescribe refills for  oxycodone 15 mg every 8 hours  - Advise to confirm medication availability with pharmacy

## 2023-04-05 ENCOUNTER — Other Ambulatory Visit: Payer: Self-pay | Admitting: Family Medicine

## 2023-04-05 DIAGNOSIS — F419 Anxiety disorder, unspecified: Secondary | ICD-10-CM

## 2023-04-06 NOTE — Telephone Encounter (Signed)
 Requested medication (s) are due for refill today - yes  Requested medication (s) are on the active medication list -yes  Future visit scheduled -no  Last refill: 02/07/23 #60   Notes to clinic: non delegated Rx  Requested Prescriptions  Pending Prescriptions Disp Refills   clonazePAM (KLONOPIN) 0.5 MG tablet [Pharmacy Med Name: CLONAZEPAM 0.5 MG TAB[!]] 60 tablet 0    Sig: TAKE ONE TABLET BY MOUTH TWICE A DAY AS NEEDED     Not Delegated - Psychiatry: Anxiolytics/Hypnotics 2 Failed - 04/06/2023 12:59 PM      Failed - This refill cannot be delegated      Failed - Urine Drug Screen completed in last 360 days      Passed - Patient is not pregnant      Passed - Valid encounter within last 6 months    Recent Outpatient Visits           1 month ago Hypothyroidism (acquired)   [place] [facility] Simmons-Robinson, [place], MD   4 months ago Anxiety   [place] [facility] Simmons-Robinson, [place], MD   5 months ago Anxiety   [place] [facility] Simmons-Robinson, [place], MD   1 year ago Frequent urination   [place] [facility] [place], [place], MD   1 year ago B12 deficiency   [place] [facility] Simmons-Robinson, [place], MD                 Requested Prescriptions  Pending Prescriptions Disp Refills   clonazePAM (KLONOPIN) 0.5 MG tablet [Pharmacy Med Name: CLONAZEPAM 0.5 MG TAB[!]] 60 tablet 0    Sig: TAKE ONE TABLET BY MOUTH TWICE A DAY AS NEEDED     Not Delegated - Psychiatry: Anxiolytics/Hypnotics 2 Failed - 04/06/2023 12:59 PM      Failed - This refill cannot be delegated      Failed - Urine Drug Screen completed in last 360 days      Passed - Patient is not pregnant      Passed - Valid encounter within last 6 months    Recent Outpatient Visits           1 month ago Hypothyroidism (acquired)   [place] [facility] Simmons-Robinson,  [place], MD   4 months ago Anxiety   [place] [facility] Simmons-Robinson, [place], MD   5 months ago Anxiety   [place] [facility] [place], [place], MD   1 year ago Frequent urination   [place] [facility] [place], [place], MD   1 year ago B12 deficiency   [place] [facility] [place], [place], MD

## 2023-04-12 ENCOUNTER — Other Ambulatory Visit: Payer: Self-pay | Admitting: Family Medicine

## 2023-04-12 DIAGNOSIS — G894 Chronic pain syndrome: Secondary | ICD-10-CM

## 2023-04-12 NOTE — Telephone Encounter (Signed)
 [facility] faxed refill request for the following medications:   oxyCODONE (ROXICODONE) 15 MG immediate release tablet     Please advise.

## 2023-04-13 MED ORDER — OXYCODONE HCL 15 MG PO TABS: ORAL_TABLET | ORAL | 0 refills | Status: DC

## 2023-04-13 NOTE — Telephone Encounter (Signed)
 LOV 1*28*25 NOV 5*22*25 [facility] [id number] LABS [id number]

## 2023-04-21 ENCOUNTER — Other Ambulatory Visit: Payer: Self-pay | Admitting: Family Medicine

## 2023-04-21 DIAGNOSIS — F419 Anxiety disorder, unspecified: Secondary | ICD-10-CM

## 2023-04-21 NOTE — Telephone Encounter (Signed)
 [facility] is requesting refill methylphenidate (RITALIN) 10 MG tablet  Please advise

## 2023-04-24 MED ORDER — METHYLPHENIDATE HCL 10 MG PO TABS: ORAL_TABLET | ORAL | 0 refills | Status: DC

## 2023-05-09 ENCOUNTER — Other Ambulatory Visit: Payer: Self-pay | Admitting: Family Medicine

## 2023-05-09 DIAGNOSIS — E7800 Pure hypercholesterolemia, unspecified: Secondary | ICD-10-CM

## 2023-05-09 MED ORDER — ROSUVASTATIN CALCIUM 10 MG PO TABS
10.0000 mg | ORAL_TABLET | Freq: Every day | ORAL | 3 refills | Status: AC
Start: 2023-05-09 — End: ?

## 2023-05-09 NOTE — Telephone Encounter (Signed)
 [facility] faxed refill request for the following medications:   rosuvastatin (CRESTOR) 10 MG tablet    Please advise.

## 2023-05-11 ENCOUNTER — Other Ambulatory Visit: Payer: Self-pay | Admitting: Family Medicine

## 2023-05-11 DIAGNOSIS — F419 Anxiety disorder, unspecified: Secondary | ICD-10-CM

## 2023-05-11 NOTE — Telephone Encounter (Signed)
 [facility] faxed refill request for the following medications:   clonazePAM (KLONOPIN) 0.5 MG tablet    Please advise.

## 2023-05-12 MED ORDER — CLONAZEPAM 0.5 MG PO TABS: 0.5000 mg | ORAL_TABLET | Freq: Two times a day (BID) | ORAL | 0 refills | Status: AC | PRN

## 2023-05-29 ENCOUNTER — Other Ambulatory Visit: Payer: Self-pay | Admitting: Family Medicine

## 2023-05-29 DIAGNOSIS — G894 Chronic pain syndrome: Secondary | ICD-10-CM

## 2023-05-29 NOTE — Telephone Encounter (Signed)
 Called but was only able to leave a voice message asking the pt to contact the office back regarding the medication request. Medication was last filled 3/25 with 80 tablets, please clarify if the pt was dispensed 80 tablets?

## 2023-05-29 NOTE — Telephone Encounter (Signed)
 [facility] faxed refill request for the following medications:   oxyCODONE (ROXICODONE) 15 MG immediate release tablet     Please advise.

## 2023-05-30 NOTE — Addendum Note (Signed)
 Addended by: Bart Lieu on: 05/30/2023 04:00 PM   Modules accepted: Orders

## 2023-05-30 NOTE — Telephone Encounter (Signed)
 Copied from CRM [id number]. Topic: General - Other >> May 30, 2023  8:26 AM Marissa P wrote: Reason for CRM: Patient called back regarding message from Bart Lieu, CMA, would like another call regarding this please. I attempted to call [facility] twice no answer. Callback # is [phone].

## 2023-05-31 MED ORDER — OXYCODONE HCL 15 MG PO TABS: ORAL_TABLET | ORAL | 0 refills | Status: AC

## 2023-06-01 ENCOUNTER — Other Ambulatory Visit: Payer: Self-pay | Admitting: Family Medicine

## 2023-06-01 DIAGNOSIS — G894 Chronic pain syndrome: Secondary | ICD-10-CM

## 2023-06-01 NOTE — Telephone Encounter (Signed)
 Copied from CRM [id number]. Topic: Clinical - Medication Refill >> Jun 01, 2023  3:27 PM Rosaria Common wrote: Most Recent Primary Care Visit:  Provider: SIMMONS-ROBINSON, MAKIERA  Department: [id number]  Visit Type: OFFICE VISIT  Date: 03/07/2023  Medication: oxyCODONE  (ROXICODONE ) 15 MG immediate release tablet   Has the patient contacted their pharmacy? Yes (Agent: If no, request that the patient contact the pharmacy for the refill. If patient does not wish to contact the pharmacy document the reason why and proceed with request.) (Agent: If yes, when and what did the pharmacy advise?)  Is this the correct pharmacy for this prescription? Yes If no, delete pharmacy and type the correct one.  This is the patient's preferred pharmacy:  [facility] [address] [facility] - [place], [place] - [address] [place] [address] Phone: [phone] Fax: [phone]     Has the prescription been filled recently? Yes  Is the patient out of the medication? Yes  Has the patient been seen for an appointment in the last year OR does the patient have an upcoming appointment? Yes  Can we respond through MyChart? No  Agent: Please be advised that Rx refills may take up to 3 business days. We ask that you follow-up with your pharmacy.

## 2023-06-02 NOTE — Telephone Encounter (Signed)
 Requested medication (s) are due for refill today: No  Requested medication (s) are on the active medication list: Yes  Last refill:  05/31/23  Future visit scheduled: Yes  Notes to clinic:  Not delegated.    Requested Prescriptions  Pending Prescriptions Disp Refills   oxyCODONE  (ROXICODONE ) 15 MG immediate release tablet 80 tablet 0    Sig: TAKE ONE TABLET BY MOUTH EVERY 8 HOURS AS NEEDED FOR PAIN     Not Delegated - Analgesics:  Opioid Agonists Failed - 06/02/2023 11:52 AM      Failed - This refill cannot be delegated      Failed - Urine Drug Screen completed in last 360 days      Failed - Valid encounter within last 3 months    Recent Outpatient Visits   None

## 2023-06-02 NOTE — Telephone Encounter (Signed)
 Patient called to check status of request. Per chart medication was sent 4/23. Confirmed pharmacy correct. Patient not happy about request being denied due to her being a long time patient and has been on the medication for year. Thank You

## 2023-06-03 ENCOUNTER — Other Ambulatory Visit: Payer: Self-pay | Admitting: Family Medicine

## 2023-06-03 DIAGNOSIS — K219 Gastro-esophageal reflux disease without esophagitis: Secondary | ICD-10-CM

## 2023-06-06 ENCOUNTER — Other Ambulatory Visit: Payer: Self-pay | Admitting: Family Medicine

## 2023-06-06 DIAGNOSIS — F419 Anxiety disorder, unspecified: Secondary | ICD-10-CM

## 2023-06-06 NOTE — Telephone Encounter (Signed)
 LOV 1*28*25 NOV 5*22*25 LRF [id number] LABS [id number]

## 2023-06-06 NOTE — Telephone Encounter (Signed)
 Requested Prescriptions  Pending Prescriptions Disp Refills   omeprazole  (PRILOSEC) 20 MG capsule [Pharmacy Med Name: OMEPRAZOLE  20 MG CAP[*]] 90 capsule 1    Sig: TAKE ONE CAPSULE BY MOUTH ONE TIME DAILY     Gastroenterology: Proton Pump Inhibitors Failed - 06/06/2023  8:17 AM      Failed - Valid encounter within last 12 months    Recent Outpatient Visits   None

## 2023-06-06 NOTE — Telephone Encounter (Signed)
 [facility] is requesting refill methylphenidate (RITALIN) 10 MG tablet  Please advise

## 2023-06-07 MED ORDER — METHYLPHENIDATE HCL 10 MG PO TABS: ORAL_TABLET | ORAL | 0 refills | Status: AC

## 2023-06-13 DIAGNOSIS — Z1231 Encounter for screening mammogram for malignant neoplasm of breast: Secondary | ICD-10-CM | POA: Diagnosis not present

## 2023-06-13 DIAGNOSIS — Z1239 Encounter for other screening for malignant neoplasm of breast: Secondary | ICD-10-CM | POA: Diagnosis not present

## 2023-06-14 DIAGNOSIS — G43109 Migraine with aura, not intractable, without status migrainosus: Secondary | ICD-10-CM | POA: Diagnosis not present

## 2023-06-20 DIAGNOSIS — M6289 Other specified disorders of muscle: Secondary | ICD-10-CM | POA: Diagnosis not present

## 2023-06-20 DIAGNOSIS — G894 Chronic pain syndrome: Secondary | ICD-10-CM | POA: Diagnosis not present

## 2023-06-29 ENCOUNTER — Encounter: Payer: Self-pay | Admitting: Family Medicine

## 2023-07-14 DIAGNOSIS — K5904 Chronic idiopathic constipation: Secondary | ICD-10-CM | POA: Diagnosis not present

## 2023-07-14 DIAGNOSIS — K219 Gastro-esophageal reflux disease without esophagitis: Secondary | ICD-10-CM | POA: Diagnosis not present

## 2023-07-14 DIAGNOSIS — R110 Nausea: Secondary | ICD-10-CM | POA: Diagnosis not present

## 2023-07-14 DIAGNOSIS — Z1211 Encounter for screening for malignant neoplasm of colon: Secondary | ICD-10-CM | POA: Diagnosis not present

## 2023-07-14 DIAGNOSIS — R634 Abnormal weight loss: Secondary | ICD-10-CM | POA: Diagnosis not present

## 2023-07-14 DIAGNOSIS — K5903 Drug induced constipation: Secondary | ICD-10-CM | POA: Diagnosis not present

## 2023-09-01 ENCOUNTER — Other Ambulatory Visit: Payer: Self-pay | Admitting: Family Medicine

## 2023-09-01 DIAGNOSIS — K219 Gastro-esophageal reflux disease without esophagitis: Secondary | ICD-10-CM

## 2023-09-04 NOTE — Telephone Encounter (Signed)
 Rx 06/06/23 #90 1RF Requested Prescriptions  Pending Prescriptions Disp Refills   omeprazole  (PRILOSEC) 20 MG capsule [Pharmacy Med Name: OMEPRAZOLE  20 MG CAP[*]] 90 capsule 1    Sig: TAKE ONE CAPSULE BY MOUTH ONE TIME DAILY     Gastroenterology: Proton Pump Inhibitors Failed - 09/04/2023 12:57 PM      Failed - Valid encounter within last 12 months    Recent Outpatient Visits   None

## 2023-09-12 DIAGNOSIS — D127 Benign neoplasm of rectosigmoid junction: Secondary | ICD-10-CM | POA: Diagnosis not present

## 2023-09-12 DIAGNOSIS — K5730 Diverticulosis of large intestine without perforation or abscess without bleeding: Secondary | ICD-10-CM | POA: Diagnosis not present

## 2023-09-12 DIAGNOSIS — Z1211 Encounter for screening for malignant neoplasm of colon: Secondary | ICD-10-CM | POA: Diagnosis not present

## 2023-09-12 DIAGNOSIS — K642 Third degree hemorrhoids: Secondary | ICD-10-CM | POA: Diagnosis not present

## 2023-09-12 DIAGNOSIS — K219 Gastro-esophageal reflux disease without esophagitis: Secondary | ICD-10-CM | POA: Diagnosis not present

## 2023-09-12 DIAGNOSIS — E785 Hyperlipidemia, unspecified: Secondary | ICD-10-CM | POA: Diagnosis not present

## 2023-09-12 DIAGNOSIS — K635 Polyp of colon: Secondary | ICD-10-CM | POA: Diagnosis not present

## 2023-09-12 DIAGNOSIS — Z882 Allergy status to sulfonamides status: Secondary | ICD-10-CM | POA: Diagnosis not present

## 2023-09-12 DIAGNOSIS — E039 Hypothyroidism, unspecified: Secondary | ICD-10-CM | POA: Diagnosis not present

## 2023-09-12 DIAGNOSIS — K6389 Other specified diseases of intestine: Secondary | ICD-10-CM | POA: Diagnosis not present

## 2023-09-25 NOTE — Progress Notes (Signed)
 Patient presents for cataract evaluation referred by Dr. Charlie Forte, M.D.  # Age-related nuclear sclerotic cataract Both eyes - Discussed risks and benefits of surgery - Patient would like to postpone cataract surgery at this time - She would like to try glasses to improve vision for now  # Monocular Diplopia - possibly cataract related  PLAN - Dispensed Rx today -RTC 4yr   I discussed the above diagnoses listed in the Impression and Plan with the patient/parent(s). I personally performed and evaluated this patient as documented by the resident/fellow/technician entries and reconciled when needed. The Chief Complaint and HPI as documented was explored in detail and no additional concerns were reported. I have reviewed past medical history, family history, social history, review of symptoms, medications and allergies as documented in the patient's electronic medical record and agree or have revised as indicated. I performed medication reconciliation if medications were prescribed or dosages adjusted. I developed the management plan and counseled the patient/parents on treatment options as outlined above.  Lloyd B. Trudy, MD, PhD

## 2023-10-16 NOTE — Progress Notes (Signed)
 [facility] Division of Minimally Invasive Gynecologic Surgery  Date: October 16, 2023 Patient Name: Hannah Vance MRN: [id number] PCP: Bertrum Charlie Raring Referring Provider: Self, Referred  Subjective:     HPI: [name] is a 68 y.o. female G1P0 seen in follow up for the management of her chronic pelvic pain. She has a long history of LLQ pain. Last visit was on 03/10/23. She had improvement after the trigger point injections into the bilateral levator ani muscles and presents approximately q 6 months. She would like to do those again today.   She rates her least 10/10, worst 5/10 and average pain 7/10. She describes her pain as dull, burning, nauseating, sharp, shooting, stabbing, throbbing. Constant pain.    Since her last visit she had her colonoscopy and is considering starting linzess for her IBS symptoms.    Comorbid pain disorder: anxiety, migraines, low back pain, myofascial pelvic pain  Current pain regimen: Flexeril  5-10 mg po tid prn pain-helpful Oxycodone  15 mg one daily prn pain- 09/20/23 last fill from her PCP  Bladder: Stable. No urinary frequency, urgency. She currently feels like she empties her bladder.   Bowels: Posterior vaginal wall prolapse and has discomfort when her rectum is full+ splints to evacuate the rectum. She has the sensation of something falling out but this is stable. She uses herbal tea, magnesium, and miralax. She has added peppermint tea which has been helpful.  Exercise: Housework.  Sleep: She typically goes to bed around 7-8 pm. She uses her clonazepam  at bed. She wakes 3-4 times to empty her bladder at night.   SH: Her mother passed away 11-19-2020.   Pain History: Her pain started approximately  In 1999.  Pain is constant and localized to LLQ.  She is not sexually active.  Pain is made worse with standing, walking and sitting in one position for an extended period of time.   Pain is alleviated by bowel movements, laxatives,  heating pads, hot baths, TENS unit, PT, bladder emptying, pain medications, ice, propping up on pillows, and changing positions.  Treatments tried: cymbalta, amitriptyline, gabapentin , lyrica, physical therapy, opioids, peppermint oil tea, flexeril , vaginal estrogen, lidocaine trigger point injections for LLQ myofascial abdominal wall pain, pelvic floor pt (limited due to pain), TENS unity, heating pad, vagifem (vaginal sores). She was seeing a PT in [place], but said she 'didn't feel wanted' and so she stopped attending PT. Levator ani trigger point injection 11/2018, 08/2020, flexeril     Past Medical History:  Diagnosis Date  . Depression   . Endometriosis   . Fibroid   . Hypothyroidism   . Kidney stones   . Migraine   . Mitral valve prolapse     Past Surgical History:  Procedure Laterality Date  . ANTERIOR FUSION CERVICAL SPINE  12/09/2007   graft from left iliac crest  . BREAST CYST ASPIRATION Left    abcess drained while breast feeding in her 20s  . HYSTERECTOMY  02/07/1997   TAH/BSO for prolapsed uterus/endometriosis  . OOPHORECTOMY    . PR COLONOSCOPY W/BIOPSY SINGLE/MULTIPLE N/A 09/12/2023   Procedure: COLONOSCOPY, FLEXIBLE, PROXIMAL TO SPLENIC FLEXURE; WITH BIOPSY, SINGLE OR MULTIPLE;  Surgeon: Rubie Clem Art, MD;  [facility];  Service: Gastroenterology  . PR COLSC FLX W/RMVL OF TUMOR POLYP LESION SNARE TQ N/A 02/22/2022   Procedure: COLONOSCOPY FLEX; W/REMOV TUMOR/LES BY SNARE;  Surgeon: Celestina Comer Norris, MD;  Location: [facility];  Service: Gastroenterology  . PR EXCIS VAGINAL CYST/TUMOR Left  11/20/2018   Procedure: EXCISION OF VAGINAL CYST OR TUMOR;  Surgeon: Rocky Werner Maffucci, MD;  Location: [facility];  Service: Advanced Laparoscopy  . PR INJECT TRIGGER POINT, 1 OR 2 Bilateral 11/20/2018   Procedure: TRIGGER POINT INJECTION;  Surgeon: Rocky Werner Maffucci, MD;  Location[facility];   Service: Advanced Laparoscopy  . PR PELVIC EXAMINATION W ANESTH N/A 11/20/2018   Procedure: R22 PELVIC EXAMINATION UNDER ANESTHESIA (OTHER THAN LOCAL);  Surgeon: Rocky Werner Maffucci, MD;  Location: [facility];  Service: Advanced Laparoscopy  . PR UPPER GI ENDOSCOPY,BIOPSY N/A 02/22/2022   Procedure: UGI ENDOSCOPY; WITH BIOPSY, SINGLE OR MULTIPLE;  Surgeon: Celestina Comer Norris, MD;  [facility];  Service: Gastroenterology    OB History     Gravida  1   Para  1   Term  1   Preterm      AB      Living  1      SAB      IAB      Ectopic      Molar      Multiple      Live Births  1           Current Outpatient Medications:  .  baclofen (LIORESAL) 10 MG tablet, Take 1/2-1 po TID prn. May cause drowsiness., Disp: , Rfl:  .  bisacodyl (DULCOLAX) 5 mg EC tablet, Take 2 tablets (10 mg total) by mouth Take as directed. Take 2 tablets (10 mg total) as directed for bowel prep., Disp: 2 tablet, Rfl: 0 .  clonazePAM  (KLONOPIN ) 0.5 MG tablet, , Disp: , Rfl:  .  cyclobenzaprine  (FLEXERIL ) 5 MG tablet, Take 1 tablet (5 mg total) by mouth Three (3) times a day as needed for muscle spasms., Disp: , Rfl:  .  doxycycline  (VIBRAMYCIN ) 100 MG capsule, Take 1 capsule (100 mg total) by mouth two (2) times a day. (Patient not taking: Reported on 09/12/2023), Disp: , Rfl:  .  levothyroxine  (SYNTHROID , LEVOTHROID) 25 MCG tablet, Take 1 tablet (25 mcg total) by mouth in the morning., Disp: , Rfl: 5 .  linaclotide (LINZESS) 145 mcg capsule, Take 1 capsule on an empty stomach at least 30 minutes prior to a meal at the same time every day. (Patient not taking: Reported on 09/12/2023), Disp: 90 capsule, Rfl: 3 .  loratadine  (CLARITIN ) 10 mg tablet, Take 1 tablet (10 mg total) by mouth daily as needed., Disp: , Rfl:  .  MAGNESIUM ORAL, Take 500 mg by mouth daily., Disp: , Rfl:  .  methylphenidate  (RITALIN ) 10 MG tablet, Take 1 tablet (10 mg total) by mouth two  (2) times a day. Patient actually takes once daily., Disp: , Rfl: 0 .  naloxone  (NARCAN ) 4 mg nasal spray, 1 spray (4 mg total)., Disp: , Rfl:  .  omeprazole  (PRILOSEC) 40 MG capsule, Take 1 capsule (40 mg total) by mouth Two (2) times a day (30 minutes before a meal)., Disp: 200 capsule, Rfl: 3 .  ondansetron  (ZOFRAN -ODT) 4 MG disintegrating tablet, Dissolve 2 tablets (8 mg total) in the mouth every eight (8) hours as needed for nausea for up to 2 doses., Disp: 4 tablet, Rfl: 0 .  oxyCODONE  (ROXICODONE ) 15 MG immediate release tablet, Take 1 tablet (15 mg total) by mouth every six (6) hours. q 4-6 prn, Disp: , Rfl:  .  polyethylene glycol (CLEARLAX) 17 gram/dose powder, Take as directed for extended bowel prep., Disp:  238 g, Rfl: 0 .  polyethylene glycol (GLYCOLAX) 17 gram/dose powder, Take 17 g by mouth daily., Disp: 255 g, Rfl: 0 .  rosuvastatin  (CRESTOR ) 10 MG tablet, Take 1 tablet (10 mg total) by mouth in the morning., Disp: , Rfl:  .  SUMAtriptan  (IMITREX ) 100 MG tablet, Take 1 tablet (100 mg total) by mouth every two (2) hours as needed., Disp: , Rfl:  .  vortioxetine  (TRINTELLIX ) 20 mg tablet, Take 1 tablet (20 mg total) by mouth in the morning., Disp: , Rfl:  Current Outpatient Medications  Medication Sig Dispense Refill  . baclofen (LIORESAL) 10 MG tablet Take 1/2-1 po TID prn. May cause drowsiness.    . bisacodyl (DULCOLAX) 5 mg EC tablet Take 2 tablets (10 mg total) by mouth Take as directed. Take 2 tablets (10 mg total) as directed for bowel prep. 2 tablet 0  . clonazePAM  (KLONOPIN ) 0.5 MG tablet     . cyclobenzaprine  (FLEXERIL ) 5 MG tablet Take 1 tablet (5 mg total) by mouth Three (3) times a day as needed for muscle spasms.    . doxycycline  (VIBRAMYCIN ) 100 MG capsule Take 1 capsule (100 mg total) by mouth two (2) times a day. (Patient not taking: Reported on 09/12/2023)    . levothyroxine  (SYNTHROID , LEVOTHROID) 25 MCG tablet Take 1 tablet (25 mcg total) by mouth in the morning.  5   . linaclotide (LINZESS) 145 mcg capsule Take 1 capsule on an empty stomach at least 30 minutes prior to a meal at the same time every day. (Patient not taking: Reported on 09/12/2023) 90 capsule 3  . loratadine  (CLARITIN ) 10 mg tablet Take 1 tablet (10 mg total) by mouth daily as needed.    SABRA MAGNESIUM ORAL Take 500 mg by mouth daily.    . methylphenidate  (RITALIN ) 10 MG tablet Take 1 tablet (10 mg total) by mouth two (2) times a day. Patient actually takes once daily.  0  . naloxone  (NARCAN ) 4 mg nasal spray 1 spray (4 mg total).    . omeprazole  (PRILOSEC) 40 MG capsule Take 1 capsule (40 mg total) by mouth Two (2) times a day (30 minutes before a meal). 200 capsule 3  . ondansetron  (ZOFRAN -ODT) 4 MG disintegrating tablet Dissolve 2 tablets (8 mg total) in the mouth every eight (8) hours as needed for nausea for up to 2 doses. 4 tablet 0  . oxyCODONE  (ROXICODONE ) 15 MG immediate release tablet Take 1 tablet (15 mg total) by mouth every six (6) hours. q 4-6 prn    . polyethylene glycol (CLEARLAX) 17 gram/dose powder Take as directed for extended bowel prep. 238 g 0  . polyethylene glycol (GLYCOLAX) 17 gram/dose powder Take 17 g by mouth daily. 255 g 0  . rosuvastatin  (CRESTOR ) 10 MG tablet Take 1 tablet (10 mg total) by mouth in the morning.    . SUMAtriptan  (IMITREX ) 100 MG tablet Take 1 tablet (100 mg total) by mouth every two (2) hours as needed.    . vortioxetine  (TRINTELLIX ) 20 mg tablet Take 1 tablet (20 mg total) by mouth in the morning.     No current facility-administered medications for this visit.   Allergies  Allergen Reactions  . Amoxicillin  Other (See Comments)    Other reaction(s): Other (See Comments) Other reaction(s): Other (See Comments)  . Morphine Other (See Comments)    Trouble walking, heavy legs  . Quetiapine Other (See Comments)    Mouth and tongue numbness  . Quetiapine Fumarate  Other reaction(s): Other (See Comments) Mouth and tongue numbness   . Sulfa  (Sulfonamide Antibiotics) Other (See Comments)    burning  . Sulfamethoxazole-Trimethoprim   . Methadone Nausea And Vomiting    Other reaction(s): Nausea And Vomiting   Family History  Problem Relation Age of Onset  . Rheum arthritis Mother   . Asthma Mother   . Hypertension Mother   . Stroke Mother   . Kidney failure Mother   . Rheum arthritis Father   . Osteoarthritis Father   . Hypertension Father   . Heart failure Father   . Alzheimer's disease Father        died age 18  . Thyroid  disease Sister   . Osteoarthritis Sister   . Polycystic kidney disease Brother   . Cancer Maternal Grandmother     Social History   Socioeconomic History  . Marital status: Divorced  . Number of children: 1  Tobacco Use  . Smoking status: Some Days    Types: Cigarettes  . Smokeless tobacco: Never  Vaping Use  . Vaping status: Never Used  Substance and Sexual Activity  . Alcohol use: No  . Drug use: No  . Sexual activity: Never  Social History Narrative   Divorced.  On disability      Social Drivers of Corporate investment banker Strain: Low Risk  (06/20/2023)   Received from [facility]   Overall Financial Resource Strain (CARDIA)   . Difficulty of Paying Living Expenses: Not hard at all  Food Insecurity: No Food Insecurity (06/20/2023)   Received from [facility]   Hunger Vital Sign   . Within the past 12 months, you worried that your food would run out before you got the money to buy more.: Never true   . Within the past 12 months, the food you bought just didn't last and you didn't have money to get more.: Never true  Transportation Needs: No Transportation Needs (06/20/2023)   Received from [facility]   [facility] - Transportation   . In the past 12 months, has lack of transportation kept you from medical appointments or from getting medications?: No   . Lack of Transportation (Non-Medical): No  Physical Activity: Unknown  (10/07/2022)   Received from [facility]   Exercise Vital Sign   . On average, how many days per week do you engage in moderate to strenuous exercise (like a brisk walk)?: 0 days  Stress: Stress Concern Present (10/07/2022)   Received from [facility] - Occupational Stress Questionnaire   . Feeling of Stress : To some extent  Social Connections: Socially Isolated (10/07/2022)   Received from [facility]   Social Connection and Isolation Panel   . In a typical week, how many times do you talk on the phone with family, friends, or neighbors?: More than three times a week   . How often do you get together with friends or relatives?: Once a week   . How often do you attend church or religious services?: Never   . Do you belong to any clubs or organizations such as church groups, unions, fraternal or athletic groups, or school groups?: No   . Are you married, widowed, divorced, separated, never married, or living with a partner?: Divorced  Housing: Low Risk  (06/20/2023)   Received from [facility]   Housing Stability Vital Sign   . In the last 12  months, was there a time when you were not able to pay the mortgage or rent on time?: No   . In the past 12 months, how many times have you moved where you were living?: 0   . At any time in the past 12 months, were you homeless or living in a shelter (including now)?: No    The following portions of the patient's history were reviewed and updated as appropriate: allergies, current medications, past family history, past medical history, past social history, past surgical history and problem list. Objective:    Vital Signs for this encounter: BMI: There is no height or weight on file to calculate BMI. There were no vitals taken for this visit. There were no vitals filed for this visit.   Constitutional: Well-developed, well-nourished female in no acute distress Neurological: Alert and  oriented to person, place, and time Psychiatric: Mood and affect appropriate Skin: No rashes or lesions  Genitourinary:       External Genitalia: Normal female genitalia  Vulva:normal vulvar architecture.   No erythema or allodynia.    Urethral Meatus: Normal caliber and position   Urethra: Midline, no masses nontender   Bladder: Well-suspended, nontender  Vagina:  Normally rugated, no lesions.    Cervix: absent   Uterus: absent   Muscle Right Left Reproduces pain?  Levator ++ +++ L> R yes  Obturator + ++ yes  Piriformis + + no    Perineum/Anus: No lesions  Pre-operative Diagnosis: pelvic floor pain, myalgias, hypertonic pelvic floor/truncal muscles Post-operative Diagnosis: Same  Procedure:  Trigger point injection of the levator ani muscles bilaterally with a total of 9 ml of 0.25% bupivicaine + 40 mg kenalog   Detail: The risks, benefits, and alternatives of the procedure were discussed with the patient and informed consent was obtained. The patient was placed in lithotomy position. The vagina and perineum prepped with betadine x 3.   A 25 gauge needle was then used and a total of 10 ml of injectate (9 ml of 0.25% bupivicaine + 40 mg kenalog ) was injected into the levator ani muscle complex bilaterally (pubococcygeus and puborectalis). 5 ml each side after negative aspiration for heme.   All sharps were removed from the operative field. She was taken out of lithotomy position.  All instrument and needle counts were corrrect x 3. Patient tolerated the procedure well. Minimal blood loss.  Chaperone: Dwayne Pickle RN    Assessment and Plan:    Problem List Items Addressed This Visit   None   68 y.o. female G1P1 here for chronic pelvic pain, with clinical history and exam findings consistent with vaginal apex pain and pelvic floor muscle spasm/myofascial abdominal wall pain, genital atrophy.   1. Chronic pelvic pain: She has multiple pain generators contributing to her  complicated pain presentation. Introduced and discussed at length the idea of centralized pain.  She has significant centralized pain with chronic low back pain, neck pain, opioid dependence. She reports trialing multiple centrally active medications for pain. The only medications she feels that have worked are opioids and anxiety meds as well as trigger point injections for levator ani spasm. Her main concern today is management of the levator ani spasm-unstable. Trigger point injections performed today for symptom management, stable after injection.   2. Continue home pelvic floor PT.   3. Continue to work with her psychiatrist. We again reviewed coping methods for the management of her pain, especially in light of the recent loss of her mother.  4. RTC in person in 6 months for repeat trigger point injection if her pain returns. February in person. [facility] ok   I spent a total of 30 minutes in today's visit preparing to see the patient (review of tests and/or imaging, reviewing other documentation in the medical record), obtaining and/or reviewing separately obtained history, performing a medically appropriate examination and/or evaluation, counseling and educating the patient/family/caregiver, ordering medications, tests, or procedures, referring and communicating with other health care professionals  and documenting clinical information in the electronic health record. > 50% in face to face counseling.

## 2023-10-17 ENCOUNTER — Telehealth: Payer: Self-pay

## 2023-10-17 NOTE — Telephone Encounter (Signed)
 Called patient back and advised her of the recall on the medication. Patient reports that she is no longer our patient and that GI already took her off of the Sucralfate . She appreciate us  calling her. I have taken Dr.Robinson off as her PCP.

## 2023-10-17 NOTE — Telephone Encounter (Signed)
 Called patient, LMTCB-We received a letter from [facility]  of members identified as having had a prescription filled recently for Sucralfate  tablets, USP, 1 gram from [facility], [employer] at [place].  Recall affects: Sucralfate  Tablets 1 gram (100 count bottle) NDC: [address]-01 Lot Number/Expiration Date: All lots within expiry.  Product: Sucralfate  Tablets, USP, 1 gram (500 count bottle) NDC: [address]-94 Lot Number/Expiration Date: all lots within Expiry.  Please see letter from [facility] for more details. Have placed it in the scan basket.

## 2023-10-17 NOTE — Telephone Encounter (Signed)
 Patient said she was returning a call from [facility]. Per patient she has left the practice months ago and was not sure why someone would be calling her. Per note it was due to medication recall but before I could advise patient she said she think the office was calling her back

## 2023-10-27 IMAGING — US US ABDOMEN COMPLETE
1 series · 14 of 25 positions shown · non-contrast
Comparison: None Available.

CLINICAL DATA: Abdominal pain

EXAM:
ABDOMEN ULTRASOUND COMPLETE

[Series 1: us abdomen complete · 0.14mm/px · 14 of 69 slices shown]
[im 1/69]
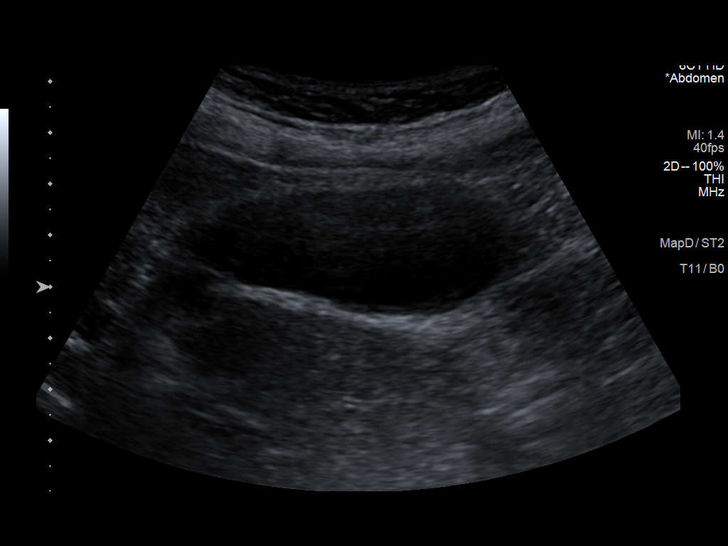
[im 6/69]
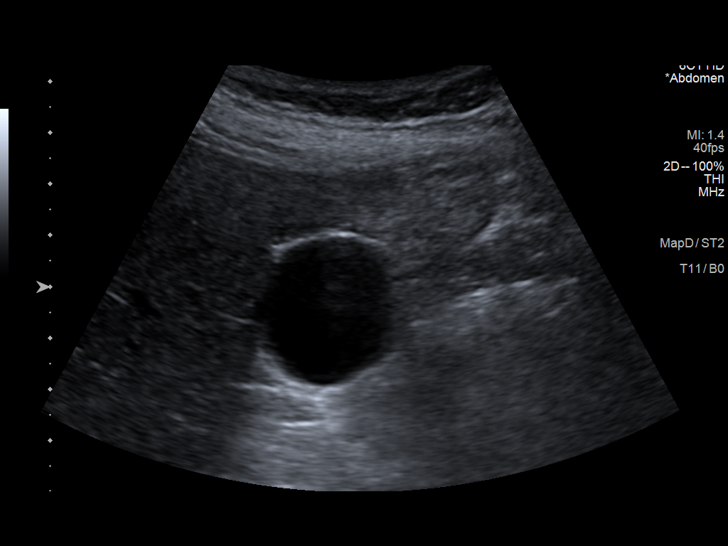
[im 12/69]
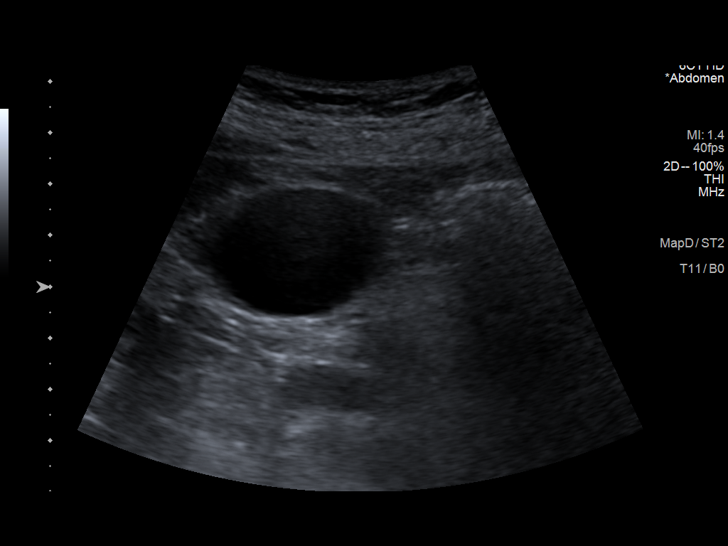
[im 18/69]
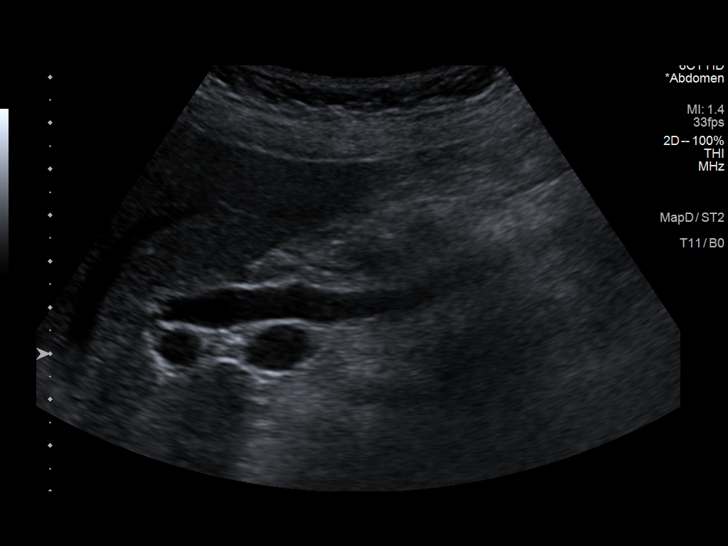
[im 23/69]
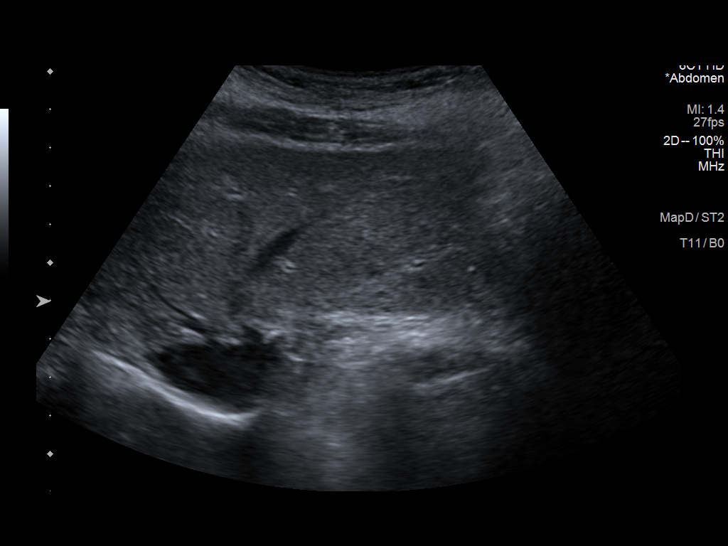
[im 26/69]
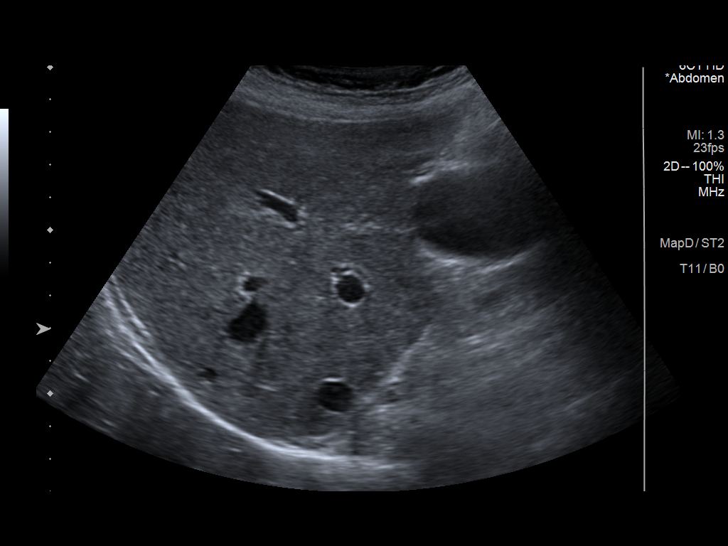
[im 32/69]
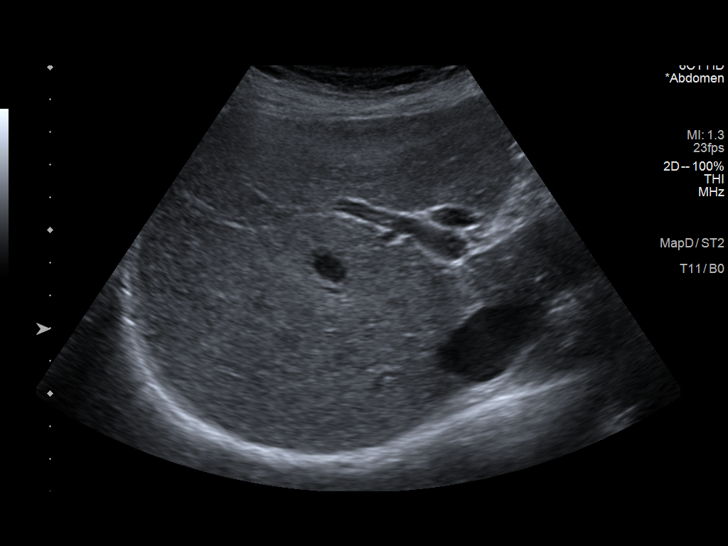
[im 37/69]
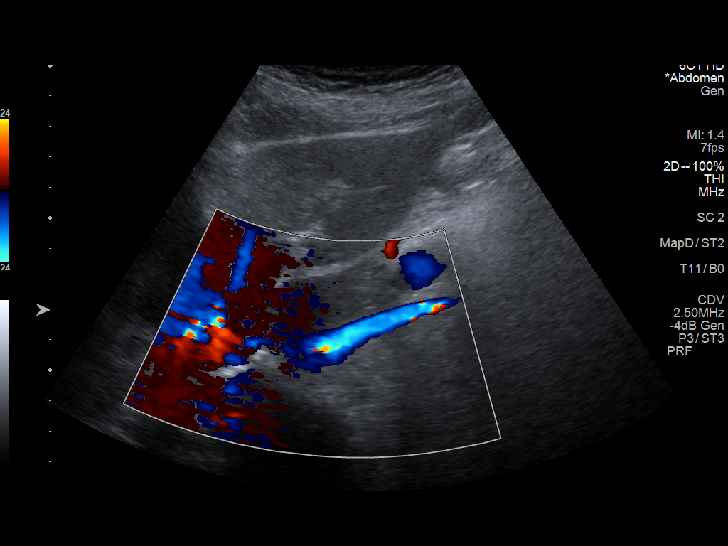
[im 43/69]
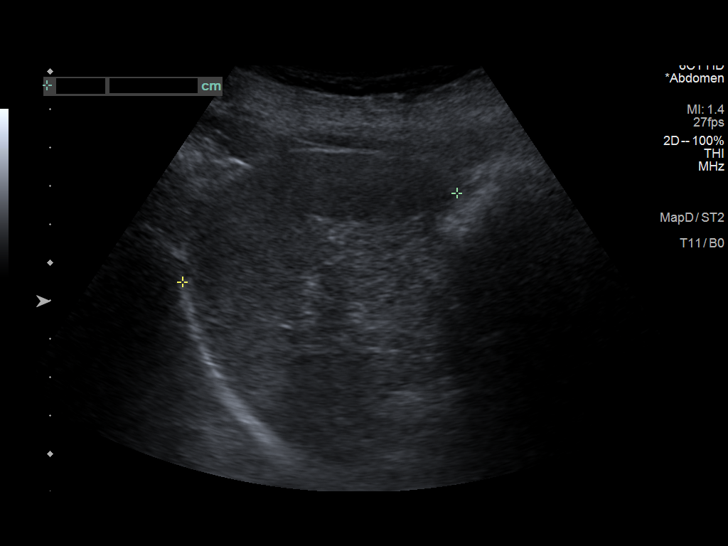
[im 46/69]
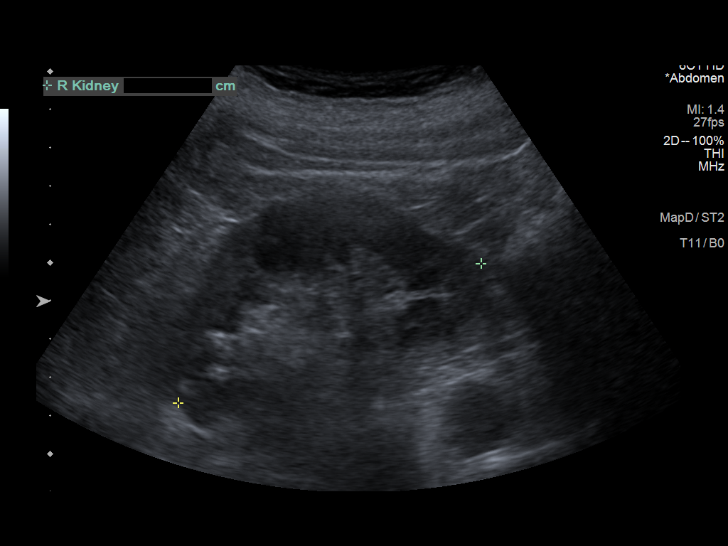
[im 52/69]
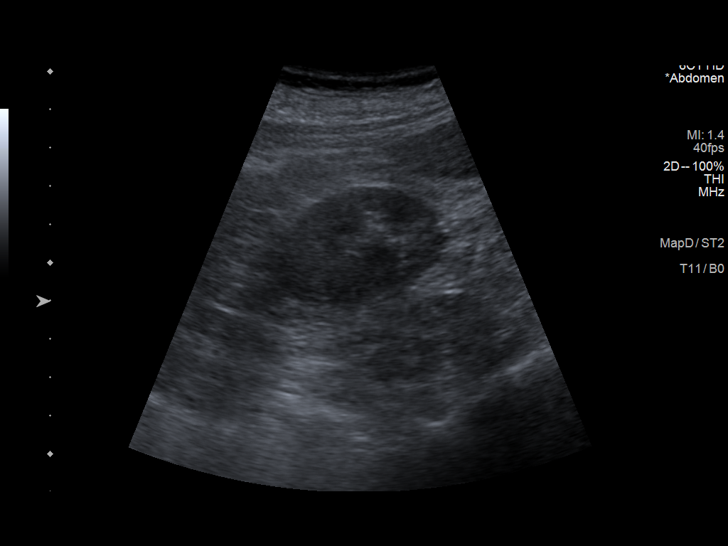
[im 57/69]
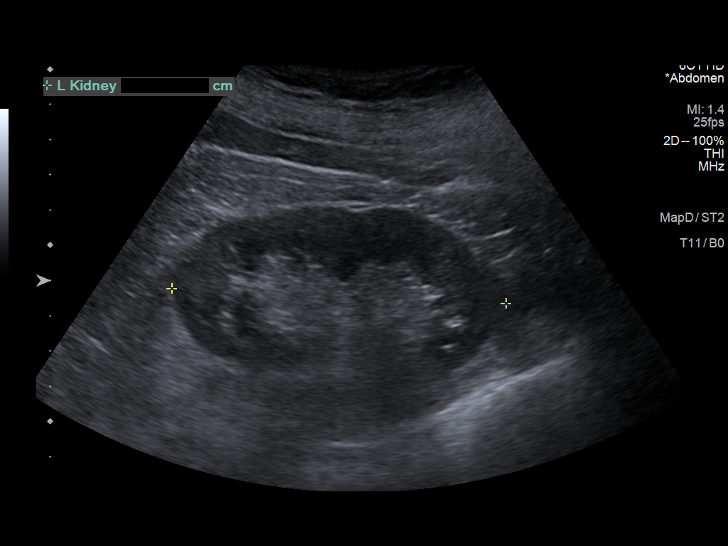
[im 63/69]
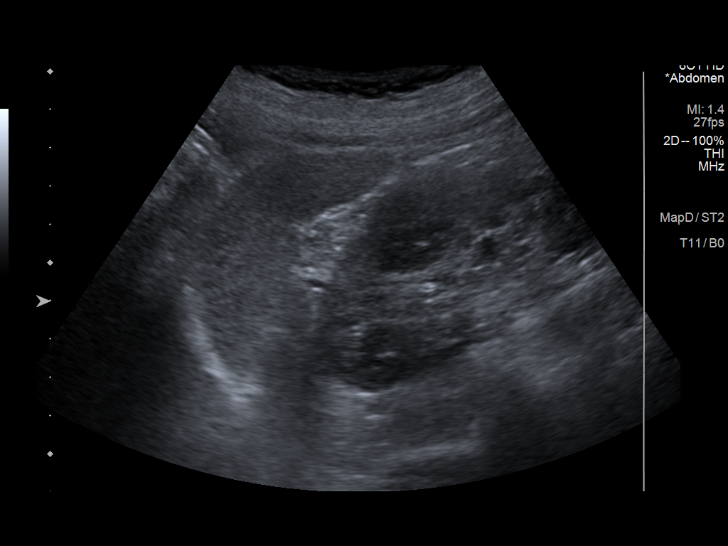
[im 69/69]
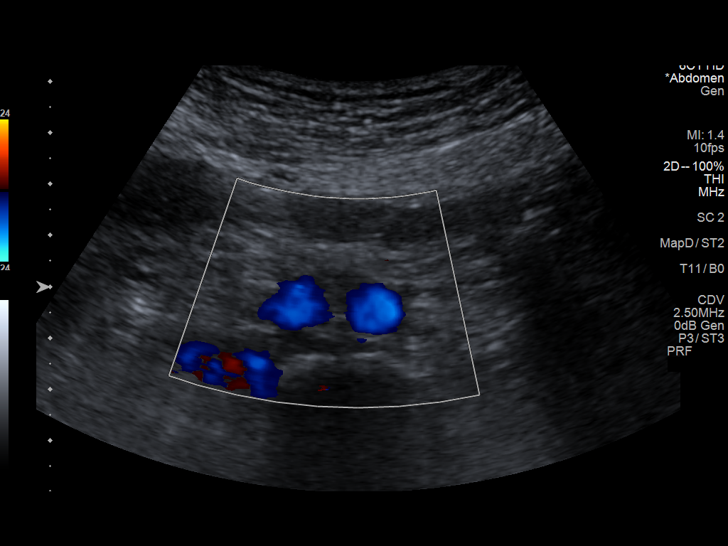

[14 of 25 positions shown; findings below may reference images not displayed]

FINDINGS: Gallbladder: No gallstones or wall thickening visualized. No
sonographic Murphy sign noted by sonographer.

Common bile duct: Diameter: 8 mm

Liver: No focal lesion identified. Within normal limits in
parenchymal echogenicity. Portal vein is patent on color Doppler
imaging with normal direction of blood flow towards the liver.

IVC: No abnormality visualized.

Pancreas: Visualized portion unremarkable.

Spleen: Size and appearance within normal limits.

Right Kidney: Length: 9.2 cm. Echogenicity within normal limits. No
mass or hydronephrosis visualized.

Left Kidney: Length: 9.5 cm. Echogenicity within normal limits.
cm anechoic cyst. No hydronephrosis visualized.

Abdominal aorta: No aneurysm visualized.

Other findings: None.
IMPRESSION: 1. Mildly dilated common bile duct. Correlate clinically and
consider follow-up MRCP if indicated.
2. Left renal cyst.

## 2023-11-14 ENCOUNTER — Other Ambulatory Visit: Payer: Self-pay | Admitting: Family Medicine

## 2023-11-16 DIAGNOSIS — K5909 Other constipation: Secondary | ICD-10-CM | POA: Diagnosis not present

## 2023-11-16 DIAGNOSIS — G43909 Migraine, unspecified, not intractable, without status migrainosus: Secondary | ICD-10-CM | POA: Diagnosis not present

## 2023-11-16 DIAGNOSIS — F321 Major depressive disorder, single episode, moderate: Secondary | ICD-10-CM | POA: Diagnosis not present

## 2023-11-16 DIAGNOSIS — Z23 Encounter for immunization: Secondary | ICD-10-CM | POA: Diagnosis not present

## 2023-11-30 ENCOUNTER — Other Ambulatory Visit: Payer: Self-pay | Admitting: Family Medicine

## 2023-11-30 DIAGNOSIS — K219 Gastro-esophageal reflux disease without esophagitis: Secondary | ICD-10-CM

## 2024-01-12 DIAGNOSIS — K5904 Chronic idiopathic constipation: Secondary | ICD-10-CM | POA: Diagnosis not present

## 2024-01-12 DIAGNOSIS — K219 Gastro-esophageal reflux disease without esophagitis: Secondary | ICD-10-CM | POA: Diagnosis not present

## 2024-01-12 DIAGNOSIS — K5903 Drug induced constipation: Secondary | ICD-10-CM | POA: Diagnosis not present

## 2024-01-26 DIAGNOSIS — S99911A Unspecified injury of right ankle, initial encounter: Secondary | ICD-10-CM | POA: Diagnosis not present

## 2024-01-26 DIAGNOSIS — S99921A Unspecified injury of right foot, initial encounter: Secondary | ICD-10-CM | POA: Diagnosis not present

## 2024-01-26 DIAGNOSIS — R519 Headache, unspecified: Secondary | ICD-10-CM | POA: Diagnosis not present

## 2024-01-26 DIAGNOSIS — R262 Difficulty in walking, not elsewhere classified: Secondary | ICD-10-CM | POA: Diagnosis not present

## 2024-01-26 DIAGNOSIS — M25571 Pain in right ankle and joints of right foot: Secondary | ICD-10-CM | POA: Diagnosis not present

## 2024-01-26 DIAGNOSIS — Z880 Allergy status to penicillin: Secondary | ICD-10-CM | POA: Diagnosis not present

## 2024-01-26 DIAGNOSIS — F32A Depression, unspecified: Secondary | ICD-10-CM | POA: Diagnosis not present

## 2024-01-26 DIAGNOSIS — M542 Cervicalgia: Secondary | ICD-10-CM | POA: Diagnosis not present

## 2024-01-26 DIAGNOSIS — K219 Gastro-esophageal reflux disease without esophagitis: Secondary | ICD-10-CM | POA: Diagnosis not present

## 2024-01-26 DIAGNOSIS — E785 Hyperlipidemia, unspecified: Secondary | ICD-10-CM | POA: Diagnosis not present

## 2024-01-26 DIAGNOSIS — S8991XA Unspecified injury of right lower leg, initial encounter: Secondary | ICD-10-CM | POA: Diagnosis not present

## 2024-01-26 DIAGNOSIS — W109XXA Fall (on) (from) unspecified stairs and steps, initial encounter: Secondary | ICD-10-CM | POA: Diagnosis not present

## 2024-01-26 DIAGNOSIS — M25562 Pain in left knee: Secondary | ICD-10-CM | POA: Diagnosis not present

## 2024-01-26 DIAGNOSIS — M25512 Pain in left shoulder: Secondary | ICD-10-CM | POA: Diagnosis not present

## 2024-01-26 DIAGNOSIS — E039 Hypothyroidism, unspecified: Secondary | ICD-10-CM | POA: Diagnosis not present

## 2024-01-26 DIAGNOSIS — Z90710 Acquired absence of both cervix and uterus: Secondary | ICD-10-CM | POA: Diagnosis not present

## 2024-01-26 DIAGNOSIS — Z882 Allergy status to sulfonamides status: Secondary | ICD-10-CM | POA: Diagnosis not present

## 2024-01-26 DIAGNOSIS — M7989 Other specified soft tissue disorders: Secondary | ICD-10-CM | POA: Diagnosis not present

## 2024-01-26 DIAGNOSIS — Z885 Allergy status to narcotic agent status: Secondary | ICD-10-CM | POA: Diagnosis not present

## 2024-01-26 DIAGNOSIS — N189 Chronic kidney disease, unspecified: Secondary | ICD-10-CM | POA: Diagnosis not present

## 2024-02-21 ENCOUNTER — Telehealth: Payer: Self-pay

## 2024-02-21 NOTE — Telephone Encounter (Signed)
 [facility] faxed refill request for the following medications:  methylphenidate  (RITALIN ) 10 MG tablet     Please advise.

## 2024-02-21 NOTE — Telephone Encounter (Signed)
 Not out patient
